# Patient Record
Sex: Female | Born: 1960 | Race: White | Hispanic: No | Marital: Married | Smoking: Former smoker
Health system: Southern US, Community
[De-identification: ages and names within clinical notes are randomized; demographics above are authoritative.]

## PROBLEM LIST (undated history)

## (undated) DIAGNOSIS — D689 Coagulation defect, unspecified: Secondary | ICD-10-CM

## (undated) DIAGNOSIS — E785 Hyperlipidemia, unspecified: Secondary | ICD-10-CM

## (undated) DIAGNOSIS — T7840XA Allergy, unspecified, initial encounter: Secondary | ICD-10-CM

## (undated) DIAGNOSIS — Z7901 Long term (current) use of anticoagulants: Secondary | ICD-10-CM

## (undated) DIAGNOSIS — K219 Gastro-esophageal reflux disease without esophagitis: Secondary | ICD-10-CM

## (undated) DIAGNOSIS — J45909 Unspecified asthma, uncomplicated: Secondary | ICD-10-CM

## (undated) DIAGNOSIS — M1990 Unspecified osteoarthritis, unspecified site: Secondary | ICD-10-CM

## (undated) DIAGNOSIS — R011 Cardiac murmur, unspecified: Secondary | ICD-10-CM

## (undated) DIAGNOSIS — Z972 Presence of dental prosthetic device (complete) (partial): Secondary | ICD-10-CM

## (undated) DIAGNOSIS — Z974 Presence of external hearing-aid: Secondary | ICD-10-CM

## (undated) DIAGNOSIS — I2699 Other pulmonary embolism without acute cor pulmonale: Secondary | ICD-10-CM

## (undated) DIAGNOSIS — H7293 Unspecified perforation of tympanic membrane, bilateral: Secondary | ICD-10-CM

## (undated) DIAGNOSIS — Z803 Family history of malignant neoplasm of breast: Secondary | ICD-10-CM

## (undated) DIAGNOSIS — I82409 Acute embolism and thrombosis of unspecified deep veins of unspecified lower extremity: Secondary | ICD-10-CM

## (undated) HISTORY — DX: Allergy, unspecified, initial encounter: T78.40XA

## (undated) HISTORY — DX: Unspecified perforation of tympanic membrane, bilateral: H72.93

## (undated) HISTORY — PX: FOOT SURGERY: SHX648

## (undated) HISTORY — DX: Hyperlipidemia, unspecified: E78.5

## (undated) HISTORY — DX: Long term (current) use of anticoagulants: Z79.01

## (undated) HISTORY — PX: COLONOSCOPY: SHX174

## (undated) HISTORY — PX: UPPER GASTROINTESTINAL ENDOSCOPY: SHX188

## (undated) HISTORY — PX: ESOPHAGOGASTRODUODENOSCOPY (EGD) WITH ESOPHAGEAL DILATION: SHX5812

## (undated) HISTORY — DX: Presence of dental prosthetic device (complete) (partial): Z97.2

## (undated) HISTORY — DX: Unspecified asthma, uncomplicated: J45.909

## (undated) HISTORY — DX: Unspecified osteoarthritis, unspecified site: M19.90

## (undated) HISTORY — PX: APPENDECTOMY: SHX54

## (undated) HISTORY — DX: Gastro-esophageal reflux disease without esophagitis: K21.9

## (undated) HISTORY — DX: Acute embolism and thrombosis of unspecified deep veins of unspecified lower extremity: I82.409

## (undated) HISTORY — PX: WRIST SURGERY: SHX841

## (undated) HISTORY — DX: Family history of malignant neoplasm of breast: Z80.3

## (undated) HISTORY — DX: Coagulation defect, unspecified: D68.9

## (undated) HISTORY — DX: Cardiac murmur, unspecified: R01.1

## (undated) HISTORY — PX: ABDOMINAL HYSTERECTOMY: SHX81

---

## 2009-11-06 HISTORY — PX: KNEE SURGERY: SHX244

## 2013-08-26 ENCOUNTER — Encounter: Payer: Self-pay | Admitting: General Practice

## 2013-08-26 ENCOUNTER — Ambulatory Visit (INDEPENDENT_AMBULATORY_CARE_PROVIDER_SITE_OTHER): Payer: Federal, State, Local not specified - PPO | Admitting: General Practice

## 2013-08-26 VITALS — BP 147/84 | HR 88 | Temp 97.3°F | Ht 69.5 in | Wt 176.0 lb

## 2013-08-26 DIAGNOSIS — Z7901 Long term (current) use of anticoagulants: Secondary | ICD-10-CM

## 2013-08-26 DIAGNOSIS — Z0000 Encounter for general adult medical examination without abnormal findings: Secondary | ICD-10-CM

## 2013-08-26 DIAGNOSIS — E785 Hyperlipidemia, unspecified: Secondary | ICD-10-CM

## 2013-08-26 NOTE — Progress Notes (Signed)
  Subjective:    Patient ID: Whitney Peters, female    DOB: [birth year], 52 y.o.   MRN: [id number]  HPI Patient presents today to establish care. She recently relocated from [place] to [place], [place] two weeks ago. She reports a history of asthma, hyperlipidemia, GERD, and pulmonary embolus. She reports currently taking warfarin 5mg  daily (goal 2-2.5), pitavastatin, zantac. Denies any abnormal bleeding.    Review of Systems  Constitutional: Negative for fever and chills.  Respiratory: Negative for cough, chest tightness, shortness of breath and wheezing.   Cardiovascular: Negative for chest pain and palpitations.  Gastrointestinal: Negative for abdominal pain, diarrhea, constipation and blood in stool.  Genitourinary: Negative for dysuria, hematuria and difficulty urinating.  Musculoskeletal: Negative for back pain, neck pain and neck stiffness.  Neurological: Negative for dizziness, weakness and headaches.       Objective:   Physical Exam  Constitutional: She is oriented to person, place, and time. She appears well-developed and well-nourished.  HENT:  Head: Normocephalic and atraumatic.  Right Ear: External ear normal.  Left Ear: External ear normal.  Nose: Nose normal.  Mouth/Throat: Oropharynx is clear and moist.  Eyes: EOM are normal. Pupils are equal, round, and reactive to light.  Neck: Normal range of motion. Neck supple. No thyromegaly present.  Cardiovascular: Normal rate, regular rhythm and normal heart sounds.   Pulmonary/Chest: Effort normal and breath sounds normal. No respiratory distress. She exhibits no tenderness.  Abdominal: Soft. Bowel sounds are normal. She exhibits no distension. There is no tenderness.  Musculoskeletal: She exhibits no edema and no tenderness.  Lymphadenopathy:    She has no cervical adenopathy.  Neurological: She is alert and oriented to person, place, and time.  Skin: Skin is warm and dry.  Psychiatric: She has a normal mood and  affect.          Assessment & Plan:  1. Anticoagulated on warfarin  - POCT INR - POCT INR; Future  2. Hyperlipidemia  - CMP14+EGFR; Future - NMR, lipoprofile; Future  3. Annual physical exam  - POCT CBC; Future -RTO in 1 week for labs -Patient verbalized understanding Coralie Keens, FNP-C

## 2013-09-02 ENCOUNTER — Ambulatory Visit (INDEPENDENT_AMBULATORY_CARE_PROVIDER_SITE_OTHER): Payer: Federal, State, Local not specified - PPO | Admitting: Pharmacist Clinician (PhC)/ Clinical Pharmacy Specialist

## 2013-09-02 DIAGNOSIS — Z7901 Long term (current) use of anticoagulants: Secondary | ICD-10-CM

## 2013-09-02 LAB — POCT INR: INR: 2.2

## 2013-09-15 ENCOUNTER — Telehealth: Payer: Self-pay | Admitting: Pharmacist

## 2013-09-15 ENCOUNTER — Other Ambulatory Visit: Payer: Federal, State, Local not specified - PPO

## 2013-09-15 ENCOUNTER — Ambulatory Visit (INDEPENDENT_AMBULATORY_CARE_PROVIDER_SITE_OTHER): Payer: Federal, State, Local not specified - PPO | Admitting: Pharmacist

## 2013-09-15 DIAGNOSIS — Z86711 Personal history of pulmonary embolism: Secondary | ICD-10-CM

## 2013-09-15 DIAGNOSIS — Z7901 Long term (current) use of anticoagulants: Secondary | ICD-10-CM

## 2013-09-15 DIAGNOSIS — I2699 Other pulmonary embolism without acute cor pulmonale: Secondary | ICD-10-CM | POA: Insufficient documentation

## 2013-09-15 MED ORDER — WARFARIN SODIUM 5 MG PO TABS: ORAL_TABLET | ORAL | Status: DC

## 2013-09-15 NOTE — Telephone Encounter (Signed)
Patient came in today for protime

## 2013-09-15 NOTE — Telephone Encounter (Signed)
Ok for patient to come in today or reschedule when she returns.

## 2013-09-15 NOTE — Progress Notes (Signed)
Patient came in for labs only.

## 2013-09-15 NOTE — Patient Instructions (Signed)
Anticoagulation Dose Instructions as of 09/15/2013     Glynis Smiles Tue Wed Thu Fri Sat   New Dose 5 mg 7.5 mg 5 mg 5 mg 5 mg 7.5 mg 5 mg    Description       Increase to 1 and 1/2 tablets on mondays and fridays and 1 tablet all other days.      INR was 1.6 today (too thick)

## 2013-10-09 ENCOUNTER — Ambulatory Visit (INDEPENDENT_AMBULATORY_CARE_PROVIDER_SITE_OTHER): Payer: Federal, State, Local not specified - PPO | Admitting: Pharmacist

## 2013-10-09 ENCOUNTER — Encounter (INDEPENDENT_AMBULATORY_CARE_PROVIDER_SITE_OTHER): Payer: Self-pay

## 2013-10-09 DIAGNOSIS — I2699 Other pulmonary embolism without acute cor pulmonale: Secondary | ICD-10-CM

## 2013-10-09 DIAGNOSIS — Z86711 Personal history of pulmonary embolism: Secondary | ICD-10-CM

## 2013-10-09 LAB — POCT INR: INR: 2.1

## 2013-10-09 NOTE — Patient Instructions (Signed)
Anticoagulation Dose Instructions as of 10/09/2013     Whitney Peters Tue Wed Thu Fri Sat   New Dose 5 mg 7.5 mg 5 mg 5 mg 5 mg 7.5 mg 5 mg    Description       Continue 1 and 1/2  On mondays and fridays and 1 tablet all other days.      INR was 2.1 today

## 2013-11-10 ENCOUNTER — Ambulatory Visit (INDEPENDENT_AMBULATORY_CARE_PROVIDER_SITE_OTHER): Payer: Federal, State, Local not specified - PPO | Admitting: Pharmacist

## 2013-11-10 DIAGNOSIS — I2699 Other pulmonary embolism without acute cor pulmonale: Secondary | ICD-10-CM

## 2013-11-10 DIAGNOSIS — Z86711 Personal history of pulmonary embolism: Secondary | ICD-10-CM

## 2013-11-10 LAB — POCT INR: INR: 1.9

## 2013-11-10 NOTE — Patient Instructions (Signed)
Anticoagulation Dose Instructions as of 11/10/2013     Glynis SmilesSun Mon Tue Wed Thu Fri Sat   New Dose 5 mg 7.5 mg 5 mg 5 mg 5 mg 7.5 mg 5 mg    Description       Take 2 tablet today then resume 1 and 1/2 tablets on mondays and fridays and 1 tablet all other days.      INR was 1.9 today

## 2013-11-20 ENCOUNTER — Ambulatory Visit (INDEPENDENT_AMBULATORY_CARE_PROVIDER_SITE_OTHER): Payer: Federal, State, Local not specified - PPO | Admitting: *Deleted

## 2013-11-20 DIAGNOSIS — Z23 Encounter for immunization: Secondary | ICD-10-CM

## 2013-12-16 ENCOUNTER — Ambulatory Visit (INDEPENDENT_AMBULATORY_CARE_PROVIDER_SITE_OTHER): Payer: Federal, State, Local not specified - PPO | Admitting: Pharmacist Clinician (PhC)/ Clinical Pharmacy Specialist

## 2013-12-16 DIAGNOSIS — I2699 Other pulmonary embolism without acute cor pulmonale: Secondary | ICD-10-CM

## 2013-12-16 DIAGNOSIS — Z86711 Personal history of pulmonary embolism: Secondary | ICD-10-CM

## 2013-12-16 LAB — POCT INR: INR: 1.3

## 2014-01-05 ENCOUNTER — Encounter: Payer: Self-pay | Admitting: Family Medicine

## 2014-01-06 ENCOUNTER — Ambulatory Visit (INDEPENDENT_AMBULATORY_CARE_PROVIDER_SITE_OTHER): Payer: Federal, State, Local not specified - PPO | Admitting: Pharmacist Clinician (PhC)/ Clinical Pharmacy Specialist

## 2014-01-06 DIAGNOSIS — Z86711 Personal history of pulmonary embolism: Secondary | ICD-10-CM

## 2014-01-06 DIAGNOSIS — I2699 Other pulmonary embolism without acute cor pulmonale: Secondary | ICD-10-CM

## 2014-01-06 NOTE — Progress Notes (Signed)
Patient stopped warfarin end of February due to significant hair loss and thinning.  Date of PE was September 16th and estimated stop date was 3/16 for warfarin.  Patient is off warfarin and not willing to re-start it for the remaining two weeks.

## 2014-01-06 NOTE — Addendum Note (Signed)
Addended by: Chari ManningBOZOVICH, Jenavive Lamboy on: 01/06/2014 12:41 PM   Modules accepted: Level of Service

## 2014-01-22 ENCOUNTER — Encounter: Payer: Self-pay | Admitting: Nurse Practitioner

## 2014-01-22 ENCOUNTER — Ambulatory Visit (INDEPENDENT_AMBULATORY_CARE_PROVIDER_SITE_OTHER): Payer: Federal, State, Local not specified - PPO | Admitting: Nurse Practitioner

## 2014-01-22 ENCOUNTER — Telehealth: Payer: Self-pay | Admitting: Nurse Practitioner

## 2014-01-22 VITALS — BP 125/84 | HR 99 | Temp 98.5°F | Wt 183.6 lb

## 2014-01-22 DIAGNOSIS — R102 Pelvic and perineal pain: Secondary | ICD-10-CM

## 2014-01-22 DIAGNOSIS — K219 Gastro-esophageal reflux disease without esophagitis: Secondary | ICD-10-CM | POA: Insufficient documentation

## 2014-01-22 DIAGNOSIS — Z86711 Personal history of pulmonary embolism: Secondary | ICD-10-CM | POA: Insufficient documentation

## 2014-01-22 DIAGNOSIS — N390 Urinary tract infection, site not specified: Secondary | ICD-10-CM

## 2014-01-22 DIAGNOSIS — E559 Vitamin D deficiency, unspecified: Secondary | ICD-10-CM | POA: Insufficient documentation

## 2014-01-22 DIAGNOSIS — N949 Unspecified condition associated with female genital organs and menstrual cycle: Secondary | ICD-10-CM

## 2014-01-22 DIAGNOSIS — E785 Hyperlipidemia, unspecified: Secondary | ICD-10-CM | POA: Insufficient documentation

## 2014-01-22 LAB — POCT UA - MICROSCOPIC ONLY
CASTS, UR, LPF, POC: NEGATIVE
CRYSTALS, UR, HPF, POC: NEGATIVE
Mucus, UA: NEGATIVE
YEAST UA: NEGATIVE

## 2014-01-22 LAB — POCT URINALYSIS DIPSTICK
Bilirubin, UA: NEGATIVE
Glucose, UA: NEGATIVE
Ketones, UA: NEGATIVE
NITRITE UA: NEGATIVE
PH UA: 6
Spec Grav, UA: 1.025
UROBILINOGEN UA: NEGATIVE

## 2014-01-22 MED ORDER — CIPROFLOXACIN HCL 500 MG PO TABS: 500.0000 mg | ORAL_TABLET | Freq: Two times a day (BID) | ORAL | Status: DC

## 2014-01-22 NOTE — Progress Notes (Signed)
Subjective:    Patient ID: Whitney Peters, female    DOB: 12-21-1960, 53 y.o.   MRN: [id number]  Pelvic Pain The patient's primary symptoms include a genital odor and pelvic pain. The patient's pertinent negatives include no vaginal discharge. This is a new problem. The current episode started yesterday. The problem occurs intermittently. The problem has been gradually worsening. The pain is severe. The problem affects both sides. She is not pregnant. Associated symptoms include discolored urine, dysuria, a fever, frequency and urgency. Pertinent negatives include no constipation, diarrhea, nausea or vomiting. Nothing aggravates the symptoms. She has tried nothing for the symptoms. Her sexual activity is non-contributory to the current illness. No, her partner does not have an STD. She is postmenopausal.      Review of Systems  Constitutional: Positive for fever.  Gastrointestinal: Positive for abdominal distention. Negative for nausea, vomiting, diarrhea and constipation.  Genitourinary: Positive for dysuria, urgency, frequency and pelvic pain. Negative for vaginal discharge.  All other systems reviewed and are negative.       Objective:   Physical Exam  Constitutional: She is oriented to person, place, and time. She appears well-developed and well-nourished.  Cardiovascular: Normal rate, regular rhythm and normal heart sounds.   Pulmonary/Chest: Effort normal and breath sounds normal.  Abdominal: Soft. She exhibits no distension and no mass. There is tenderness (diffuse suprpubic pain on palpation). There is no rebound and no guarding.  Neurological: She is alert and oriented to person, place, and time.  Skin: Skin is warm and dry.  Psychiatric: She has a normal mood and affect. Her behavior is normal. Judgment and thought content normal.    BP 125/84  Pulse 99  Temp(Src) 98.5 F (36.9 C) (Oral)  Wt 183 lb 9.6 oz (83.28 kg)    Results for orders placed in visit on 01/22/14   POCT URINALYSIS DIPSTICK      Result Value Ref Range   Color, UA gold     Clarity, UA clear     Glucose, UA negative     Bilirubin, UA negative     Ketones, UA negative     Spec Grav, UA 1.025     Blood, UA large     pH, UA 6.0     Protein, UA 4+     Urobilinogen, UA negative     Nitrite, UA negative     Leukocytes, UA large (3+)    POCT UA - MICROSCOPIC ONLY      Result Value Ref Range   WBC, Ur, HPF, POC 80-150     RBC, urine, microscopic 5-8     Bacteria, U Microscopic large     Mucus, UA negative     Epithelial cells, urine per micros moderate     Crystals, Ur, HPF, POC negative     Casts, Ur, LPF, POC negative     Yeast, UA negative          Assessment & Plan:   1. Pelvic pain   2. UTI (urinary tract infection)    Meds ordered this encounter  Medications  . ciprofloxacin (CIPRO) 500 MG tablet    Sig: Take 1 tablet (500 mg total) by mouth 2 (two) times daily.    Dispense:  14 tablet    Refill:  0    Order Specific Question:  Supervising Provider    Answer:  Deborra MedinaMOORE, DONALD W [[id number]]   Force fluids AZO over the counter X2 days RTO prn Culture pending Mary-Margaret Daphine DeutscherMartin, FNP

## 2014-01-22 NOTE — Telephone Encounter (Signed)
appt given for today 

## 2014-01-22 NOTE — Patient Instructions (Signed)
Urinary Tract Infection  Urinary tract infections (UTIs) can develop anywhere along your urinary tract. Your urinary tract is your body's drainage system for removing wastes and extra water. Your urinary tract includes two kidneys, two ureters, a bladder, and a urethra. Your kidneys are a pair of bean-shaped organs. Each kidney is about the size of your fist. They are located below your ribs, one on each side of your spine.  CAUSES  Infections are caused by microbes, which are microscopic organisms, including fungi, viruses, and bacteria. These organisms are so small that they can only be seen through a microscope. Bacteria are the microbes that most commonly cause UTIs.  SYMPTOMS   Symptoms of UTIs may vary by age and gender of the patient and by the location of the infection. Symptoms in young women typically include a frequent and intense urge to urinate and a painful, burning feeling in the bladder or urethra during urination. Older women and men are more likely to be tired, shaky, and weak and have muscle aches and abdominal pain. A fever may mean the infection is in your kidneys. Other symptoms of a kidney infection include pain in your back or sides below the ribs, nausea, and vomiting.  DIAGNOSIS  To diagnose a UTI, your caregiver will ask you about your symptoms. Your caregiver also will ask to provide a urine sample. The urine sample will be tested for bacteria and white blood cells. White blood cells are made by your body to help fight infection.  TREATMENT   Typically, UTIs can be treated with medication. Because most UTIs are caused by a bacterial infection, they usually can be treated with the use of antibiotics. The choice of antibiotic and length of treatment depend on your symptoms and the type of bacteria causing your infection.  HOME CARE INSTRUCTIONS   If you were prescribed antibiotics, take them exactly as your caregiver instructs you. Finish the medication even if you feel better after you  have only taken some of the medication.   Drink enough water and fluids to keep your urine clear or pale yellow.   Avoid caffeine, tea, and carbonated beverages. They tend to irritate your bladder.   Empty your bladder often. Avoid holding urine for long periods of time.   Empty your bladder before and after sexual intercourse.   After a bowel movement, women should cleanse from front to back. Use each tissue only once.  SEEK MEDICAL CARE IF:    You have back pain.   You develop a fever.   Your symptoms do not begin to resolve within 3 days.  SEEK IMMEDIATE MEDICAL CARE IF:    You have severe back pain or lower abdominal pain.   You develop chills.   You have nausea or vomiting.   You have continued burning or discomfort with urination.  MAKE SURE YOU:    Understand these instructions.   Will watch your condition.   Will get help right away if you are not doing well or get worse.  Document Released: 08/02/2005 Document Revised: 04/23/2012 Document Reviewed: 12/01/2011  ExitCare Patient Information 2014 [employer].

## 2014-01-26 ENCOUNTER — Telehealth: Payer: Self-pay | Admitting: Nurse Practitioner

## 2014-01-26 ENCOUNTER — Encounter: Payer: Self-pay | Admitting: Nurse Practitioner

## 2014-01-26 NOTE — Telephone Encounter (Signed)
Up front patient aware 

## 2014-01-26 NOTE — Telephone Encounter (Signed)
Fax letter to school

## 2014-02-02 ENCOUNTER — Ambulatory Visit (INDEPENDENT_AMBULATORY_CARE_PROVIDER_SITE_OTHER): Payer: Federal, State, Local not specified - PPO | Admitting: Family Medicine

## 2014-02-02 VITALS — BP 110/72 | HR 76 | Temp 97.5°F | Ht 69.5 in | Wt 184.0 lb

## 2014-02-02 DIAGNOSIS — J209 Acute bronchitis, unspecified: Secondary | ICD-10-CM

## 2014-02-02 MED ORDER — HYDROCODONE-HOMATROPINE 5-1.5 MG/5ML PO SYRP: 5.0000 mL | ORAL_SOLUTION | Freq: Three times a day (TID) | ORAL | Status: DC | PRN

## 2014-02-02 MED ORDER — METHYLPREDNISOLONE (PAK) 4 MG PO TABS: ORAL_TABLET | ORAL | Status: DC

## 2014-02-02 MED ORDER — AMOXICILLIN 875 MG PO TABS: 875.0000 mg | ORAL_TABLET | Freq: Two times a day (BID) | ORAL | Status: DC

## 2014-02-02 NOTE — Progress Notes (Signed)
   Subjective:    Patient ID: Garen LahLoretta Masri, female    DOB: 1961-09-20, 53 y.o.   MRN: [id number]  HPI  This 53 y.o. female presents for evaluation of persistent cough, right maxillary sinus pain, Mucopurulent productive cough and uri sx's.  Review of Systems C/o cough and uri sx's   No chest pain, SOB, HA, dizziness, vision change, N/V, diarrhea, constipation, dysuria, urinary urgency or frequency, myalgias, arthralgias or rash.  Objective:   Physical Exam  Vital signs noted  Well developed well nourished female.  HEENT - Head atraumatic Normocephalic                Eyes - PERRLA, Conjuctiva - clear Sclera- Clear EOMI                Ears - EAC's Wnl TM's Wnl Gross Hearing WNL                Throat - oropharanx wnl Respiratory - Lungs with expiratory wheezes Cardiac - RRR S1 and S2 without murmur GI - Abdomen soft Nontender and bowel sounds active x 4 Extremities - No edema. Neuro - Grossly intact.      Assessment & Plan:  Acute bronchitis - Plan: amoxicillin (AMOXIL) 875 MG tablet, methylPREDNIsolone (MEDROL DOSPACK) 4 MG tablet, HYDROcodone-homatropine (HYCODAN) 5-1.5 MG/5ML syrup  Push po fluids, rest, tylenol and motrin otc prn as directed for fever, arthralgias, and myalgias.  Follow up prn if sx's continue or persist.  Deatra CanterWilliam J Oxford FNP

## 2014-04-06 DIAGNOSIS — I2699 Other pulmonary embolism without acute cor pulmonale: Secondary | ICD-10-CM

## 2014-04-06 HISTORY — DX: Other pulmonary embolism without acute cor pulmonale: I26.99

## 2014-04-10 ENCOUNTER — Ambulatory Visit (INDEPENDENT_AMBULATORY_CARE_PROVIDER_SITE_OTHER): Payer: Federal, State, Local not specified - PPO | Admitting: Family Medicine

## 2014-04-10 ENCOUNTER — Ambulatory Visit (HOSPITAL_COMMUNITY)
Admission: RE | Admit: 2014-04-10 | Discharge: 2014-04-10 | Disposition: A | Discharge status: Home / Self Care | Payer: Federal, State, Local not specified - PPO | Source: Ambulatory Visit | Attending: Family Medicine | Admitting: Family Medicine

## 2014-04-10 ENCOUNTER — Other Ambulatory Visit: Payer: Self-pay | Admitting: Family Medicine

## 2014-04-10 ENCOUNTER — Encounter: Payer: Self-pay | Admitting: Family Medicine

## 2014-04-10 VITALS — BP 114/73 | HR 66 | Temp 97.8°F | Ht 69.5 in | Wt 183.8 lb

## 2014-04-10 DIAGNOSIS — I82819 Embolism and thrombosis of superficial veins of unspecified lower extremities: Secondary | ICD-10-CM | POA: Insufficient documentation

## 2014-04-10 DIAGNOSIS — M7989 Other specified soft tissue disorders: Secondary | ICD-10-CM

## 2014-04-10 DIAGNOSIS — I82409 Acute embolism and thrombosis of unspecified deep veins of unspecified lower extremity: Secondary | ICD-10-CM

## 2014-04-10 MED ORDER — ENOXAPARIN SODIUM 100 MG/ML ~~LOC~~ SOLN: 85.0000 mg | Freq: Two times a day (BID) | SUBCUTANEOUS | Status: DC

## 2014-04-10 MED ORDER — ENOXAPARIN SODIUM 150 MG/ML ~~LOC~~ SOLN: 1.0000 mg/kg | Freq: Two times a day (BID) | SUBCUTANEOUS | Status: DC

## 2014-04-10 MED ORDER — WARFARIN SODIUM 5 MG PO TABS: 5.0000 mg | ORAL_TABLET | Freq: Every day | ORAL | Status: DC

## 2014-04-10 NOTE — Progress Notes (Signed)
   Subjective:    Patient ID: Whitney Peters, female    DOB: 1961-02-01, 53 y.o.   MRN: [id number]  HPI    Review of Systems     Objective:   Physical Exam        Assessment & Plan:  Left leg swelling - Plan: Lower Extremity Venous Duplex Left, enoxaparin (LOVENOX) 150 MG/ML injection, warfarin (COUMADIN) 5 MG tablet  DVT (deep venous thrombosis) - Plan: enoxaparin (LOVENOX) 150 MG/ML injection, warfarin (COUMADIN) 5 MG tablet  Follow up in 2 days and check PTINR.  Discussed with patient she needs to not do any prolonged walking or standing.  Deatra Canter FNP

## 2014-04-10 NOTE — Addendum Note (Signed)
Addended by: Deatra Canter on: 04/10/2014 03:09 PM   Modules accepted: Orders

## 2014-04-10 NOTE — Progress Notes (Signed)
   Subjective:    Patient ID: Whitney Peters, female    DOB: [birth year], 53 y.o.   MRN: [id number]  HPI This 53 y.o. female presents for evaluation of left lower extremity discomfort and swelling.  She has Noticed for the last few days she is hurting in the back of her left thigh and knee to her left ankle. She has hx of PE.   Review of Systems C/o left lower extremity discomfort and swelling   No chest pain, SOB, HA, dizziness, vision change, N/V, diarrhea, constipation, dysuria, urinary urgency or frequency, myalgias, arthralgias or rash.  Objective:   Physical Exam Vital signs noted  Well developed well nourished female.  HEENT - Head atraumatic Normocephalic                Eyes - PERRLA, Conjuctiva - clear Sclera- Clear EOMI                Ears - EAC's Wnl TM's Wnl Gross Hearing WNL                Throat - oropharanx wnl Respiratory - Lungs CTA bilateral Cardiac - RRR S1 and S2 without murmur Extremities - Pain to palp left lower extremity       Assessment & Plan:  Left leg swelling - Plan: Lower Extremity Venous Duplex Left STAT venous doppler US  Deatra Canter FNP

## 2014-04-10 NOTE — Addendum Note (Signed)
Addended by: Gwenith Daily on: 04/10/2014 04:05 PM   Modules accepted: Orders

## 2014-04-13 ENCOUNTER — Ambulatory Visit (INDEPENDENT_AMBULATORY_CARE_PROVIDER_SITE_OTHER): Payer: Federal, State, Local not specified - PPO | Admitting: Family Medicine

## 2014-04-13 ENCOUNTER — Encounter: Payer: Self-pay | Admitting: Family Medicine

## 2014-04-13 VITALS — BP 132/68 | HR 69 | Temp 98.0°F | Ht 69.5 in | Wt 184.0 lb

## 2014-04-13 DIAGNOSIS — I82409 Acute embolism and thrombosis of unspecified deep veins of unspecified lower extremity: Secondary | ICD-10-CM

## 2014-04-13 LAB — POCT INR: INR: 1.6

## 2014-04-13 MED ORDER — HYDROCODONE-ACETAMINOPHEN 5-325 MG PO TABS: 1.0000 | ORAL_TABLET | Freq: Four times a day (QID) | ORAL | Status: DC | PRN

## 2014-04-13 NOTE — Progress Notes (Signed)
   Subjective:    Patient ID: Whitney Peters, female    DOB: October 27, 1961, 53 y.o.   MRN: [id number]  HPI  This 53 y.o. female presents for evaluation of Left femoral distal vein thrombus.  She has been having some moderate pain in her left leg and knee area.  She has been on coumadin for 2 days and has been getting lovenox shots by her husband twice a day.  She has hx of PE.  She has not had any Anticoagulation studies and was placed on anticoags before this was done.  She is taking coumadin 5mg  po qd. She is here for ptinr and visit.  Review of Systems C/o left leg pain   No chest pain, SOB, HA, dizziness, vision change, N/V, diarrhea, constipation, dysuria, urinary urgency or frequency, myalgias, arthralgias or rash.  Objective:   Physical Exam Vital signs noted  Well developed well nourished female.  HEENT - Head atraumatic Normocephalic                Eyes - PERRLA, Conjuctiva - clear Sclera- Clear EOMI                Ears - EAC's Wnl TM's Wnl Gross Hearing WNL                Nose - Nares patent                 Throat - oropharanx wnl Respiratory - Lungs CTA bilateral Cardiac - RRR S1 and S2 without murmur GI - Abdomen soft Nontender and bowel sounds active x 4 Extremities - No edema. Neuro - Grossly intact.       Assessment & Plan:  DVT (deep venous thrombosis) - Plan: POCT INR, HYDROcodone-acetaminophen (NORCO) 5-325 MG per tablet, Ambulatory referral to Hematology Increase coumadin to 2 po today and tomorrow and follow up Wednesday for ptinr.  Deatra Canter FNP

## 2014-04-13 NOTE — Patient Instructions (Signed)
Anticoagulation Dose Instructions as of 04/13/2014     Whitney Peters Tue Wed Thu Fri Sat   New Dose 5 mg 5 mg 5 mg 5 mg 5 mg 5 mg 5 mg    Description       Increase dose of coumadin to 10mg  or 2 po qd and follow up  In 2 days for ptinr

## 2014-04-15 ENCOUNTER — Ambulatory Visit (INDEPENDENT_AMBULATORY_CARE_PROVIDER_SITE_OTHER): Payer: Federal, State, Local not specified - PPO | Admitting: Family Medicine

## 2014-04-15 VITALS — BP 104/70 | HR 70 | Temp 97.1°F | Wt 183.4 lb

## 2014-04-15 DIAGNOSIS — I82409 Acute embolism and thrombosis of unspecified deep veins of unspecified lower extremity: Secondary | ICD-10-CM

## 2014-04-15 DIAGNOSIS — Z86711 Personal history of pulmonary embolism: Secondary | ICD-10-CM

## 2014-04-15 LAB — POCT INR: INR: 4.9

## 2014-04-15 NOTE — Progress Notes (Signed)
   Subjective:    Patient ID: Whitney Peters, female    DOB: 05-23-1961, 53 y.o.   MRN: [id number]  HPI  This 53 y.o. female presents for evaluation of folow up.  She has right DVT and is taking coumadin. Her coumadin was raised from 5mg  to 10mg  po qd for the last 2 days.  Her INR went from 1.6 to 4.9.  Review of Systems    No chest pain, SOB, HA, dizziness, vision change, N/V, diarrhea, constipation, dysuria, urinary urgency or frequency, myalgias, arthralgias or rash.  Objective:   Physical Exam  Vital signs noted  Well developed well nourished female.  HEENT - Head atraumatic Normocephalic                Eyes - PERRLA, Conjuctiva - clear Sclera- Clear EOMI                Ears - EAC's Wnl TM's Wnl Gross Hearing WNL                Nose - Nares patent                 Throat - oropharanx wnl Respiratory - Lungs CTA bilateral Cardiac - RRR S1 and S2 without murmur GI - Abdomen soft Nontender and bowel sounds active x 4.      Assessment & Plan:  DVT (deep venous thrombosis)  History of pulmonary embolism - Plan: POCT INR  Hold coumadin for a day and then resume at 5mg  po qd and follow up in one week for INR with Tammy Eckerd.  Deatra Canter FNP

## 2014-04-15 NOTE — Patient Instructions (Signed)
Anticoagulation Dose Instructions as of 04/15/2014     Whitney Peters Tue Wed Thu Fri Sat   New Dose 5 mg 5 mg 5 mg Hold 5 mg 5 mg 5 mg    Description       Hold coumadin today and then decrease coumadin from 10mg  po qd to 5mg  po qd and stop lovenox and get PTINR in one week

## 2014-04-21 ENCOUNTER — Ambulatory Visit (INDEPENDENT_AMBULATORY_CARE_PROVIDER_SITE_OTHER): Payer: Federal, State, Local not specified - PPO | Admitting: Pharmacist Clinician (PhC)/ Clinical Pharmacy Specialist

## 2014-04-21 DIAGNOSIS — Z86711 Personal history of pulmonary embolism: Secondary | ICD-10-CM

## 2014-04-21 DIAGNOSIS — I82409 Acute embolism and thrombosis of unspecified deep veins of unspecified lower extremity: Secondary | ICD-10-CM

## 2014-04-21 LAB — POCT INR: INR: 3

## 2014-04-27 ENCOUNTER — Ambulatory Visit: Payer: Federal, State, Local not specified - PPO | Admitting: Family

## 2014-04-28 ENCOUNTER — Ambulatory Visit (INDEPENDENT_AMBULATORY_CARE_PROVIDER_SITE_OTHER): Payer: Federal, State, Local not specified - PPO | Admitting: Pharmacist Clinician (PhC)/ Clinical Pharmacy Specialist

## 2014-04-28 DIAGNOSIS — Z86711 Personal history of pulmonary embolism: Secondary | ICD-10-CM

## 2014-04-28 LAB — POCT INR: INR: 3.5

## 2014-04-29 ENCOUNTER — Encounter (HOSPITAL_COMMUNITY): Payer: Federal, State, Local not specified - PPO | Attending: Hematology and Oncology

## 2014-04-29 ENCOUNTER — Encounter (HOSPITAL_COMMUNITY): Payer: Self-pay

## 2014-04-29 VITALS — BP 127/87 | HR 87 | Temp 97.6°F | Resp 20 | Wt 183.9 lb

## 2014-04-29 DIAGNOSIS — I82402 Acute embolism and thrombosis of unspecified deep veins of left lower extremity: Secondary | ICD-10-CM | POA: Insufficient documentation

## 2014-04-29 DIAGNOSIS — Z86711 Personal history of pulmonary embolism: Secondary | ICD-10-CM | POA: Insufficient documentation

## 2014-04-29 DIAGNOSIS — K219 Gastro-esophageal reflux disease without esophagitis: Secondary | ICD-10-CM | POA: Insufficient documentation

## 2014-04-29 DIAGNOSIS — Z7901 Long term (current) use of anticoagulants: Secondary | ICD-10-CM | POA: Diagnosis not present

## 2014-04-29 DIAGNOSIS — I82409 Acute embolism and thrombosis of unspecified deep veins of unspecified lower extremity: Secondary | ICD-10-CM | POA: Diagnosis present

## 2014-04-29 DIAGNOSIS — E559 Vitamin D deficiency, unspecified: Secondary | ICD-10-CM

## 2014-04-29 LAB — COMPREHENSIVE METABOLIC PANEL
ALBUMIN: 3.6 g/dL (ref 3.5–5.2)
ALK PHOS: 110 U/L (ref 39–117)
ALT: 25 U/L (ref 0–35)
AST: 21 U/L (ref 0–37)
BILIRUBIN TOTAL: 0.2 mg/dL — AB (ref 0.3–1.2)
BUN: 7 mg/dL (ref 6–23)
CHLORIDE: 103 meq/L (ref 96–112)
CO2: 23 mEq/L (ref 19–32)
Calcium: 9.4 mg/dL (ref 8.4–10.5)
Creatinine, Ser: 0.78 mg/dL (ref 0.50–1.10)
GFR calc Af Amer: >90 mL/min (ref >90)
GFR calc non Af Amer: >90 mL/min (ref >90)
Glucose, Bld: 139 mg/dL — ABNORMAL HIGH (ref 70–99)
POTASSIUM: 3.9 meq/L (ref 3.7–5.3)
SODIUM: 139 meq/L (ref 137–147)
Total Protein: 7.4 g/dL (ref 6.0–8.3)

## 2014-04-29 LAB — CBC WITH DIFFERENTIAL/PLATELET
BASOS ABS: 0.1 10*3/uL (ref 0.0–0.1)
BASOS PCT: 1 % (ref 0–1)
Eosinophils Absolute: 0.3 10*3/uL (ref 0.0–0.7)
Eosinophils Relative: 4 % (ref 0–5)
HCT: 43.3 % (ref 36.0–46.0)
Hemoglobin: 14.9 g/dL (ref 12.0–15.0)
Lymphocytes Relative: 34 % (ref 12–46)
Lymphs Abs: 2.8 10*3/uL (ref 0.7–4.0)
MCH: 31.2 pg (ref 26.0–34.0)
MCHC: 34.4 g/dL (ref 30.0–36.0)
MCV: 90.6 fL (ref 78.0–100.0)
MONOS PCT: 6 % (ref 3–12)
Monocytes Absolute: 0.5 10*3/uL (ref 0.1–1.0)
NEUTROS ABS: 4.7 10*3/uL (ref 1.7–7.7)
NEUTROS PCT: 55 % (ref 43–77)
PLATELETS: 344 10*3/uL (ref 150–400)
RBC: 4.78 MIL/uL (ref 3.87–5.11)
RDW: 13.1 % (ref 11.5–15.5)
WBC: 8.3 10*3/uL (ref 4.0–10.5)

## 2014-04-29 LAB — LACTATE DEHYDROGENASE: LDH: 226 U/L (ref 94–250)

## 2014-04-29 LAB — D-DIMER, QUANTITATIVE: D-Dimer, Quant: <0.27 ug/mL-FEU (ref 0.00–0.48)

## 2014-04-29 MED ORDER — APIXABAN 5 MG PO TABS: 5.0000 mg | ORAL_TABLET | Freq: Two times a day (BID) | ORAL | Status: DC

## 2014-04-29 NOTE — Progress Notes (Signed)
[facility] Earl LitesGregory A. Zigmund DanielFormanek, M.D.  NEW PATIENT EVALUATION   Name: Whitney Peters Date: 04/29/2014 MRN: [id number] DOB: [birth year]  PCP: Rudi HeapMOORE, DONALD, MD   REFERRING PHYSICIAN: Ernestina PennaMoore, Donald W, MD  REASON FOR REFERRAL: Deep venous thrombosis left lower extremity diagnosed 3 weeks ago with pulmonary embolism diagnosed in September of 2014.     HISTORY OF PRESENT ILLNESS:Whitney Peters is a [age] y.o. female who is referred by her family physician after recent diagnosis of left lower extremity deep venous thrombosis diagnosed on 04/10/2014 and currently taking warfarin 5 mg every day except Wednesday. She is attending school in the evenings and on the way home from school develop pain and swelling in the lower portion of the left thigh with throbbing. She denies any chest pain, PND, orthopnea, or palpitations. She has experienced a pulmonary embolism in the past in September of 2014 and was treated with Xarelto but did not tolerate the drug well with nausea, dizziness, and lightheadedness. She was switched to warfarin. She remained on treatment for 6 months but developed recurrent left lower extremity deep venous thrombosis about 3 weeks ago and for that reason has been referred here for evaluation of possible inherited or acquired thrombophilia syndrome. She has been pregnant 3 times with no miscarriages. No history of clotting disorders in the family.   PAST MEDICAL HISTORY:  has a past medical history of Anticoagulated on warfarin; GERD (gastroesophageal reflux disease); Asthma; and DVT (deep venous thrombosis).     PAST SURGICAL HISTORY: Past Surgical History  Procedure Laterality Date  . Foot surgery Bilateral     BONE SPURS  . Knee surgery Right   . Appendectomy    . Abdominal hysterectomy       CURRENT MEDICATIONS: has a current medication list which includes the following prescription(s): albuterol, hydrocodone-acetaminophen,  ranitidine, warfarin, enoxaparin, and pitavastatin calcium.   ALLERGIES: Pradaxa and Xarelto   SOCIAL HISTORY:  reports that she quit smoking about 22 years ago. She does not have any smokeless tobacco history on file. She reports that she does not drink alcohol or use illicit drugs.   FAMILY HISTORY: family history includes Cancer in her sister; Heart disease in her father and mother.    REVIEW OF SYSTEMS:  Other than that discussed above is noncontributory.    PHYSICAL EXAM:  weight is 183 lb 14.4 oz (83.416 kg). Her oral temperature is 97.6 F (36.4 C). Her blood pressure is 127/87 and her pulse is 87. Her respiration is 20 and oxygen saturation is 97%.    GENERAL:alert, no distress and comfortable SKIN: skin color, texture, turgor are normal, no rashes or significant lesions EYES: normal, Conjunctiva are pink and non-injected, sclera clear OROPHARYNX:no exudate, no erythema and lips, buccal mucosa, and tongue normal  NECK: supple, thyroid normal size, non-tender, without nodularity CHEST: Normal AP diameter with no breast masses. LYMPH:  no palpable lymphadenopathy in the cervical, axillary or inguinal LUNGS: clear to auscultation and percussion with normal breathing effort HEART: regular rate & rhythm and no murmurs. P2 is tolerable at the apex. ABDOMEN:abdomen soft, non-tender and normal bowel sounds MUSCULOSKELETALl:no cyanosis of digits, no clubbing or edema. Left lower 70 with slight tenderness over the left And lower left thigh with negative Homans sign.  NEURO: alert & oriented x 3 with fluent speech, no focal motor/sensory deficits    LABORATORY DATA:  Anti-coag visit on 04/28/2014  Component Date Value Ref Range Status  .  INR 04/28/2014 3.5   Final   QC WNL  Clinical Support on 04/21/2014  Component Date Value Ref Range Status  . INR 04/21/2014 3.0   Final   QC WNL  Office Visit on 04/15/2014  Component Date Value Ref Range Status  . INR 04/15/2014 4.9    Final   qc ok  Clinical Support on 04/13/2014  Component Date Value Ref Range Status  . INR 04/13/2014 1.6   Final   qc ok    Urinalysis    Component Value Date/Time   BILIRUBINUR negative 01/22/2014 0931   PROTEINUR 4+ 01/22/2014 0931   UROBILINOGEN negative 01/22/2014 0931   NITRITE negative 01/22/2014 0931   LEUKOCYTESUR large (3+) 01/22/2014 0931      @RADIOGRAPHY : [place] Venous Img Lower Unilateral Left  04/10/2014   CLINICAL DATA:  Left leg swelling.  EXAM: LEFT LOWER EXTREMITY VENOUS DOPPLER ULTRASOUND  TECHNIQUE: Gray-scale sonography with graded compression, as well as color Doppler and duplex ultrasound were performed to evaluate the lower extremity deep venous systems from the level of the common femoral vein and including the common femoral, femoral, profunda femoral, popliteal and calf veins including the posterior tibial, peroneal and gastrocnemius veins when visible. The superficial great saphenous vein was also interrogated. Spectral Doppler was utilized to evaluate flow at rest and with distal augmentation maneuvers in the common femoral, femoral and popliteal veins.  COMPARISON:  None.  FINDINGS: Common Femoral Vein: No evidence of thrombus. Normal compressibility and flow on color Doppler imaging.  Saphenofemoral Junction: No evidence of thrombus. Normal compressibility and flow on color Doppler imaging.  Profunda Femoral Vein: No evidence of thrombus. Normal compressibility and flow on color Doppler imaging.  Femoral Vein: Focal occlusive thrombus within the distal femoral vein at the adductor canal.  Popliteal Vein: No evidence of thrombus. Normal compressibility, respiratory phasicity and response to augmentation.  Calf Veins: No evidence of thrombus. Normal compressibility and flow on color Doppler imaging.  Superficial Great Saphenous Vein: No evidence of thrombus. Normal compressibility and flow on color Doppler imaging.  Venous Reflux:  None.  Other Findings:  None.  IMPRESSION:  Short segment focal occlusive clot within the distal femoral vein at the adductor canal.  These results will be called to the ordering clinician or representative by the Radiology Department at the imaging location.   Electronically Signed   By: Charlett NoseKevin  Dover M.D.   On: 04/10/2014 14:48    PATHOLOGY: No pathology.   IMPRESSION:  #1. Deep venous thrombosis left lower extremity, improved on anticoagulation. #2. History of pulmonary embolism in September of 2014. #3. Gastroesophageal reflux disease, controlled.   PLAN:  #1. Discontinue warfarin and try Eliquis 5 mg twice a day. #2. Thrombophilia workup. #3. TED stockings bilaterally above the knees. #4. Followup in 2 weeks for reevaluation, CBC, and d-dimer.  I appreciate the opportunity sharing in her care.   Maurilio LovelyFormanek, Gregory A, MD 04/29/2014 3:07 PM   DISCLAIMER:  This note was dictated with voice recognition softwre.  Similar sounding words can inadvertently be transcribed inaccurately and may not be corrected upon review.

## 2014-04-29 NOTE — Patient Instructions (Signed)
[facility] Discharge Instructions  RECOMMENDATIONS MADE BY THE CONSULTANT AND ANY TEST RESULTS WILL BE SENT TO YOUR REFERRING PHYSICIAN.  We will see you in 2 weeks for follow up appointment and repeat lab work. Prescription for TED hose given. Prescription for Eliquis, please take it daily.  Thank you for choosing [facility] to provide your oncology and hematology care.  To afford each patient quality time with our providers, please arrive at least 15 minutes before your scheduled appointment time.  With your help, our goal is to use those 15 minutes to complete the necessary work-up to ensure our physicians have the information they need to help with your evaluation and healthcare recommendations.    Effective January 1st, 2014, we ask that you re-schedule your appointment with our physicians should you arrive 10 or more minutes late for your appointment.  We strive to give you quality time with our providers, and arriving late affects you and other patients whose appointments are after yours.    Again, thank you for choosing [facility].  Our hope is that these requests will decrease the amount of time that you wait before being seen by our physicians.       _____________________________________________________________  Should you have questions after your visit to [facility], please contact our office at [phone] between the hours of 8:30 a.m. and 4:30 p.m.  Voicemails left after 4:30 p.m. will not be returned until the following business day.  For prescription refill requests, have your pharmacy contact our office with your prescription refill request.    _______________________________________________________________  We hope that we have given you very good care.  You may receive a patient satisfaction survey in the mail, please complete it and return it as soon as possible.  We value your feedback!

## 2014-04-30 LAB — CARDIOLIPIN ANTIBODIES, IGG, IGM, IGA
Anticardiolipin IgA: 5 APL U/mL — ABNORMAL LOW (ref <22)
Anticardiolipin IgG: 10 GPL U/mL — ABNORMAL LOW (ref <23)
Anticardiolipin IgM: 3 MPL U/mL — ABNORMAL LOW (ref <11)

## 2014-04-30 LAB — BETA-2-GLYCOPROTEIN I ABS, IGG/M/A
BETA-2-GLYCOPROTEIN I IGA: 7 A Units (ref <20)
Beta-2 Glyco I IgG: 3 G Units (ref <20)
Beta-2-Glycoprotein I IgM: 6 M Units (ref <20)

## 2014-04-30 LAB — HOMOCYSTEINE: Homocysteine: 11.6 umol/L (ref 4.0–15.4)

## 2014-04-30 LAB — ANTITHROMBIN III: AntiThromb III Func: 106 % (ref 75–120)

## 2014-05-01 LAB — LUPUS ANTICOAGULANT PANEL
DRVVT: 67.8 secs — ABNORMAL HIGH (ref <42.9)
Lupus Anticoagulant: NOT DETECTED
PTT LA: 48 s — AB (ref 28.0–43.0)
PTTLA 4:1 Mix: 37.4 secs (ref 28.0–43.0)
dRVVT Incubated 1:1 Mix: 38.9 secs (ref <42.9)

## 2014-05-01 LAB — PROTEIN S, TOTAL: Protein S Ag, Total: 40 % — ABNORMAL LOW (ref 60–150)

## 2014-05-01 LAB — PROTEIN S ACTIVITY

## 2014-05-01 LAB — PROTEIN C ACTIVITY: Protein C Activity: <15 % — ABNORMAL LOW (ref 75–133)

## 2014-05-01 LAB — PROTEIN C, TOTAL: PROTEIN C, TOTAL: 63 % — AB (ref 72–160)

## 2014-05-04 LAB — PROTHROMBIN GENE MUTATION

## 2014-05-04 LAB — FACTOR 5 LEIDEN

## 2014-05-07 ENCOUNTER — Ambulatory Visit (INDEPENDENT_AMBULATORY_CARE_PROVIDER_SITE_OTHER): Payer: Federal, State, Local not specified - PPO | Admitting: Pharmacist

## 2014-05-07 DIAGNOSIS — I82409 Acute embolism and thrombosis of unspecified deep veins of unspecified lower extremity: Secondary | ICD-10-CM

## 2014-05-07 DIAGNOSIS — Z86711 Personal history of pulmonary embolism: Secondary | ICD-10-CM

## 2014-05-07 DIAGNOSIS — I82402 Acute embolism and thrombosis of unspecified deep veins of left lower extremity: Secondary | ICD-10-CM

## 2014-05-07 NOTE — Progress Notes (Signed)
HPI: Patient with history of bilateral PE about 1 year ago. She was taking warfarin for 1 year and completed therapy about 6 months ago.   In June she developed a DVT in her left leg and was restarted on warfarin.  However when she say hematologist last week she was changed from warfarin to Eliquis 5mg  bid.  She is tolerating Eliquis well but was unaware that she no longer needed INR checks.   Assessment: DVT / anticoagulation therapy  Plan: Reviewed indications, side effects of Eliquis.  Explained why Eliquis does not require monitoring Reviewed s/s of bleeding to monitor.   Henrene Pastorammy Reinhard Schack, PharmD, CPP

## 2014-05-13 ENCOUNTER — Encounter (HOSPITAL_BASED_OUTPATIENT_CLINIC_OR_DEPARTMENT_OTHER): Payer: Federal, State, Local not specified - PPO

## 2014-05-13 ENCOUNTER — Encounter (HOSPITAL_COMMUNITY): Payer: Federal, State, Local not specified - PPO | Attending: Hematology and Oncology

## 2014-05-13 ENCOUNTER — Other Ambulatory Visit (HOSPITAL_COMMUNITY)

## 2014-05-13 ENCOUNTER — Encounter (HOSPITAL_COMMUNITY): Payer: Self-pay

## 2014-05-13 VITALS — BP 129/85 | HR 67 | Temp 97.8°F | Resp 16 | Ht 69.0 in | Wt 184.6 lb

## 2014-05-13 DIAGNOSIS — I82409 Acute embolism and thrombosis of unspecified deep veins of unspecified lower extremity: Secondary | ICD-10-CM | POA: Diagnosis present

## 2014-05-13 DIAGNOSIS — Z86711 Personal history of pulmonary embolism: Secondary | ICD-10-CM | POA: Insufficient documentation

## 2014-05-13 DIAGNOSIS — I82402 Acute embolism and thrombosis of unspecified deep veins of left lower extremity: Secondary | ICD-10-CM

## 2014-05-13 DIAGNOSIS — K219 Gastro-esophageal reflux disease without esophagitis: Secondary | ICD-10-CM

## 2014-05-13 LAB — CBC WITH DIFFERENTIAL/PLATELET
Basophils Absolute: 0 10*3/uL (ref 0.0–0.1)
Basophils Relative: 0 % (ref 0–1)
EOS PCT: 4 % (ref 0–5)
Eosinophils Absolute: 0.3 10*3/uL (ref 0.0–0.7)
HCT: 41.4 % (ref 36.0–46.0)
Hemoglobin: 14 g/dL (ref 12.0–15.0)
LYMPHS ABS: 2.8 10*3/uL (ref 0.7–4.0)
LYMPHS PCT: 37 % (ref 12–46)
MCH: 30.8 pg (ref 26.0–34.0)
MCHC: 33.8 g/dL (ref 30.0–36.0)
MCV: 91 fL (ref 78.0–100.0)
Monocytes Absolute: 0.5 10*3/uL (ref 0.1–1.0)
Monocytes Relative: 7 % (ref 3–12)
NEUTROS ABS: 3.9 10*3/uL (ref 1.7–7.7)
Neutrophils Relative %: 52 % (ref 43–77)
PLATELETS: 291 10*3/uL (ref 150–400)
RBC: 4.55 MIL/uL (ref 3.87–5.11)
RDW: 13.2 % (ref 11.5–15.5)
WBC: 7.5 10*3/uL (ref 4.0–10.5)

## 2014-05-13 LAB — D-DIMER, QUANTITATIVE (NOT AT ARMC): D DIMER QUANT: 0.39 ug{FEU}/mL (ref 0.00–0.48)

## 2014-05-13 NOTE — Patient Instructions (Addendum)
[facility] Discharge Instructions  RECOMMENDATIONS MADE BY THE CONSULTANT AND ANY TEST RESULTS WILL BE SENT TO YOUR REFERRING PHYSICIAN.  EXAM FINDINGS BY THE PHYSICIAN TODAY AND SIGNS OR SYMPTOMS TO REPORT TO CLINIC OR PRIMARY PHYSICIAN: Exam and findings as discussed by Dr. Zigmund Daniel.  No changes in therapy.  Plans are to see you in 4 months with labs and office visit.  Plan do scan in December and if clot is gone we will stop the Eliquis. Report any unusual bruising or bleeding, blood in your stool or in your urine, or any other concerns.  MEDICATIONS PRESCRIBED:  Continue Eliquis  INSTRUCTIONS/FOLLOW-UP: Follow-up in 4 months.  Thank you for choosing [facility] to provide your oncology and hematology care.  To afford each patient quality time with our providers, please arrive at least 15 minutes before your scheduled appointment time.  With your help, our goal is to use those 15 minutes to complete the necessary work-up to ensure our physicians have the information they need to help with your evaluation and healthcare recommendations.    Effective January 1st, 2014, we ask that you re-schedule your appointment with our physicians should you arrive 10 or more minutes late for your appointment.  We strive to give you quality time with our providers, and arriving late affects you and other patients whose appointments are after yours.    Again, thank you for choosing [facility].  Our hope is that these requests will decrease the amount of time that you wait before being seen by our physicians.       _____________________________________________________________  Should you have questions after your visit to [facility], please contact our office at [phone] between the hours of 8:30 a.m. and 4:30 p.m.  Voicemails left after 4:30 p.m. will not be returned until the following business day.  For prescription refill requests, have  your pharmacy contact our office with your prescription refill request.    _______________________________________________________________  We hope that we have given you very good care.  You may receive a patient satisfaction survey in the mail, please complete it and return it as soon as possible.  We value your feedback!  _______________________________________________________________  Have you asked about our STAR program?  STAR stands for Survivorship Training and Rehabilitation, and this is a nationally recognized cancer care program that focuses on survivorship and rehabilitation.  Cancer and cancer treatments may cause problems, such as, pain, making you feel tired and keeping you from doing the things that you need or want to do. Cancer rehabilitation can help. Our goal is to reduce these troubling effects and help you have the best quality of life possible.  You may receive a survey from a nurse that asks questions about your current state of health.  Based on the survey results, all eligible patients will be referred to the [facility] program for an evaluation so we can better serve you!  A frequently asked questions sheet is available upon request. Deep Vein Thrombosis A deep vein thrombosis (DVT) is a blood clot that develops in the deep, larger veins of the leg, arm, or pelvis. These are more dangerous than clots that might form in veins near the surface of the body. A DVT can lead to complications if the clot breaks off and travels in the bloodstream to the lungs.  A DVT can damage the valves in your leg veins, so that instead of flowing upward, the blood pools in  the lower leg. This is called post-thrombotic syndrome, and it can result in pain, swelling, discoloration, and sores on the leg. CAUSES Usually, several things contribute to blood clots forming. Contributing factors include:  The flow of blood slows down.  The inside of the vein is damaged in some way.  You have a condition  that makes blood clot more easily. RISK FACTORS Some people are more likely than others to develop blood clots. Risk factors include:   Older age, especially over 54 years of age.  Having a family history of blood clots or if you have already had a blot clot.  Having major or lengthy surgery. This is especially true for surgery on the hip, knee, or belly (abdomen). Hip surgery is particularly high risk.  Breaking a hip or leg.  Sitting or lying still for a long time. This includes long-distance travel, paralysis, or recovery from an illness or surgery.  Having cancer or cancer treatment.  Having a long, thin tube (catheter) placed inside a vein during a medical procedure.  Being overweight (obese).  Pregnancy and childbirth.  Hormone changes make the blood clot more easily during pregnancy.  The fetus puts pressure on the veins of the pelvis.  There is a risk of injury to veins during delivery or a caesarean. The risk is highest just after childbirth.  Medicines with the female hormone estrogen. This includes birth control pills and hormone replacement therapy.  Smoking.  Other circulation or heart problems.  SIGNS AND SYMPTOMS When a clot forms, it can either partially or totally block the blood flow in that vein. Symptoms of a DVT can include:  Swelling of the leg or arm, especially if one side is much worse.  Warmth and redness of the leg or arm, especially if one side is much worse.  Pain in an arm or leg. If the clot is in the leg, symptoms may be more noticeable or worse when standing or walking. The symptoms of a DVT that has traveled to the lungs (pulmonary embolism, PE) usually start suddenly and include:  Shortness of breath.  Coughing.  Coughing up blood or blood-tinged phlegm.  Chest pain. The chest pain is often worse with deep breaths.  Rapid heartbeat. Anyone with these symptoms should get emergency medical treatment right away. Call your local  emergency services (911 in the [place] if you have these symptoms. DIAGNOSIS If a DVT is suspected, your health care provider will take a full medical history and perform a physical exam. Tests that also may be required include:  Blood tests, including studies of the clotting properties of the blood.  Ultrasonography to see if you have clots in your legs or lungs.  X-rays to show the flow of blood when dye is injected into the veins (venography).  Studies of your lungs if you have any chest symptoms. PREVENTION  Exercise the legs regularly. Take a brisk 30-minute walk every day.  Maintain a weight that is appropriate for your height.  Avoid sitting or lying in bed for long periods of time without moving your legs.  Women, particularly those over the age of 35 years, should consider the risks and benefits of taking estrogen medicines, including birth control pills.  Do not smoke, especially if you take estrogen medicines.  Long-distance travel can increase your risk of DVT. You should exercise your legs by walking or pumping the muscles every hour.  In-hospital prevention:  Many of the risk factors above relate to situations that exist with  hospitalization, either for illness, injury, or elective surgery.  Your health care provider will assess you for the need for venous thromboembolism prophylaxis when you are admitted to the hospital. If you are having surgery, your surgeon will assess you the day of or day after surgery.  Prevention may include medical and nonmedical measures. TREATMENT Once identified, a DVT can be treated. It can also be prevented in some circumstances. Once you have had a DVT, you may be at increased risk for a DVT in the future. The most common treatment for DVT is blood thinning (anticoagulant) medicine, which reduces the blood's tendency to clot. Anticoagulants can stop new blood clots from forming and stop old ones from growing. They cannot dissolve existing  clots. Your body does this by itself over time. Anticoagulants can be given by mouth, by IV access, or by injection. Your health care provider will determine the best program for you. Other medicines or treatments that may be used are:  Heparin or related medicines (low molecular weight heparin) are usually the first treatment for a blood clot. They act quickly. However, they cannot be taken orally.  Heparin can cause a fall in a component of blood that stops bleeding and forms blood clots (platelets). You will be monitored with blood tests to be sure this does not occur.  Warfarin is an anticoagulant that can be swallowed. It takes a few days to start working, so usually heparin or related medicines are used in combination. Once warfarin is working, heparin is usually stopped.  Less commonly, clot dissolving drugs (thrombolytics) are used to dissolve a DVT. They carry a high risk of bleeding, so they are used mainly in severe cases, where your life or a limb is threatened.  Very rarely, a blood clot in the leg needs to be removed surgically.  If you are unable to take anticoagulants, your health care provider may arrange for you to have a filter placed in a main vein in your abdomen. This filter prevents clots from traveling to your lungs. HOME CARE INSTRUCTIONS  Take all medicines prescribed by your health care provider. Only take over-the-counter or prescription medicines for pain, fever, or discomfort as directed by your health care provider.  Warfarin. Most people will continue taking warfarin after hospital discharge. Your health care provider will advise you on the length of treatment (usually 3-6 months, sometimes lifelong).  Too much and too little warfarin are both dangerous. Too much warfarin increases the risk of bleeding. Too little warfarin continues to allow the risk for blood clots. While taking warfarin, you will need to have regular blood tests to measure your blood clotting  time. These blood tests usually include both the prothrombin time (PT) and international normalized ratio (INR) tests. The PT and INR results allow your health care provider to adjust your dose of warfarin. The dose can change for many reasons. It is critically important that you take warfarin exactly as prescribed, and that you have your PT and INR levels drawn exactly as directed.  Many foods, especially foods high in vitamin K, can interfere with warfarin and affect the PT and INR results. Foods high in vitamin K include spinach, kale, broccoli, cabbage, collard and turnip greens, brussel sprouts, peas, cauliflower, seaweed, and parsley as well as beef and pork liver, green tea, and soybean oil. You should eat a consistent amount of foods high in vitamin K. Avoid major changes in your diet, or notify your health care provider before changing your diet. Arrange  a visit with a dietitian to answer your questions.  Many medicines can interfere with warfarin and affect the PT and INR results. You must tell your health care provider about any and all medicines you take. This includes all vitamins and supplements. Be especially cautious with aspirin and anti-inflammatory medicines. Ask your health care provider before taking these. Do not take or discontinue any prescribed or over-the-counter medicine except on the advice of your health care provider or pharmacist.  Warfarin can have side effects, primarily excessive bruising or bleeding. You will need to hold pressure over cuts for longer than usual. Your health care provider or pharmacist will discuss other potential side effects.  Alcohol can change the body's ability to handle warfarin. It is best to avoid alcoholic drinks or consume only very small amounts while taking warfarin. Notify your health care provider if you change your alcohol intake.  Notify your dentist or other health care providers before procedures.  Activity. Ask your health care  provider how soon you can go back to normal activities. It is important to stay active to prevent blood clots. If you are on anticoagulant medicine, avoid contact sports.  Exercise. It is very important to exercise. This is especially important while traveling, sitting, or standing for long periods of time. Exercise your legs by walking or by pumping the muscles frequently. Take frequent walks.  Compression stockings. These are tight elastic stockings that apply pressure to the lower legs. This pressure can help keep the blood in the legs from clotting. You may need to wear compression stockings at home to help prevent a DVT.  Do not smoke. If you smoke, quit. Ask your health care provider for help with quitting smoking.  Learn as much as you can about DVT. Knowing more about the condition should help you keep it from coming back.  Wear a medical alert bracelet or carry a medical alert card. SEEK MEDICAL CARE IF:  You notice a rapid heartbeat.  You feel weaker or more tired than usual.  You feel faint.  You notice increased bruising.  You feel your symptoms are not getting better in the time expected.  You believe you are having side effects of medicine. SEEK IMMEDIATE MEDICAL CARE IF:  You have chest pain.  You have trouble breathing.  You have new or increased swelling or pain in one leg.  You cough up blood.  You notice blood in vomit, in a bowel movement, or in urine. MAKE SURE YOU:  Understand these instructions.  Will watch your condition.  Will get help right away if you are not doing well or get worse. Document Released: 10/23/2005 Document Revised: 08/13/2013 Document Reviewed: 06/30/2013 [facility] Patient Information 2015 [place], [place]. This information is not intended to replace advice given to you by your health care provider. Make sure you discuss any questions you have with your health care provider. Venous Thromboembolism, Prevention A venous  thromboembolism is a blood clot that forms in a vein. A blood clot in a deep vein is called a deep venous thrombosis (DVT). A blood clot in the lungs is called a pulmonary embolism (PE). Blood clots are dangerous and can cause death. Blood clots can form in the:  Lungs.  Legs.  Arms. CAUSES  A blood clot can form in a vein from different conditions. A blood clot can develop due to:  Blood flow within a vein that is sluggish or very slow.  Medical conditions that make the blood clot easily.  Vein  damage. RISK FACTORS Risk factors can increase your risk of developing a blood clot. Risk factors can include:  Smoking.  Obesity.  Age.  Immobility or sedentary lifestyle.  Sitting or standing for long periods of time.  Chronic or long-term bedrest.  Medical or past history of blood clots.  Family history of blood clots.  Hip, leg, or pelvis injury or trauma.  Major surgery, especially surgery on the hip, knee, or abdomen.  Pregnancy and childbirth.  Birth control pills and hormone replacement therapy.  Medical conditions such as  Peripheral vascular disease (PVD).  Diabetes.  Cancer. SYMPTOMS  Symptoms of VTE can depend on where the clot is located and if the clot breaks off and travels to another organ. Sometimes, there may be no symptoms.   DVT symptoms can include:  Swelling of the leg or arm, especially on one side.  Warmth and redness of the leg or arm, especially on one side.  Pain in an arm or leg. Leg pain may be more noticeable or worse when standing or walking.  PE symptoms can include:  Shortness of breath.  Coughing.  Coughing up blood or blood-tinged mucus (hemoptysis).  Chest pain or chest pain with deep breaths (pleuritic chest pain).  Apprehension, anxiety, or a feeling of impending doom.  Rapid heartbeat. PREVENTION  Exercise regularly. Take a brisk 30 minute walk every day. Staying active and moving around can help prevent blood  clots.  Avoid sitting or lying in bed for long periods of time. Change your position often, especially during a long trip.  Women, especially those over the age of 3, should consider the risks and benefits of taking estrogen medicines. This includes birth control pills and hormone replacement therapy.  Do not smoke, especially if you take estrogen medicines. If you smoke, talk to your caregiver on how to quit.  Eat plenty of fruits and vegetables. Ask your caregiver or dietitian if there are foods you should avoid.  Maintain a weight as suggested by your caregiver.  Wear loose-fitting clothing. Avoid constrictive or tight clothing around your legs or waist.  Try not to bump or injure your legs. Avoid crossing your legs when you are sitting.  Do not use pillows under your knees unless told by your caregiver.  Take all medicines that your caregiver prescribes you.  Wear special stockings (compression stockings or TED hose) if your caregiver prescribes them.  Wearing compression stockings (support hose) can make the leg veins more narrow. This increases blood flow in the legs and can help prevent blood clots.  It is important to wear compression stockings correctly. Do not let them bunch up when you are wearing them. TRAVEL Long distance travel can increase the risk of a blood clot. To prevent a blood clot when traveling:  You should exercise your legs by walking or by pumping your muscles every hour. To help prevent poor circulation on long trips, stand, stretch, and walk up and down the aisle of your airplane, train, or bus as often as possible to get the blood moving.  Do squats if you are able. If you are unable to do squats, raise your foot on the balls of your feet and tighten your lower leg muscles (particularly the calve muscles) while seated. Pointing (flexing and extending) your toes while tightening your calves while seated are also good exercises to do every hour during long  trips. They help increase blood flow and reduce risk of DVT.  Stay well hydrated. Drink water regularly  when traveling, especially when you are sitting or immobile for long periods of time.  Use of drugs to prevent DVT during routine travel is not generally recommended. Before taking any drugs to reduce risk of DVT, consult your caregiver. SURGERY AND HOSPITALIZATION  People who are at high risk for a blood clot may be given a blood thinning medicine (anticoagulant) when they are hospitalized even if they are not going to have surgery.  A long trip prior to surgery can increase the risk of a clot for patients undergoing hip and knee replacements. Talk to your caregiver about travel plans before your surgery.  After hip or knee surgery, your caregiver may give you anticoagulants to help prevent blood clots.  Anticoagulants may be given to people at high risk of developing thromboembolism, before, during, or sometimes after surgery, including people with clotting disorders or with a history of past thromboembolism. TRAVEL AFTER SURGERY  In orthopedic surgery, the cutting of bones prompts the body to increase clotting factors in the blood. Due to the size of the bones involved in hip and knee replacements, there is a higher risk of blood clotting than other orthopedic surgeries.  There is a risk of clotting for up to 4-6 weeks after surgery. Flying or traveling long distances can increase your risk of a clot. As a result, those who travel long distances may need additional preventive measures after their procedure.  Drink only non-alcoholic beverages during your flight, train, or car travel. Alcohol can dehydrate you and increase your risk of getting blood clots. SEEK IMMEDIATE MEDICAL CARE IF:   You develop chest pain.  You develop severe shortness of breath.  You have breathing problems after traveling.  You develop swelling or pain in the leg.  You begin to cough up bloody mucus or  phlegm (sputum).  You feel dizzy or faint. Document Released: 10/11/2009 Document Revised: 07/17/2012 Document Reviewed: 10/11/2009 [facility] Patient Information 2015 [place], [place]. This information is not intended to replace advice given to you by your health care provider. Make sure you discuss any questions you have with your health care provider.

## 2014-05-13 NOTE — Progress Notes (Signed)
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 Deep venous thrombosis    CURRENT THERAPY: Eliquis 5 mg twice a day.  INTERVAL HISTORY: Whitney Peters 53 y.o. female returns for followup of left lower shin the deep venous thrombosis associated with pulmonary embolism with the former diagnosed in early June 2015 and the latter diagnosed in September of 2014. Her therapy was changed from warfarin to eliquis on 04/29/2014 and she is here today to discuss tolerability and efficacy after undergoing thrombophilic workup.  She denies any left lower 70 swelling or pain. Appetite is good with no epistaxis, melena, hematochezia, hematuria, vaginal bleeding, or hemoptysis. She denies easy bruising. Appetite is good but she has not been sleeping well because of anxiety. She denies a diarrhea, constipation, nausea, vomiting, or headache.  MEDICAL HISTORY: Past Medical History  Diagnosis Date  . Anticoagulated on warfarin   . GERD (gastroesophageal reflux disease)   . Asthma   . 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    ALLERGIES:  is allergic to pradaxa and xarelto.  MEDICATIONS: has a current medication list which includes the following prescription(s): albuterol, apixaban, hydrocodone-acetaminophen, ranitidine, and pitavastatin calcium.  SURGICAL HISTORY:  Past Surgical History  Procedure Laterality Date  . Foot surgery Bilateral     BONE SPURS  . Knee surgery Right   .  Appendectomy    . Abdominal hysterectomy      FAMILY HISTORY: family history includes Cancer in her sister; Heart disease in her father and mother.  SOCIAL HISTORY:  reports that she quit smoking about 22 years ago. She has never used smokeless tobacco. She reports that she does not drink alcohol or use illicit drugs.  REVIEW OF SYSTEMS:  Other than that discussed above is noncontributory.  PHYSICAL EXAMINATION: ECOG PERFORMANCE STATUS: 1 - Symptomatic but completely ambulatory  Blood pressure 129/85, pulse 67, temperature 97.8 F (36.6 C), temperature source Oral, resp. rate 16, height '5\' 9"'  (1.753 m), weight 184 lb 9.6 oz (83.734 kg).  GENERAL:alert, no distress and comfortable SKIN: skin color, texture, turgor are normal, no rashes or significant lesions EYES: PERLA; Conjunctiva are pink and non-injected, sclera clear SINUSES: No redness or tenderness over maxillary or ethmoid sinuses OROPHARYNX:no exudate, no erythema on lips, buccal mucosa, or tongue. NECK: supple, thyroid normal size, non-tender, without nodularity. No masses CHEST: Normal AP diameter with no breast masses. LYMPH:  no palpable lymphadenopathy in the cervical, axillary or inguinal LUNGS: clear to auscultation and percussion with normal breathing effort HEART: regular rate & rhythm and no murmurs. ABDOMEN:abdomen soft, non-tender and normal bowel sounds MUSCULOSKELETAL:no cyanosis of digits and no clubbing. Range of motion normal. No evidence of left lower 70 swelling or redness. Homans sign negative. NEURO: alert & oriented x 3 with fluent speech, no focal motor/sensory deficits   LABORATORY DATA: Lab on 05/13/2014  Component Date Value Ref Range Status  . WBC 05/13/2014 7.5  4.0 - 10.5 K/uL Final  . RBC 05/13/2014 4.55  3.87 - 5.11 MIL/uL Final  . Hemoglobin 05/13/2014 14.0  12.0 - 15.0 g/dL Final  . HCT 05/13/2014 41.4  36.0 - 46.0 % Final  . MCV 05/13/2014 91.0  78.0 - 100.0 fL Final  . MCH 05/13/2014  30.8  26.0 - 34.0 pg Final  . MCHC 05/13/2014 33.8  30.0 - 36.0 g/dL Final  . RDW 05/13/2014 13.2  11.5 - 15.5 % Final  . Platelets 05/13/2014 291  150 - 400 K/uL Final  . Neutrophils Relative % 05/13/2014 52  43 - 77 % Final  . Neutro Abs 05/13/2014 3.9  1.7 - 7.7 K/uL Final  . Lymphocytes Relative 05/13/2014 37  12 - 46 % Final  . Lymphs Abs 05/13/2014 2.8  0.7 - 4.0 K/uL Final  . Monocytes Relative 05/13/2014 7  3 - 12 % Final  . Monocytes Absolute 05/13/2014 0.5  0.1 - 1.0 K/uL Final  . Eosinophils Relative 05/13/2014 4  0 - 5 % Final  . Eosinophils Absolute 05/13/2014 0.3  0.0 - 0.7 K/uL Final  . Basophils Relative 05/13/2014 0  0 - 1 % Final  . Basophils Absolute 05/13/2014 0.0  0.0 - 0.1 K/uL Final  Office Visit on 04/29/2014  Component Date Value Ref Range Status  . WBC 04/29/2014 8.3  4.0 - 10.5 K/uL Final  . RBC 04/29/2014 4.78  3.87 - 5.11 MIL/uL Final  . Hemoglobin 04/29/2014 14.9  12.0 - 15.0 g/dL Final  . HCT 04/29/2014 43.3  36.0 - 46.0 % Final  . MCV 04/29/2014 90.6  78.0 - 100.0 fL Final  . MCH 04/29/2014 31.2  26.0 - 34.0 pg Final  . MCHC 04/29/2014 34.4  30.0 - 36.0 g/dL Final  . RDW 04/29/2014 13.1  11.5 - 15.5 % Final  . Platelets 04/29/2014 344  150 - 400 K/uL Final  . Neutrophils Relative % 04/29/2014 55  43 - 77 % Final  . Neutro Abs 04/29/2014 4.7  1.7 - 7.7 K/uL Final  . Lymphocytes Relative 04/29/2014 34  12 - 46 % Final  . Lymphs Abs 04/29/2014 2.8  0.7 - 4.0 K/uL Final  . Monocytes Relative 04/29/2014 6  3 - 12 % Final  . Monocytes Absolute 04/29/2014 0.5  0.1 - 1.0 K/uL Final  . Eosinophils Relative 04/29/2014 4  0 - 5 % Final  . Eosinophils Absolute 04/29/2014 0.3  0.0 - 0.7 K/uL Final  . Basophils Relative 04/29/2014 1  0 - 1 % Final  . Basophils Absolute 04/29/2014 0.1  0.0 - 0.1 K/uL Final  . Sodium 04/29/2014 139  137 - 147 mEq/L Final  . Potassium 04/29/2014 3.9  3.7 - 5.3 mEq/L Final  . Chloride 04/29/2014 103  96 - 112 mEq/L Final  . CO2  04/29/2014 23  19 - 32 mEq/L Final  . Glucose, Bld 04/29/2014 139* 70 - 99 mg/dL Final  . BUN 04/29/2014 7  6 - 23 mg/dL Final  . Creatinine, Ser 04/29/2014 0.78  0.50 - 1.10 mg/dL Final  . Calcium 04/29/2014 9.4  8.4 - 10.5 mg/dL Final  . Total Protein 04/29/2014 7.4  6.0 - 8.3 g/dL Final  . Albumin 04/29/2014 3.6  3.5 - 5.2 g/dL Final  . AST 04/29/2014 21  0 - 37 U/L Final  . ALT 04/29/2014 25  0 - 35 U/L Final  . Alkaline Phosphatase 04/29/2014 110  39 - 117 U/L Final  . Total Bilirubin 04/29/2014 0.2* 0.3 - 1.2 mg/dL Final  . GFR calc non Af  Amer 04/29/2014 >90  >90 mL/min Final  . GFR calc Af Amer 04/29/2014 >90  >90 mL/min Final   Comment: (NOTE)                          The eGFR has been calculated using the CKD EPI equation.                          This calculation has not been validated in all clinical situations.                          eGFR's persistently <90 mL/min signify possible Chronic Kidney                          Disease.  Marland Kitchen LDH 04/29/2014 226  94 - 250 U/L Final  . D-Dimer, Quant 04/29/2014 <0.27  0.00 - 0.48 ug/mL-FEU Final   Comment:                                 AT THE INHOUSE ESTABLISHED CUTOFF                          VALUE OF 0.48 ug/mL FEU,                          THIS ASSAY HAS BEEN DOCUMENTED                          IN THE LITERATURE TO HAVE                          A SENSITIVITY AND NEGATIVE                          PREDICTIVE VALUE OF AT LEAST                          98 TO 99%.  THE TEST RESULT                          SHOULD BE CORRELATED WITH                          AN ASSESSMENT OF THE CLINICAL                          PROBABILITY OF DVT / VTE.  Marland Kitchen AntiThromb III Func 04/29/2014 106  75 - 120 % Final   Performed at [facility]  . Protein C Activity 04/29/2014 <15* 75 - 133 % Final   Performed at [facility]  . Protein C, Total 04/29/2014 63* 72 - 160 % Final   Comment: ** Please note change in reference range(s). **                           Performed at [facility]  . Protein S Activity 04/29/2014 <10* 69 - 129 % Final   Performed at [facility]  . Protein  S Ag, Total 04/29/2014 40* 60 - 150 % Final   Performed at [facility]  . PTT Lupus Anticoagulant 04/29/2014 48.0* 28.0 - 43.0 secs Final  . PTTLA Confirmation 04/29/2014 NOT APPL  <8.0 secs Final  . PTTLA 4:1 Mix 04/29/2014 37.4  28.0 - 43.0 secs Final  . Drvvt 04/29/2014 67.8* <42.9 secs Final  . Drvvt confirmation 04/29/2014 NOT APPL  <1.15 Ratio Final  . dRVVT Incubated 1:1 Mix 04/29/2014 38.9  <42.9 secs Final  . Lupus Anticoagulant 04/29/2014 NOT DETECTED  NOT DETECTED Final   Performed at [facility]  . Beta-2 Glyco I IgG 04/29/2014 3  <20 G Units Final  . Beta-2-Glycoprotein I IgM 04/29/2014 6  <20 M Units Final  . Beta-2-Glycoprotein I IgA 04/29/2014 7  <20 A Units Final   Performed at [facility]  . Homocysteine 04/29/2014 11.6  4.0 - 15.4 umol/L Final   Performed at [facility]  . Interpretation-F5LEID: 04/29/2014 REPORT   Final   Comment: (NOTE)                          This individual is negative (normal) for the Factor V                          Leiden (R506Q) mutation in the Factor V gene.                          Increased risk of thrombophilia can be caused by a                          variety of genetic and non-genetic factors not                          screened for by this assay.  . Recommendations-F5LEID: 04/29/2014 REPORT   Final   FACTOR V LEIDEN (R506Q) MUTATION NOT DETECTED  . Reviewer 04/29/2014 REPORT   Final   Comment: (NOTE)                          Forestine Na, Ph.D.,FAC[facility],                          Director, Molecular Genetics                          SUPPLEMENTAL INFORMATION                          The Factor V Leiden (Z610R) mutation [[id number]:c.                          1601G>A (p.R534Q)] in the Factor V gene is one of the                           most common causes of inherited thrombophilia.  This                          mutation causes resistance to degradation of activated  Factor V protein by activated Protein C (APC).                          The Factor V Leiden (R506Q) mutation is detected by                          amplification of the selected region of Factor V gene                          by polymerase chain reaction (PCR) and fluorescent                          probe hybridization to the targeted region, followed by                          melting curve analysis with a real time PCR system.                          Although rare, false positive or false negative results                          may occur.  All results should be interpreted in                          context of clinical findings, relevant history, and                          other laboratory data.                          Health care providers, please contact your local                          [place] genetic counselor or call                          [phone] [phone]) for assistance with                          interpretation of these results.                          This test was developed and its performance                          characteristics have been determined by [facility], , [place].                          It has not been cleared or approved by the U.S.                          Food and Drug Administration. The FDA has determined  that such clearance or approval is not necessary.                          Performance characteristics refer to the analytical                          performance of the test.                          Performed at [facility]  . Interpretation-PTGENE: 04/29/2014 REPORT   Final   Comment: (NOTE)                          This individual is negative (normal) for the G20210A                           mutation in the Prothrombin/Factor II gene.  Increased                          risk of thrombophilia can be caused by a variety of                          genetic and non-genetic factors not screened for by                          this assay.  . Recommendations-PTGENE: 04/29/2014 REPORT   Final   THE G20210A MUTATION NOT DETECTED  . Reviewer 04/29/2014 REPORT   Final   Comment: (NOTE)                          Forestine Na, Ph.D.,F[facility],                          Director, Molecular Genetics                          SUPPLEMENTAL INFORMATION                          The G20210A mutation [[id number]:g.21538G>A (c.*97G>A)]                          in the Prothrombin/Factor II gene is the second most                          common inherited risk factor for thrombosis occurring                          in approximately 2% of Caucasians.  Presence of the                          mutation is associated with an elevation of prothrombin                          levels to about 30% above normal in heterozygotes and  to 70% above normal in homozygotes.                          The G20210A mutation is detected by amplification of                          the selected region of Factor II gene by polymerase                          chain reaction (PCR) and fluorescent probe                          hybridization to the targeted region, followed by                          melting curve analysis with a real time PCR system.                          Although rare, false positive or false negative results                          may occur.  All results should be interpreted in                          context of clinical findings, relevant history, and                          other laboratory data.                          Health care providers, please contact your local                          [place] genetic counselor or call                           [phone]) for assistance with                          interpretation of these results.                          This test was developed and its performance                          characteristics have been determined by [facility], [place], [place].                          It has not been cleared or approved by the U.S.                          Food and Drug Administration. The FDA has determined  that such clearance or approval is not necessary.                          Performance characteristics refer to the analytical                          performance of the test.                          Performed at [facility]  . Anticardiolipin IgG 04/29/2014 10* <23 GPL U/mL Final  . Anticardiolipin IgM 04/29/2014 3* <11 MPL U/mL Final  . Anticardiolipin IgA 04/29/2014 5* <22 APL U/mL Final   Comment: (NOTE)                          Reference Range:  Cardiolipin IgG                            Normal                  <23                            Low Positive (+)        23-35                            Moderate Positive (+)   36-50                            High Positive (+)       >50                          Reference Range:  Cardiolipin IgM                            Normal                  <11                            Low Positive (+)        11-20                            Moderate Positive (+)   21-30                            High Positive (+)       >30                          Reference Range:  Cardiolipin IgA                            Normal                  <22  Low Positive (+)        22-35                            Moderate Positive (+)   36-45                            High Positive (+)       >45                          Performed at [facility]  Anti-coag visit on 04/28/2014  Component Date Value Ref Range Status    . INR 04/28/2014 3.5   Final   QC WNL  Clinical Support on 04/21/2014  Component Date Value Ref Range Status  . INR 04/21/2014 3.0   Final   QC WNL  Office Visit on 04/15/2014  Component Date Value Ref Range Status  . INR 04/15/2014 4.9   Final   qc ok    PATHOLOGY: None.  Urinalysis    Component Value Date/Time   BILIRUBINUR negative 01/22/2014 0931   PROTEINUR 4+ 01/22/2014 0931   UROBILINOGEN negative 01/22/2014 0931   NITRITE negative 01/22/2014 0931   LEUKOCYTESUR large (3+) 01/22/2014 0931    RADIOGRAPHIC STUDIES: No results found.  ASSESSMENT:  #1. Deep venous thrombosis left lower extremity diagnosed by ultrasound on 04/10/2014, improved on anticoagulation, decreased protein S and protein C due to previous warfarin therapy which was stopped on 04/29/2014 when the lab tests were done. No other lab tests to suggest congenital or acquired thrombophilia. #2. History of pulmonary embolism in September of 2014.  #3. Gastroesophageal reflux disease, controlled    PLAN:  #1. Continue Eliquis 5 mg twice a day. #2. Followup in 4 months with CBC and d-dimer. At that time arrangements will be made for repeat venous ultrasound of the left lower extremity and if negative, Eliquis will be discontinued and repeat protein C and protein S. determinations will be done about 3 weeks later to demonstrate normalization of protein S and protein C values while off all anticoagulants. #3. The patient is in agreement with this strategy. She was told to call should she develop any bleeding episodes or severe bruising.   All questions were answered. The patient knows to call the clinic with any problems, questions or concerns. We can certainly see the patient much sooner if necessary.   I spent 25 minutes counseling the patient face to face. The total time spent in the appointment was 30 minutes.    Doroteo Bradford, MD 05/13/2014 10:20 AM  DISCLAIMER:  This note was dictated with voice  recognition software.  Similar sounding words can inadvertently be transcribed inaccurately and may not be corrected upon review.

## 2014-05-13 NOTE — Addendum Note (Signed)
Addended by: Evelena LeydenBURGESS, Aveena Bari S on: 05/13/2014 10:32 AM   Modules accepted: Orders

## 2014-05-13 NOTE — Progress Notes (Signed)
LABS DRAWN FOR CBCD, DDIM 

## 2014-05-18 ENCOUNTER — Ambulatory Visit (HOSPITAL_COMMUNITY)

## 2014-07-20 ENCOUNTER — Ambulatory Visit (INDEPENDENT_AMBULATORY_CARE_PROVIDER_SITE_OTHER): Payer: Federal, State, Local not specified - PPO | Admitting: Family Medicine

## 2014-07-20 ENCOUNTER — Encounter: Payer: Self-pay | Admitting: Family Medicine

## 2014-07-20 VITALS — BP 141/81 | HR 88 | Temp 97.0°F | Ht 69.0 in | Wt 186.8 lb

## 2014-07-20 DIAGNOSIS — B351 Tinea unguium: Secondary | ICD-10-CM

## 2014-07-20 MED ORDER — CICLOPIROX 8 % EX SOLN: Freq: Every day | CUTANEOUS | Status: DC

## 2014-07-20 NOTE — Progress Notes (Signed)
   Subjective:    Patient ID: Whitney Peters, female    DOB: Apr 11, 1961, 53 y.o.   MRN: [id number]  HPI This 53 y.o. female presents for evaluation of bilateral toenail fungus.   Review of Systems    No chest pain, SOB, HA, dizziness, vision change, N/V, diarrhea, constipation, dysuria, urinary urgency or frequency, myalgias, arthralgias or rash.  Objective:   Physical Exam  Vital signs noted  Well developed well nourished female.  HEENT - Head atraumatic Normocephalic Respiratory - Lungs CTA bilateral Cardiac - RRR S1 and S2 without murmur Skin - onychomycosis bilateral toes     Assessment & Plan:  Onychomycosis Pen Lac solution and apply qd and remove weekly with nail remover #1 w/1 refill. Follow up prn  Deatra Canter FNP

## 2014-08-02 ENCOUNTER — Other Ambulatory Visit (HOSPITAL_COMMUNITY): Payer: Self-pay | Admitting: Hematology and Oncology

## 2014-08-02 DIAGNOSIS — I824Y2 Acute embolism and thrombosis of unspecified deep veins of left proximal lower extremity: Secondary | ICD-10-CM

## 2014-08-02 DIAGNOSIS — E860 Dehydration: Secondary | ICD-10-CM

## 2014-08-20 ENCOUNTER — Ambulatory Visit (INDEPENDENT_AMBULATORY_CARE_PROVIDER_SITE_OTHER): Payer: Federal, State, Local not specified - PPO | Admitting: Family

## 2014-08-20 ENCOUNTER — Encounter: Payer: Self-pay | Admitting: Family

## 2014-08-20 VITALS — BP 115/74 | HR 66 | Temp 97.8°F | Ht 69.0 in | Wt 183.6 lb

## 2014-08-20 DIAGNOSIS — J069 Acute upper respiratory infection, unspecified: Secondary | ICD-10-CM

## 2014-08-20 MED ORDER — BENZONATATE 200 MG PO CAPS: 200.0000 mg | ORAL_CAPSULE | Freq: Three times a day (TID) | ORAL | Status: DC | PRN

## 2014-08-20 MED ORDER — AMOXICILLIN 500 MG PO CAPS: 500.0000 mg | ORAL_CAPSULE | Freq: Two times a day (BID) | ORAL | Status: DC

## 2014-08-20 NOTE — Patient Instructions (Addendum)
Upper Respiratory Infection, Adult An upper respiratory infection (URI) is also known as the common cold. It is often caused by a type of germ (virus). Colds are easily spread (contagious). You can pass it to others by kissing, coughing, sneezing, or drinking out of the same glass. Usually, you get better in 1 or 2 weeks.  HOME CARE   Only take medicine as told by your doctor.  Use a warm mist humidifier or breathe in steam from a hot shower.  Drink enough water and fluids to keep your pee (urine) clear or pale yellow.  Get plenty of rest.  Return to work when your temperature is back to normal or as told by your doctor. You may use a face mask and wash your hands to stop your cold from spreading. GET HELP RIGHT AWAY IF:   After the first few days, you feel you are getting worse.  You have questions about your medicine.  You have chills, shortness of breath, or brown or red spit (mucus).  You have yellow or brown snot (nasal discharge) or pain in the face, especially when you bend forward.  You have a fever, puffy (swollen) neck, pain when you swallow, or white spots in the back of your throat.  You have a bad headache, ear pain, sinus pain, or chest pain.  You have a high-pitched whistling sound when you breathe in and out (wheezing).  You have a lasting cough or cough up blood.  You have sore muscles or a stiff neck. MAKE SURE YOU:   Understand these instructions.  Will watch your condition.  Will get help right away if you are not doing well or get worse. Document Released: 04/10/2008 Document Revised: 01/15/2012 Document Reviewed: 01/28/2014 ExitCare Patient Information 2015 [employer]. This information is not intended to replace advice given to you by your health care provider. Make sure you discuss any questions you have with your health care provider.  \ - Take meds as prescribed - Use a cool mist humidifier  -Use saline nose sprays frequently -Saline  irrigations of the nose can be very helpful if done frequently.  * 4X daily for 1 week*  * Use of a nettie pot can be helpful with this. Follow directions with this* -Force fluids -For any cough or congestion  Use plain Mucinex- regular strength or max strength is fine   * Children- consult with Pharmacist for dosing -For fever or aces or pains- take tylenol or ibuprofen appropriate for age and weight.  * for fevers greater than 101 orally you may alternate ibuprofen and tylenol every  3 hours. -Throat lozenges if help   Fahmida Jurich, FNP   

## 2014-08-20 NOTE — Progress Notes (Signed)
Subjective:    Patient ID: Whitney Peters, female    DOB: 21-Aug-1961, 53 y.o.   MRN: [id number]  Cough This is a new problem. The current episode started in the past 7 days ("Four to five days  ago"). The problem has been gradually worsening. The cough is non-productive. Associated symptoms include ear pain, a fever, nasal congestion and postnasal drip. Pertinent negatives include no chills, ear congestion, headaches, myalgias, rhinorrhea, shortness of breath or wheezing. The symptoms are aggravated by lying down. She has tried prescription cough suppressant for the symptoms. The treatment provided mild relief. Her past medical history is significant for asthma. There is no history of COPD.      Review of Systems  Constitutional: Positive for fever. Negative for chills.  HENT: Positive for ear pain and postnasal drip. Negative for rhinorrhea.   Eyes: Negative.   Respiratory: Positive for cough. Negative for shortness of breath and wheezing.   Cardiovascular: Negative.  Negative for palpitations.  Gastrointestinal: Negative.   Endocrine: Negative.   Genitourinary: Negative.   Musculoskeletal: Negative.  Negative for myalgias.  Neurological: Negative.  Negative for headaches.  Hematological: Negative.   Psychiatric/Behavioral: Negative.   All other systems reviewed and are negative.      Objective:   Physical Exam  Vitals reviewed. Constitutional: She is oriented to person, place, and time. She appears well-developed and well-nourished. No distress.  HENT:  Head: Normocephalic and atraumatic.  Right Ear: External ear normal.  Left Ear: External ear normal.  Nasal passage erythemas Oropharynx erythemas  Eyes: Pupils are equal, round, and reactive to light.  Neck: Normal range of motion. Neck supple. No thyromegaly present.  Cardiovascular: Normal rate, regular rhythm, normal heart sounds and intact distal pulses.   No murmur heard. Pulmonary/Chest: Effort normal and breath  sounds normal. No respiratory distress. She has no wheezes.  Abdominal: Soft. Bowel sounds are normal. She exhibits no distension. There is no tenderness.  Musculoskeletal: Normal range of motion. She exhibits no edema and no tenderness.  Neurological: She is alert and oriented to person, place, and time. She has normal reflexes. No cranial nerve deficit.  Skin: Skin is warm and dry.  Psychiatric: She has a normal mood and affect. Her behavior is normal. Judgment and thought content normal.    BP 115/74  Pulse 66  Temp(Src) 97.8 F (36.6 C)  Ht 5\' 9"  (1.753 m)  Wt 183 lb 9.6 oz (83.28 kg)  BMI 27.10 kg/m2       Assessment & Plan:  1. URI (upper respiratory infection) -- Take meds as prescribed - Use a cool mist humidifier  -Use saline nose sprays frequently -Saline irrigations of the nose can be very helpful if done frequently.  * 4X daily for 1 week*  * Use of a nettie pot can be helpful with this. Follow directions with this* -Force fluids -For any cough or congestion  Use plain Mucinex- regular strength or max strength is fine   * Children- consult with Pharmacist for dosing -For fever or aces or pains- take tylenol or ibuprofen appropriate for age and weight.  * for fevers greater than 101 orally you may alternate ibuprofen and tylenol every  3 hours. -Throat lozenges if help - amoxicillin (AMOXIL) 500 MG capsule; Take 1 capsule (500 mg total) by mouth 2 (two) times daily.  Dispense: 14 capsule; Refill: 0 - benzonatate (TESSALON) 200 MG capsule; Take 1 capsule (200 mg total) by mouth 3 (three) times daily as needed.  Dispense:  30 capsule; Refill: 1  Jannifer Rodneyhristy Sha Amer, FNP

## 2014-09-08 ENCOUNTER — Telehealth (HOSPITAL_COMMUNITY): Payer: Self-pay

## 2014-09-08 ENCOUNTER — Ambulatory Visit (HOSPITAL_COMMUNITY)
Admission: RE | Admit: 2014-09-08 | Discharge: 2014-09-08 | Disposition: A | Discharge status: Home / Self Care | Payer: Federal, State, Local not specified - PPO | Source: Ambulatory Visit | Attending: Hematology and Oncology | Admitting: Hematology and Oncology

## 2014-09-08 ENCOUNTER — Other Ambulatory Visit (HOSPITAL_COMMUNITY): Payer: Self-pay | Admitting: Hematology and Oncology

## 2014-09-08 DIAGNOSIS — I82409 Acute embolism and thrombosis of unspecified deep veins of unspecified lower extremity: Secondary | ICD-10-CM

## 2014-09-08 DIAGNOSIS — I824Y2 Acute embolism and thrombosis of unspecified deep veins of left proximal lower extremity: Secondary | ICD-10-CM | POA: Insufficient documentation

## 2014-09-08 NOTE — Telephone Encounter (Signed)
Patient notified and verbalized understanding of instructions. 

## 2014-09-08 NOTE — Telephone Encounter (Signed)
-----   Message from Alla GermanGregory Formanek, MD sent at 09/08/2014 12:22 PM EST ----- Please call the retina to inform her that her ultrasound study was negative for blood clot. She may discontinue eliquis and has an appointment for 09/15/2014. At that time we will do hypercoagulable laboratory workup. Thank you

## 2014-09-15 ENCOUNTER — Encounter (HOSPITAL_BASED_OUTPATIENT_CLINIC_OR_DEPARTMENT_OTHER): Payer: Federal, State, Local not specified - PPO

## 2014-09-15 ENCOUNTER — Encounter (HOSPITAL_COMMUNITY): Payer: Federal, State, Local not specified - PPO | Attending: Hematology and Oncology

## 2014-09-15 ENCOUNTER — Encounter (HOSPITAL_COMMUNITY): Payer: Self-pay

## 2014-09-15 VITALS — BP 140/85 | HR 64 | Temp 97.4°F | Resp 20 | Wt 181.6 lb

## 2014-09-15 DIAGNOSIS — I87002 Postthrombotic syndrome without complications of left lower extremity: Secondary | ICD-10-CM | POA: Insufficient documentation

## 2014-09-15 DIAGNOSIS — K219 Gastro-esophageal reflux disease without esophagitis: Secondary | ICD-10-CM | POA: Diagnosis not present

## 2014-09-15 DIAGNOSIS — Z86711 Personal history of pulmonary embolism: Secondary | ICD-10-CM

## 2014-09-15 DIAGNOSIS — I82402 Acute embolism and thrombosis of unspecified deep veins of left lower extremity: Secondary | ICD-10-CM

## 2014-09-15 DIAGNOSIS — Z23 Encounter for immunization: Secondary | ICD-10-CM

## 2014-09-15 DIAGNOSIS — I82409 Acute embolism and thrombosis of unspecified deep veins of unspecified lower extremity: Secondary | ICD-10-CM

## 2014-09-15 LAB — CBC WITH DIFFERENTIAL/PLATELET
Basophils Absolute: 0 10*3/uL (ref 0.0–0.1)
Basophils Relative: 1 % (ref 0–1)
EOS ABS: 0.3 10*3/uL (ref 0.0–0.7)
EOS PCT: 4 % (ref 0–5)
HCT: 43.3 % (ref 36.0–46.0)
Hemoglobin: 14.9 g/dL (ref 12.0–15.0)
LYMPHS ABS: 2.5 10*3/uL (ref 0.7–4.0)
Lymphocytes Relative: 32 % (ref 12–46)
MCH: 31.5 pg (ref 26.0–34.0)
MCHC: 34.4 g/dL (ref 30.0–36.0)
MCV: 91.5 fL (ref 78.0–100.0)
Monocytes Absolute: 0.5 10*3/uL (ref 0.1–1.0)
Monocytes Relative: 6 % (ref 3–12)
Neutro Abs: 4.4 10*3/uL (ref 1.7–7.7)
Neutrophils Relative %: 57 % (ref 43–77)
PLATELETS: 328 10*3/uL (ref 150–400)
RBC: 4.73 MIL/uL (ref 3.87–5.11)
RDW: 13.2 % (ref 11.5–15.5)
WBC: 7.8 10*3/uL (ref 4.0–10.5)

## 2014-09-15 LAB — D-DIMER, QUANTITATIVE (NOT AT ARMC): D DIMER QUANT: 0.36 ug{FEU}/mL (ref 0.00–0.48)

## 2014-09-15 LAB — ANTITHROMBIN III: AntiThromb III Func: 98 % (ref 75–120)

## 2014-09-15 MED ORDER — INFLUENZA VAC SPLIT QUAD 0.5 ML IM SUSY
0.5000 mL | PREFILLED_SYRINGE | Freq: Once | INTRAMUSCULAR | Status: AC
  Administered 2014-09-15: 0.5 mL via INTRAMUSCULAR
  Filled 2014-09-15: qty 0.5

## 2014-09-15 NOTE — Progress Notes (Signed)
[facility]  OFFICE PROGRESS NOTE  Rudi HeapMOORE, DONALD, MD [address] [place] [id number]  DIAGNOSIS: Deep venous embolism and thrombosis of left lower extremity - Plan: Antithrombin III, Protein C activity, Protein C, total, Protein S activity, Protein S, total, Lupus anticoagulant panel, Beta-2-glycoprotein i abs, IgG/M/A, Homocysteine, serum, Factor 5 leiden, Prothrombin gene mutation, Cardiolipin antibodies, IgG, IgM, IgA, Influenza vac split quadrivalent PF (FLUARIX) injection 0.5 mL  Hx pulmonary embolism - Plan: Antithrombin III, Protein C activity, Protein C, total, Protein S activity, Protein S, total, Lupus anticoagulant panel, Beta-2-glycoprotein i abs, IgG/M/A, Homocysteine, serum, Factor 5 leiden, Prothrombin gene mutation, Cardiolipin antibodies, IgG, IgM, IgA, Influenza vac split quadrivalent PF (FLUARIX) injection 0.5 mL  DVT (deep venous thrombosis), unspecified laterality - Plan: Antithrombin III, Protein C activity, Protein C, total, Protein S activity, Protein S, total, Lupus anticoagulant panel, Beta-2-glycoprotein i abs, IgG/M/A, Homocysteine, serum, Factor 5 leiden, Prothrombin gene mutation, Cardiolipin antibodies, IgG, IgM, IgA, Influenza vac split quadrivalent PF (FLUARIX) injection 0.5 mL  Chief Complaint  Patient presents with  . left lower extremity DVT    CURRENT THERAPY: Eliquis 5 mg twice a day  INTERVAL HISTORY: Whitney Peters 53 y.o. female returns for follow-up of left lower extremity deep venous thrombosis associated with pulmonary embolism with the former diagnosed in early June 2015 and the latter diagnosed in September of 2014. Her therapy was changed from warfarin to Eliquis on 04/29/2014.  She stop eliquis 2 weeks ago and is here today to be evaluated and to have hypercoagulable workup performed. She does have occasional achiness involving the left thigh but no chest pain, PND, orthopnea, palpitations, lower  extremity swelling or redness, skin rash, joint pain, headache, or seizures.  MEDICAL HISTORY: Past Medical History  Diagnosis Date  . Anticoagulated on warfarin   . GERD (gastroesophageal reflux disease)   . Asthma   . 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    ALLERGIES:  is allergic to pradaxa and xarelto.  MEDICATIONS: has a current medication list which includes the following prescription(s): albuterol, apixaban, and benzonatate.  SURGICAL HISTORY:  Past Surgical History  Procedure Laterality Date  . Foot surgery Bilateral     BONE SPURS  . Knee surgery Right   . Appendectomy    . Abdominal hysterectomy      FAMILY HISTORY: family history includes Cancer in her sister; Heart disease in her father and mother.  SOCIAL HISTORY:  reports that she quit smoking about 22 years ago. She has never used smokeless tobacco. She reports that she does not drink alcohol or use illicit drugs.  REVIEW OF SYSTEMS:  Other than that discussed above is noncontributory.  PHYSICAL EXAMINATION: ECOG PERFORMANCE STATUS: 1 - Symptomatic but completely ambulatory  Blood pressure 140/85, pulse 64, temperature 97.4 F (36.3 C), resp. rate 20, weight 181 lb 9.6 oz (82.373 kg).  GENERAL:alert, no distress and comfortable SKIN: skin color, texture, turgor are normal, no rashes or significant lesions EYES: PERLA; Conjunctiva are pink and non-injected, sclera clear SINUSES: No redness or tenderness over maxillary or ethmoid sinuses OROPHARYNX:no exudate, no erythema on lips, buccal mucosa, or tongue. NECK: supple, thyroid normal size, non-tender, without nodularity. No masses CHEST: normal AP diameter  with no breast masses. LYMPH:  no palpable lymphadenopathy in the cervical,  axillary or inguinal LUNGS: clear to auscultation and percussion with normal breathing effort HEART: regular rate & rhythm and no murmurs. ABDOMEN:abdomen soft, non-tender and normal bowel sounds.  MUSCULOSKELETAL:no cyanosis of digits and no clubbing. Range of motion normal. Minimal tenderness over the left thigh. Homans sign negative. NEURO: alert & oriented x 3 with fluent speech, no focal motor/sensory deficits   LABORATORY DATA: Appointment on 09/15/2014  Component Date Value Ref Range Status  . WBC 09/15/2014 7.8  4.0 - 10.5 K/uL Final  . RBC 09/15/2014 4.73  3.87 - 5.11 MIL/uL Final  . Hemoglobin 09/15/2014 14.9  12.0 - 15.0 g/dL Final  . HCT 40/98/119111/08/2014 43.3  36.0 - 46.0 % Final  . MCV 09/15/2014 91.5  78.0 - 100.0 fL Final  . MCH 09/15/2014 31.5  26.0 - 34.0 pg Final  . MCHC 09/15/2014 34.4  30.0 - 36.0 g/dL Final  . RDW 47/82/956211/08/2014 13.2  11.5 - 15.5 % Final  . Platelets 09/15/2014 328  150 - 400 K/uL Final  . Neutrophils Relative % 09/15/2014 57  43 - 77 % Final  . Neutro Abs 09/15/2014 4.4  1.7 - 7.7 K/uL Final  . Lymphocytes Relative 09/15/2014 32  12 - 46 % Final  . Lymphs Abs 09/15/2014 2.5  0.7 - 4.0 K/uL Final  . Monocytes Relative 09/15/2014 6  3 - 12 % Final  . Monocytes Absolute 09/15/2014 0.5  0.1 - 1.0 K/uL Final  . Eosinophils Relative 09/15/2014 4  0 - 5 % Final  . Eosinophils Absolute 09/15/2014 0.3  0.0 - 0.7 K/uL Final  . Basophils Relative 09/15/2014 1  0 - 1 % Final  . Basophils Absolute 09/15/2014 0.0  0.0 - 0.1 K/uL Final    PATHOLOGY:no new pathology.  Urinalysis    Component Value Date/Time   BILIRUBINUR negative 01/22/2014 0931   PROTEINUR 4+ 01/22/2014 0931   UROBILINOGEN negative 01/22/2014 0931   NITRITE negative 01/22/2014 0931   LEUKOCYTESUR large (3+) 01/22/2014 0931    RADIOGRAPHIC STUDIES: [place] Venous Img Lower Unilateral Left  09/08/2014   CLINICAL DATA:  History of distal left femoral DVT. Evaluate for acute or chronic DVT.  Subsequent encounter.  EXAM: LEFT LOWER EXTREMITY VENOUS DOPPLER ULTRASOUND  TECHNIQUE: Gray-scale sonography with graded compression, as well as color Doppler and duplex ultrasound were performed to evaluate the lower extremity deep venous systems from the level of the common femoral vein and including the common femoral, femoral, profunda femoral, popliteal and calf veins including the posterior tibial, peroneal and gastrocnemius veins when visible. The superficial great saphenous vein was also interrogated. Spectral Doppler was utilized to evaluate flow at rest and with distal augmentation maneuvers in the common femoral, femoral and popliteal veins.  COMPARISON:  Left lower extremity venous Doppler ultrasound - 04/10/2014  FINDINGS: Contralateral Common Femoral Vein: Respiratory phasicity is normal and symmetric with the symptomatic side. No evidence of thrombus. Normal compressibility.  Common Femoral Vein: No evidence of thrombus. Normal compressibility, respiratory phasicity and response to augmentation.  Saphenofemoral Junction: No evidence of thrombus. Normal compressibility and flow on color Doppler imaging.  Profunda Femoral Vein: No evidence of thrombus. Normal compressibility and flow on color Doppler imaging.  Femoral Vein: No evidence of acute or chronic thrombus within the left femoral vein with special attention to the distal aspect of the femoral vein. Normal compressibility, respiratory phasicity and response to augmentation.  Popliteal Vein: No evidence of  thrombus. Normal compressibility, respiratory phasicity and response to augmentation.  Calf Veins: No evidence of thrombus. Normal compressibility and flow on color Doppler imaging.  Superficial Great Saphenous Vein: No evidence of thrombus. Normal compressibility and flow on color Doppler imaging.  Venous Reflux:  None.  Other Findings:  None.  IMPRESSION: No evidence of acute or chronic DVT within the left lower extremity.   Electronically  Signed   By: Simonne Come M.D.   On: 09/08/2014 11:59    ASSESSMENT:  #1. Deep venous thrombosis left lower 70, resolved. #2. History of pulmonary embolism in September 2014. #3Laurette Peters soft reflux disease, controlled. #4. Postphlebitic syndrome left lower extremity.   PLAN:  #1. Hypercoagulable panel was drawn today and if abnormal, eliquis will be restarted. If not, no further appointments will be made in this office.   All questions were answered. The patient knows to call the clinic with any problems, questions or concerns. We can certainly see the patient much sooner if necessary.   I spent 25 minutes counseling the patient face to face. The total time spent in the appointment was 30 minutes.    Maurilio Lovely, MD 09/15/2014 11:20 AM  DISCLAIMER:  This note was dictated with voice recognition software.  Similar sounding words can inadvertently be transcribed inaccurately and may not be corrected upon review.

## 2014-09-15 NOTE — Progress Notes (Signed)
LABS FOR DDIM,CBCD,ACARD,AT3,B2GI,F5LEID,HMCY,LUPANC,PROCT,PROST,PRTCF,PRSF,PTGENE

## 2014-09-15 NOTE — Patient Instructions (Signed)
[facility] Discharge Instructions  RECOMMENDATIONS MADE BY THE CONSULTANT AND ANY TEST RESULTS WILL BE SENT TO YOUR REFERRING PHYSICIAN.  EXAM FINDINGS BY THE PHYSICIAN TODAY AND SIGNS OR SYMPTOMS TO REPORT TO CLINIC OR PRIMARY PHYSICIAN:   IF labs are normal, you will not have to come back to this office---this will serve as your discharge papers from our office.   IF labs are abnormal, we will have you come back in for an appointment.   Flu shot given today.     Thank you for choosing [facility] to provide your oncology and hematology care.  To afford each patient quality time with our providers, please arrive at least 15 minutes before your scheduled appointment time.  With your help, our goal is to use those 15 minutes to complete the necessary work-up to ensure our physicians have the information they need to help with your evaluation and healthcare recommendations.    Effective January 1st, 2014, we ask that you re-schedule your appointment with our physicians should you arrive 10 or more minutes late for your appointment.  We strive to give you quality time with our providers, and arriving late affects you and other patients whose appointments are after yours.    Again, thank you for choosing [facility].  Our hope is that these requests will decrease the amount of time that you wait before being seen by our physicians.       _____________________________________________________________  Should you have questions after your visit to [facility], please contact our office at [phone] between the hours of 8:30 a.m. and 4:30 p.m.  Voicemails left after 4:30 p.m. will not be returned until the following business day.  For prescription refill requests, have your pharmacy contact our office with your prescription refill request.    _______________________________________________________________  We hope that we have given  you very good care.  You may receive a patient satisfaction survey in the mail, please complete it and return it as soon as possible.  We value your feedback!  _______________________________________________________________  Have you asked about our STAR program?  STAR stands for Survivorship Training and Rehabilitation, and this is a nationally recognized cancer care program that focuses on survivorship and rehabilitation.  Cancer and cancer treatments may cause problems, such as, pain, making you feel tired and keeping you from doing the things that you need or want to do. Cancer rehabilitation can help. Our goal is to reduce these troubling effects and help you have the best quality of life possible.  You may receive a survey from a nurse that asks questions about your current state of health.  Based on the survey results, all eligible patients will be referred to the [facility] for an evaluation so we can better serve you!  A frequently asked questions sheet is available upon request.

## 2014-09-16 LAB — HOMOCYSTEINE: HOMOCYSTEINE-NORM: 12.2 umol/L (ref 4.0–15.4)

## 2014-09-17 ENCOUNTER — Telehealth (HOSPITAL_COMMUNITY): Payer: Self-pay

## 2014-09-17 LAB — CARDIOLIPIN ANTIBODIES, IGG, IGM, IGA
ANTICARDIOLIPIN IGA: 8 U/mL — AB (ref <22)
ANTICARDIOLIPIN IGM: 0 [MPL'U]/mL — AB (ref <11)
Anticardiolipin IgG: 18 GPL U/mL (ref <23)

## 2014-09-17 LAB — PROTEIN S, TOTAL: Protein S Ag, Total: 89 % (ref 60–150)

## 2014-09-17 LAB — LUPUS ANTICOAGULANT PANEL
DRVVT: 36.6 secs (ref <42.9)
Lupus Anticoagulant: NOT DETECTED
PTT LA: 40.1 s (ref 28.0–43.0)

## 2014-09-17 LAB — BETA-2-GLYCOPROTEIN I ABS, IGG/M/A
BETA-2-GLYCOPROTEIN I IGA: 8 A Units (ref <20)
Beta-2 Glyco I IgG: 12 G Units (ref <20)
Beta-2-Glycoprotein I IgM: 6 M Units (ref <20)

## 2014-09-17 LAB — PROTEIN C ACTIVITY: Protein C Activity: 194 % — ABNORMAL HIGH (ref 75–133)

## 2014-09-17 LAB — PROTEIN S ACTIVITY: PROTEIN S ACTIVITY: 101 % (ref 69–129)

## 2014-09-17 LAB — PROTEIN C, TOTAL: Protein C, Total: 94 % (ref 72–160)

## 2014-09-17 NOTE — Telephone Encounter (Signed)
Patient notified and verbalized understanding of instructions. 

## 2014-09-17 NOTE — Telephone Encounter (Signed)
-----   Message from Alla GermanGregory Formanek, MD sent at 09/17/2014 12:06 PM EST ----- Please call the patient and notify her that the hypercoagulation workup was negative and that she need not take anticoagulants long-term. Thank you

## 2014-09-18 LAB — PROTHROMBIN GENE MUTATION

## 2014-09-18 LAB — FACTOR 5 LEIDEN

## 2014-12-01 ENCOUNTER — Encounter: Payer: Self-pay | Admitting: Orthopaedic Surgery

## 2014-12-01 ENCOUNTER — Encounter (HOSPITAL_COMMUNITY): Payer: Self-pay

## 2014-12-01 ENCOUNTER — Emergency Department (HOSPITAL_COMMUNITY)
Admission: EM | Admit: 2014-12-01 | Discharge: 2014-12-01 | Disposition: A | Discharge status: Home / Self Care | Payer: Federal, State, Local not specified - PPO | Attending: Emergency Medicine | Admitting: Emergency Medicine

## 2014-12-01 ENCOUNTER — Other Ambulatory Visit (HOSPITAL_COMMUNITY): Payer: Self-pay | Admitting: Orthopaedic Surgery

## 2014-12-01 ENCOUNTER — Encounter (HOSPITAL_COMMUNITY): Payer: Self-pay | Admitting: *Deleted

## 2014-12-01 ENCOUNTER — Emergency Department (HOSPITAL_COMMUNITY): Payer: Federal, State, Local not specified - PPO

## 2014-12-01 DIAGNOSIS — Z79899 Other long term (current) drug therapy: Secondary | ICD-10-CM | POA: Diagnosis not present

## 2014-12-01 DIAGNOSIS — W000XXA Fall on same level due to ice and snow, initial encounter: Secondary | ICD-10-CM | POA: Insufficient documentation

## 2014-12-01 DIAGNOSIS — Y9301 Activity, walking, marching and hiking: Secondary | ICD-10-CM | POA: Diagnosis not present

## 2014-12-01 DIAGNOSIS — S6991XA Unspecified injury of right wrist, hand and finger(s), initial encounter: Secondary | ICD-10-CM | POA: Diagnosis present

## 2014-12-01 DIAGNOSIS — Y998 Other external cause status: Secondary | ICD-10-CM | POA: Insufficient documentation

## 2014-12-01 DIAGNOSIS — Z7901 Long term (current) use of anticoagulants: Secondary | ICD-10-CM | POA: Diagnosis not present

## 2014-12-01 DIAGNOSIS — Z86718 Personal history of other venous thrombosis and embolism: Secondary | ICD-10-CM | POA: Diagnosis not present

## 2014-12-01 DIAGNOSIS — K219 Gastro-esophageal reflux disease without esophagitis: Secondary | ICD-10-CM | POA: Insufficient documentation

## 2014-12-01 DIAGNOSIS — Z87891 Personal history of nicotine dependence: Secondary | ICD-10-CM | POA: Diagnosis not present

## 2014-12-01 DIAGNOSIS — Z7902 Long term (current) use of antithrombotics/antiplatelets: Secondary | ICD-10-CM | POA: Diagnosis not present

## 2014-12-01 DIAGNOSIS — J45909 Unspecified asthma, uncomplicated: Secondary | ICD-10-CM | POA: Diagnosis not present

## 2014-12-01 DIAGNOSIS — Y92481 Parking lot as the place of occurrence of the external cause: Secondary | ICD-10-CM | POA: Diagnosis not present

## 2014-12-01 DIAGNOSIS — S52501A Unspecified fracture of the lower end of right radius, initial encounter for closed fracture: Secondary | ICD-10-CM | POA: Insufficient documentation

## 2014-12-01 MED ORDER — MORPHINE SULFATE 4 MG/ML IJ SOLN
4.0000 mg | Freq: Once | INTRAMUSCULAR | Status: AC
  Administered 2014-12-01: 4 mg via INTRAVENOUS
  Filled 2014-12-01: qty 1

## 2014-12-01 MED ORDER — HYDROMORPHONE HCL 1 MG/ML IJ SOLN
1.0000 mg | Freq: Once | INTRAMUSCULAR | Status: AC
Start: 2014-12-01 — End: 2014-12-01
  Administered 2014-12-01: 1 mg via INTRAVENOUS
  Filled 2014-12-01: qty 1

## 2014-12-01 MED ORDER — PERCOCET 5-325 MG PO TABS: 1.0000 | ORAL_TABLET | Freq: Four times a day (QID) | ORAL | Status: DC | PRN

## 2014-12-01 NOTE — Discharge Instructions (Signed)
Call Dr. Marlene BastYates's office this and is usually here for an appointment for this afternoon.  Return here as needed.  Keep the area elevated

## 2014-12-01 NOTE — ED Notes (Addendum)
Per EMS- Patient was walking across a parking lot and slipped, catching her fall with her right hand. Patient states she heard a pop and then c/o right wrist pain. Patient did not hit her head or have LOC.

## 2014-12-01 NOTE — H&P (Signed)
  [facility]   A Division [facility]   [address], [place], [place] [address] Telephone: [phone]  Fax: [phone]     PATIENT: Whitney Peters, Whitney   MR#: [id number]  DOB: July 06, 1961   Visit Date: 12/01/2014     HISTORY OF PRESENT ILLNESS:  A 54 year old white female comes in to the office for evaluation of a right distal radius fracture.  Patient is a Consulting civil engineerstudent at [employer] and states that this morning she was walking when she slipped, falling backwards, trying to catch herself on an outstretched right arm.  Since then she has had severe pain in her right wrist.  She is right-hand dominant.  [facility] and x-rays were done and this showed a severely comminuted, displaced distal radius fracture.  She was put in a volar wrist splint that was applied by the fire department and this was not changed at the ER.  Dr. Ophelia CharterYates had previously given instructions for the ER to place a sugar tong splint on her.  No other injuries.   CURRENT MEDICATIONS:  None.   ALLERGIES:  NKDA.   PAST MEDICAL/SURGICAL HISTORY:  Appendectomy 1989, left wrist surgery 1993, left knee surgery 1996, lower left foot surgery 1990, history of DVT and pulmonary embolism a couple years ago that required 6 months of oral anticoagulation.   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  She quit smoking, but did smoke for 17 years.  Denies alcohol consumption.   PHYSICAL EXAMINATION:  Height 5 feet 9 inches, weight 124 pounds.  Very pleasant, white female, alert and oriented x3 and in no acute distress.  Husband is present during exam.  Gait is normal.  Head is normocephalic and atraumatic.  Good cervical spine range of motion.  Right wrist:  She has a volar splint on.  She does have some wrist swelling and obviously the wrist is tender to palpation.  Moves fingers well.  Neurovascularly intact.  Skin:   Warm and dry.  No increase in respiratory effort.   RADIOGRAPHS/TEST:  X-rays reviewed from the [facility] system which shows a severely comminuted and displaced right distal radius fracture.   ASSESSMENT/DIAGNOSIS:  Right distal radius fracture.   PLAN:  Patient and her husband who was present advised that the best treatment option at this point would be open reduction and internal fixation procedure.  Surgical procedure along with the potential recovery time discussed.  All questions answered.  We will see about getting this put on the schedule for tomorrow.  Patient was put in a sugar tong splint.  She was given a script for Percocet at the ER.  All questions answered.   For additional information please see handwritten notes, reports, orders and prescriptions in this chart.   Electronically Approved by:    Zonia KiefJames Gwendalyn Mcgonagle, PA Veverly FellsMark C. Ophelia CharterYates, M.D.    Auto-Authenticated by Zonia KiefJames Yannely Kintzel, PA  JO/kw DD: 12/01/2014  DT: 12/01/2014

## 2014-12-01 NOTE — ED Provider Notes (Signed)
CSN: [id number]     Arrival date & time 12/01/14  4098 History   First MD Initiated Contact with Patient 12/01/14 [phone]     Chief Complaint  Patient presents with  . Fall  . Wrist Injury     (Consider location/radiation/quality/duration/timing/severity/associated sxs/prior Treatment) HPI Patient presents to the emergency department following a fall that occurs prior to arrival.  Patient states she was walking across a parking lot when she slipped on some ice.  He should states she landed on an outstretched hand and has complaints of right wrist pain and she felt a pop at the time of the injury.  Patient states that movement and palpation make the pain worse.  Patient states that she did not hit her head or have any other areas of injury.  Patient states that she did not take any medications prior to arrival for her pain.  EMS was called and they splinted her wrist.  Patient denies numbness, weakness, dizziness, headache, blurred vision, back pain, neck pain or loss of consciousness Past Medical History  Diagnosis Date  . Anticoagulated on warfarin   . GERD (gastroesophageal reflux disease)   . Asthma   . DVT (deep venous thrombosis)     left leg   Past Surgical History  Procedure Laterality Date  . Foot surgery Bilateral     BONE SPURS  . Knee surgery Right   . Appendectomy    . Abdominal hysterectomy     Family History  Problem Relation Age of Onset  . Heart disease Mother   . Heart disease Father     PACEMAKER  . Cancer Sister     BREAST   History  Substance Use Topics  . Smoking status: Former Smoker    Quit date: 11/07/1991  . Smokeless tobacco: Never Used  . Alcohol Use: No   OB History    No data available     Review of Systems  All other systems negative except as documented in the HPI. All pertinent positives and negatives as reviewed in the HPI.  Allergies  Xarelto and Pradaxa  Home Medications   Prior to Admission medications   Medication Sig Start  Date End Date Taking? Authorizing Provider  ranitidine (ZANTAC) 75 MG tablet Take 75 mg by mouth 2 (two) times daily.   Yes Historical Provider, MD  apixaban (ELIQUIS) 5 MG TABS tablet Take 1 tablet (5 mg total) by mouth 2 (two) times daily. Patient not taking: Reported on 12/01/2014 04/29/14   Alla German, MD  benzonatate (TESSALON) 200 MG capsule Take 1 capsule (200 mg total) by mouth 3 (three) times daily as needed. Patient not taking: Reported on 12/01/2014 08/20/14   Junie Spencer, FNP   BP 126/66 mmHg  Pulse 64  Temp(Src) 98 F (36.7 C) (Oral)  Resp 18  Ht  (1.753 m)  Wt 184 lb (83.462 kg)  BMI 27.16 kg/m2  SpO2 98% Physical Exam  Constitutional: She is oriented to person, place, and time. She appears well-developed and well-nourished. No distress.  HENT:  Head: Normocephalic and atraumatic.  Eyes: Pupils are equal, round, and reactive to light.  Cardiovascular: Normal rate, regular rhythm and normal heart sounds.  Exam reveals no gallop and no friction rub.   No murmur heard. Pulmonary/Chest: Effort normal and breath sounds normal. No respiratory distress.  Musculoskeletal:       Right wrist: She exhibits decreased range of motion, tenderness, bony tenderness, swelling and deformity. She exhibits no laceration.  Arms: Neurological: She is alert and oriented to person, place, and time. She exhibits normal muscle tone. Coordination normal.  Skin: Skin is warm and dry.    ED Course  Procedures (including critical care time) Labs Review Labs Reviewed - No data to display  Imaging Review Dg Wrist Complete Right  12/01/2014   CLINICAL DATA:  Acute right wrist pain after slipping on ice in parking lot today. Initial encounter.  EXAM: RIGHT WRIST - COMPLETE 3+ VIEW  COMPARISON:  None.  FINDINGS: Severely comminuted and displaced fracture is seen involving the distal right radius, with posterior displacement of the distal fracture fragments. This appears to be closed  and posttraumatic.  IMPRESSION: Severely comminuted and posteriorly displaced distal right radial fracture.   Electronically Signed   By: Roque LiasJames  Green M.D.   On: 12/01/2014 09:27      Patient is placed in a sugar tong splint and I spoke with Dr. Ophelia CharterYates from orthopedics, who will see the patient in follow-up today or tomorrow.  I advised the patient that she is call the office for an appointment later today or tomorrow.  Patient agrees the plan and all questions were answered  SPLINT APPLICATION Date/Time: 12:07 PM Authorized by: Carlyle DollyLAWYER,Breea Loncar W Consent: Verbal consent obtained. Risks and benefits: risks, benefits and alternatives were discussed Consent given by: patient Splint applied by: orthopedic technician Location details: R wrist Splint type: Sugartong Supplies used: Fiberglass Webb rolling and Ace wrap  Post-procedure: The splinted body part was neurovascularly unchanged following the procedure. Patient tolerance: Patient tolerated the procedure well with no immediate complications.       Carlyle DollyChristopher W Torrin Crihfield, PA-C 12/01/14 [id number]  Suzi RootsKevin E Steinl, MD 12/01/14 [phone]

## 2014-12-01 NOTE — ED Notes (Signed)
Bed: WA03 Expected date:  Expected time:  Means of arrival:  Comments: EMS-fall-wrist injury

## 2014-12-02 ENCOUNTER — Ambulatory Visit (HOSPITAL_COMMUNITY): Payer: Federal, State, Local not specified - PPO | Admitting: Anesthesiology

## 2014-12-02 ENCOUNTER — Ambulatory Visit (HOSPITAL_COMMUNITY): Payer: Federal, State, Local not specified - PPO

## 2014-12-02 ENCOUNTER — Encounter (HOSPITAL_COMMUNITY): Payer: Self-pay | Admitting: *Deleted

## 2014-12-02 ENCOUNTER — Observation Stay (HOSPITAL_COMMUNITY)
Admission: RE | Admit: 2014-12-02 | Discharge: 2014-12-03 | Disposition: A | Discharge status: Home / Self Care | Payer: Federal, State, Local not specified - PPO | Source: Ambulatory Visit | Attending: Orthopaedic Surgery | Admitting: Orthopaedic Surgery

## 2014-12-02 ENCOUNTER — Encounter (HOSPITAL_COMMUNITY)
Admission: RE | Disposition: A | Discharge status: Home / Self Care | Payer: Self-pay | Source: Ambulatory Visit | Attending: Orthopaedic Surgery

## 2014-12-02 DIAGNOSIS — Z87891 Personal history of nicotine dependence: Secondary | ICD-10-CM | POA: Insufficient documentation

## 2014-12-02 DIAGNOSIS — I739 Peripheral vascular disease, unspecified: Secondary | ICD-10-CM | POA: Diagnosis not present

## 2014-12-02 DIAGNOSIS — Z86711 Personal history of pulmonary embolism: Secondary | ICD-10-CM | POA: Insufficient documentation

## 2014-12-02 DIAGNOSIS — Y929 Unspecified place or not applicable: Secondary | ICD-10-CM | POA: Insufficient documentation

## 2014-12-02 DIAGNOSIS — Z01818 Encounter for other preprocedural examination: Secondary | ICD-10-CM

## 2014-12-02 DIAGNOSIS — Z86718 Personal history of other venous thrombosis and embolism: Secondary | ICD-10-CM | POA: Insufficient documentation

## 2014-12-02 DIAGNOSIS — S62109A Fracture of unspecified carpal bone, unspecified wrist, initial encounter for closed fracture: Secondary | ICD-10-CM | POA: Diagnosis present

## 2014-12-02 DIAGNOSIS — W1830XA Fall on same level, unspecified, initial encounter: Secondary | ICD-10-CM | POA: Insufficient documentation

## 2014-12-02 DIAGNOSIS — S52501A Unspecified fracture of the lower end of right radius, initial encounter for closed fracture: Secondary | ICD-10-CM | POA: Diagnosis present

## 2014-12-02 HISTORY — PX: OPEN REDUCTION INTERNAL FIXATION (ORIF) DISTAL RADIAL FRACTURE: SHX5989

## 2014-12-02 HISTORY — DX: Other pulmonary embolism without acute cor pulmonale: I26.99

## 2014-12-02 HISTORY — DX: Presence of external hearing-aid: Z97.4

## 2014-12-02 LAB — COMPREHENSIVE METABOLIC PANEL
ALT: 19 U/L (ref 0–35)
ANION GAP: 6 (ref 5–15)
AST: 21 U/L (ref 0–37)
Albumin: 3.8 g/dL (ref 3.5–5.2)
Alkaline Phosphatase: 91 U/L (ref 39–117)
BUN: 8 mg/dL (ref 6–23)
CHLORIDE: 106 mmol/L (ref 96–112)
CO2: 26 mmol/L (ref 19–32)
CREATININE: 0.89 mg/dL (ref 0.50–1.10)
Calcium: 8.9 mg/dL (ref 8.4–10.5)
GFR calc Af Amer: 84 mL/min — ABNORMAL LOW (ref >90)
GFR calc non Af Amer: 72 mL/min — ABNORMAL LOW (ref >90)
GLUCOSE: 89 mg/dL (ref 70–99)
Potassium: 3.8 mmol/L (ref 3.5–5.1)
Sodium: 138 mmol/L (ref 135–145)
Total Bilirubin: 0.5 mg/dL (ref 0.3–1.2)
Total Protein: 7.1 g/dL (ref 6.0–8.3)

## 2014-12-02 LAB — CBC
HCT: 40.4 % (ref 36.0–46.0)
Hemoglobin: 13.4 g/dL (ref 12.0–15.0)
MCH: 30.4 pg (ref 26.0–34.0)
MCHC: 33.2 g/dL (ref 30.0–36.0)
MCV: 91.6 fL (ref 78.0–100.0)
PLATELETS: 281 10*3/uL (ref 150–400)
RBC: 4.41 MIL/uL (ref 3.87–5.11)
RDW: 13.3 % (ref 11.5–15.5)
WBC: 9 10*3/uL (ref 4.0–10.5)

## 2014-12-02 LAB — PROTIME-INR
INR: 1 (ref 0.00–1.49)
Prothrombin Time: 13.3 seconds (ref 11.6–15.2)

## 2014-12-02 SURGERY — OPEN REDUCTION INTERNAL FIXATION (ORIF) DISTAL RADIUS FRACTURE
Anesthesia: Regional | Laterality: Right

## 2014-12-02 MED ORDER — LIDOCAINE HCL (CARDIAC) 20 MG/ML IV SOLN
INTRAVENOUS | Status: DC | PRN
  Administered 2014-12-02: 60 mg via INTRAVENOUS

## 2014-12-02 MED ORDER — OXYCODONE-ACETAMINOPHEN 10-325 MG PO TABS: 1.0000 | ORAL_TABLET | Freq: Four times a day (QID) | ORAL | Status: DC | PRN

## 2014-12-02 MED ORDER — MIDAZOLAM HCL 2 MG/2ML IJ SOLN
1.0000 mg | INTRAMUSCULAR | Status: DC | PRN
  Administered 2014-12-02: 1 mg via INTRAVENOUS

## 2014-12-02 MED ORDER — BUPIVACAINE HCL (PF) 0.25 % IJ SOLN
INTRAMUSCULAR | Status: DC | PRN
  Administered 2014-12-02: 20 mL

## 2014-12-02 MED ORDER — MIDAZOLAM HCL 2 MG/2ML IJ SOLN
INTRAMUSCULAR | Status: AC
  Filled 2014-12-02: qty 2

## 2014-12-02 MED ORDER — PROMETHAZINE HCL 25 MG/ML IJ SOLN: 6.2500 mg | INTRAMUSCULAR | Status: DC | PRN

## 2014-12-02 MED ORDER — OXYCODONE-ACETAMINOPHEN 5-325 MG PO TABS: 1.0000 | ORAL_TABLET | ORAL | Status: DC | PRN

## 2014-12-02 MED ORDER — FENTANYL CITRATE 0.05 MG/ML IJ SOLN
INTRAMUSCULAR | Status: AC
  Filled 2014-12-02: qty 5

## 2014-12-02 MED ORDER — METOCLOPRAMIDE HCL 5 MG PO TABS: 5.0000 mg | ORAL_TABLET | Freq: Three times a day (TID) | ORAL | Status: DC | PRN

## 2014-12-02 MED ORDER — TEMAZEPAM 15 MG PO CAPS
15.0000 mg | ORAL_CAPSULE | Freq: Every evening | ORAL | Status: DC | PRN
Start: 2014-12-02 — End: 2014-12-03

## 2014-12-02 MED ORDER — ONDANSETRON HCL 4 MG PO TABS: 4.0000 mg | ORAL_TABLET | Freq: Four times a day (QID) | ORAL | Status: DC | PRN

## 2014-12-02 MED ORDER — BUPIVACAINE-EPINEPHRINE (PF) 0.5% -1:200000 IJ SOLN
INTRAMUSCULAR | Status: DC | PRN
  Administered 2014-12-02: 30 mL via PERINEURAL

## 2014-12-02 MED ORDER — FENTANYL CITRATE 0.05 MG/ML IJ SOLN
50.0000 ug | INTRAMUSCULAR | Status: DC | PRN
  Administered 2014-12-02: 50 ug via INTRAVENOUS

## 2014-12-02 MED ORDER — CEFAZOLIN SODIUM-DEXTROSE 2-3 GM-% IV SOLR
2.0000 g | INTRAVENOUS | Status: AC
  Administered 2014-12-02: 2 g via INTRAVENOUS
  Filled 2014-12-02: qty 50

## 2014-12-02 MED ORDER — PROPOFOL 10 MG/ML IV BOLUS
INTRAVENOUS | Status: DC | PRN
  Administered 2014-12-02: 50 mg via INTRAVENOUS
  Administered 2014-12-02: 150 mg via INTRAVENOUS

## 2014-12-02 MED ORDER — LACTATED RINGERS IV SOLN
INTRAVENOUS | Status: DC | PRN
  Administered 2014-12-02: 18:00:00 via INTRAVENOUS

## 2014-12-02 MED ORDER — POTASSIUM CHLORIDE IN NACL 20-0.45 MEQ/L-% IV SOLN
INTRAVENOUS | Status: DC
  Administered 2014-12-02: 21:00:00 via INTRAVENOUS
  Filled 2014-12-02 (×2): qty 1000

## 2014-12-02 MED ORDER — DEXAMETHASONE SODIUM PHOSPHATE 4 MG/ML IJ SOLN
INTRAMUSCULAR | Status: DC | PRN
  Administered 2014-12-02: 4 mg via INTRAVENOUS

## 2014-12-02 MED ORDER — METOCLOPRAMIDE HCL 5 MG/ML IJ SOLN: 5.0000 mg | Freq: Three times a day (TID) | INTRAMUSCULAR | Status: DC | PRN

## 2014-12-02 MED ORDER — MIDAZOLAM HCL 5 MG/5ML IJ SOLN
INTRAMUSCULAR | Status: DC | PRN
  Administered 2014-12-02: 2 mg via INTRAVENOUS

## 2014-12-02 MED ORDER — METHOCARBAMOL 500 MG PO TABS: 500.0000 mg | ORAL_TABLET | Freq: Four times a day (QID) | ORAL | Status: DC | PRN

## 2014-12-02 MED ORDER — MIDAZOLAM HCL 2 MG/2ML IJ SOLN
INTRAMUSCULAR | Status: AC
Start: 2014-12-02 — End: 2014-12-02
  Administered 2014-12-02: 1 mg via INTRAVENOUS
  Filled 2014-12-02: qty 2

## 2014-12-02 MED ORDER — LACTATED RINGERS IV SOLN
INTRAVENOUS | Status: DC
  Administered 2014-12-02: 15:00:00 via INTRAVENOUS

## 2014-12-02 MED ORDER — FENTANYL CITRATE 0.05 MG/ML IJ SOLN
INTRAMUSCULAR | Status: DC | PRN
  Administered 2014-12-02 (×5): 50 ug via INTRAVENOUS

## 2014-12-02 MED ORDER — HYDROMORPHONE HCL 1 MG/ML IJ SOLN: 0.2500 mg | INTRAMUSCULAR | Status: DC | PRN

## 2014-12-02 MED ORDER — ONDANSETRON HCL 4 MG/2ML IJ SOLN
INTRAMUSCULAR | Status: DC | PRN
  Administered 2014-12-02: 4 mg via INTRAVENOUS

## 2014-12-02 MED ORDER — BUPIVACAINE HCL (PF) 0.25 % IJ SOLN
INTRAMUSCULAR | Status: AC
  Filled 2014-12-02: qty 30

## 2014-12-02 MED ORDER — ONDANSETRON 4 MG PO TBDP: 4.0000 mg | ORAL_TABLET | Freq: Three times a day (TID) | ORAL | Status: DC | PRN

## 2014-12-02 MED ORDER — ONDANSETRON HCL 4 MG/2ML IJ SOLN: 4.0000 mg | Freq: Four times a day (QID) | INTRAMUSCULAR | Status: DC | PRN

## 2014-12-02 MED ORDER — OXYCODONE HCL 5 MG/5ML PO SOLN: 5.0000 mg | Freq: Once | ORAL | Status: DC | PRN

## 2014-12-02 MED ORDER — METHOCARBAMOL 1000 MG/10ML IJ SOLN
500.0000 mg | Freq: Four times a day (QID) | INTRAVENOUS | Status: DC | PRN
  Filled 2014-12-02: qty 5

## 2014-12-02 MED ORDER — HYDROMORPHONE HCL 1 MG/ML IJ SOLN: 1.0000 mg | INTRAMUSCULAR | Status: DC | PRN

## 2014-12-02 MED ORDER — PHENYLEPHRINE HCL 10 MG/ML IJ SOLN
INTRAMUSCULAR | Status: DC | PRN
  Administered 2014-12-02 (×2): 40 ug via INTRAVENOUS

## 2014-12-02 MED ORDER — FENTANYL CITRATE 0.05 MG/ML IJ SOLN
INTRAMUSCULAR | Status: AC
  Administered 2014-12-02: 50 ug via INTRAVENOUS
  Filled 2014-12-02: qty 2

## 2014-12-02 MED ORDER — OXYCODONE HCL 5 MG PO TABS: 5.0000 mg | ORAL_TABLET | Freq: Once | ORAL | Status: DC | PRN

## 2014-12-02 SURGICAL SUPPLY — 66 items
BANDAGE ELASTIC 4 VELCRO ST LF (GAUZE/BANDAGES/DRESSINGS) ×4 IMPLANT
BIT DRILL 2 FAST STEP (BIT) ×2 IMPLANT
BIT DRILL 2.5X4 QC (BIT) ×2 IMPLANT
BNDG ESMARK 4X9 LF (GAUZE/BANDAGES/DRESSINGS) IMPLANT
CORDS BIPOLAR (ELECTRODE) ×2 IMPLANT
COVER SURGICAL LIGHT HANDLE (MISCELLANEOUS) ×2 IMPLANT
DRAPE OEC MINIVIEW 54X84 (DRAPES) IMPLANT
DRSG PAD ABDOMINAL 8X10 ST (GAUZE/BANDAGES/DRESSINGS) ×4 IMPLANT
DURAPREP 26ML APPLICATOR (WOUND CARE) ×2 IMPLANT
ELECT REM PT RETURN 9FT ADLT (ELECTROSURGICAL) ×2
ELECTRODE REM PT RTRN 9FT ADLT (ELECTROSURGICAL) ×1 IMPLANT
GAUZE SPONGE 4X4 12PLY STRL (GAUZE/BANDAGES/DRESSINGS) IMPLANT
GAUZE XEROFORM 1X8 LF (GAUZE/BANDAGES/DRESSINGS) IMPLANT
GLOVE BIOGEL PI IND STRL 7.5 (GLOVE) ×1 IMPLANT
GLOVE BIOGEL PI IND STRL 8 (GLOVE) ×1 IMPLANT
GLOVE BIOGEL PI INDICATOR 7.5 (GLOVE) ×1
GLOVE BIOGEL PI INDICATOR 8 (GLOVE) ×1
GLOVE ECLIPSE 7.0 STRL STRAW (GLOVE) ×2 IMPLANT
GLOVE ORTHO TXT STRL SZ7.5 (GLOVE) ×2 IMPLANT
GOWN STRL REUS W/ TWL LRG LVL3 (GOWN DISPOSABLE) ×1 IMPLANT
GOWN STRL REUS W/ TWL XL LVL3 (GOWN DISPOSABLE) ×1 IMPLANT
GOWN STRL REUS W/TWL LRG LVL3 (GOWN DISPOSABLE) ×1
GOWN STRL REUS W/TWL XL LVL3 (GOWN DISPOSABLE) ×1
K-WIRE 1.4X100 (WIRE)
K-WIRE 1.6 (WIRE) ×1
K-WIRE FX5X1.6XNS BN SS (WIRE) ×1
KIT BASIN OR (CUSTOM PROCEDURE TRAY) ×2 IMPLANT
KIT ROOM TURNOVER OR (KITS) ×2 IMPLANT
KWIRE 1.4X100 (WIRE) IMPLANT
KWIRE FX5X1.6XNS BN SS (WIRE) ×1 IMPLANT
MANIFOLD NEPTUNE II (INSTRUMENTS) ×2 IMPLANT
NEEDLE 22X1 1/2 (OR ONLY) (NEEDLE) IMPLANT
NS IRRIG 1000ML POUR BTL (IV SOLUTION) ×2 IMPLANT
PACK ORTHO EXTREMITY (CUSTOM PROCEDURE TRAY) ×2 IMPLANT
PAD ARMBOARD 7.5X6 YLW CONV (MISCELLANEOUS) ×4 IMPLANT
PAD CAST 4YDX4 CTTN HI CHSV (CAST SUPPLIES) IMPLANT
PADDING CAST ABS 4INX4YD NS (CAST SUPPLIES) ×2
PADDING CAST ABS COTTON 4X4 ST (CAST SUPPLIES) ×2 IMPLANT
PADDING CAST COTTON 4X4 STRL (CAST SUPPLIES)
PEG SUBCHONDRAL SMOOTH 2.0X18 (Peg) ×2 IMPLANT
PEG SUBCHONDRAL SMOOTH 2.0X20 (Peg) ×2 IMPLANT
PEG SUBCHONDRAL SMOOTH 2.0X22 (Peg) ×8 IMPLANT
PLATE STAN 24.4X59.5 RT (Plate) ×2 IMPLANT
SCREW BN 12X3.5XNS CORT TI (Screw) ×1 IMPLANT
SCREW CORT 3.5X10 LNG (Screw) ×2 IMPLANT
SCREW CORT 3.5X12 (Screw) ×1 IMPLANT
SCREW MULTI DIRECT 18MM (Screw) ×2 IMPLANT
SCREW MULTI DIRECT 20MM (Screw) ×2 IMPLANT
SPLINT FIBERGLASS 4X30 (CAST SUPPLIES) ×2 IMPLANT
SPONGE GAUZE 4X4 12PLY STER LF (GAUZE/BANDAGES/DRESSINGS) ×2 IMPLANT
SPONGE LAP 4X18 X RAY DECT (DISPOSABLE) ×4 IMPLANT
STAPLER VISISTAT 35W (STAPLE) ×2 IMPLANT
STRIP CLOSURE SKIN 1/2X4 (GAUZE/BANDAGES/DRESSINGS) ×2 IMPLANT
SUCTION FRAZIER TIP 10 FR DISP (SUCTIONS) ×2 IMPLANT
SUT PROLENE 3 0 PS 1 (SUTURE) ×2 IMPLANT
SUT VIC AB 2-0 CT1 27 (SUTURE)
SUT VIC AB 2-0 CT1 TAPERPNT 27 (SUTURE) IMPLANT
SUT VIC AB 3-0 X1 27 (SUTURE) ×2 IMPLANT
SUT VICRYL 4-0 PS2 18IN ABS (SUTURE) IMPLANT
SYR CONTROL 10ML LL (SYRINGE) IMPLANT
TOWEL OR 17X24 6PK STRL BLUE (TOWEL DISPOSABLE) ×2 IMPLANT
TOWEL OR 17X26 10 PK STRL BLUE (TOWEL DISPOSABLE) ×2 IMPLANT
TUBE CONNECTING 12X1/4 (SUCTIONS) ×2 IMPLANT
UNDERPAD 30X30 INCONTINENT (UNDERPADS AND DIAPERS) ×2 IMPLANT
WATER STERILE IRR 1000ML POUR (IV SOLUTION) ×2 IMPLANT
YANKAUER SUCT BULB TIP NO VENT (SUCTIONS) ×2 IMPLANT

## 2014-12-02 NOTE — Transfer of Care (Signed)
Immediate Anesthesia Transfer of Care Note  Patient: Whitney Peters  Procedure(s) Performed: Procedure(s): OPEN REDUCTION INTERNAL FIXATION (ORIF) DISTAL RADIAL FRACTURE (Right)  Patient Location: PACU  Anesthesia Type:GA combined with regional for post-op pain  Level of Consciousness: awake, oriented, sedated, patient cooperative and responds to stimulation  Airway & Oxygen Therapy: Patient Spontanous Breathing and Patient connected to nasal cannula oxygen  Post-op Assessment: Report given to PACU RN, Post -op Vital signs reviewed and stable, Patient moving all extremities and Patient moving all extremities X 4  Post vital signs: Reviewed and stable  Last Vitals:  Filed Vitals:   12/02/14 1620  BP: 144/61  Pulse: 80  Temp:   Resp: 21    Complications: No apparent anesthesia complications

## 2014-12-02 NOTE — Anesthesia Postprocedure Evaluation (Signed)
Anesthesia Post Note  Patient: [name], [name]  Procedure(s) Performed: Procedure(s) (LRB): OPEN REDUCTION INTERNAL FIXATION (ORIF) DISTAL RADIAL FRACTURE (Right)  Anesthesia type: general  Patient location: PACU  Post pain: Pain level controlled  Post assessment: Patient's Cardiovascular Status Stable  Last Vitals:  Filed Vitals:   12/02/14 2110  BP: 107/81  Pulse: 56  Temp: 36.6 C  Resp: 16    Post vital signs: Reviewed and stable  Level of consciousness: sedated  Complications: No apparent anesthesia complications

## 2014-12-02 NOTE — Interval H&P Note (Signed)
History and Physical Interval Note:  12/02/2014 5:37 PM  Whitney Peters  has presented today for surgery, with the diagnosis of Right Distal Radius Fracture  The various methods of treatment have been discussed with the patient and family. After consideration of risks, benefits and other options for treatment, the patient has consented to  Procedure(s): OPEN REDUCTION INTERNAL FIXATION (ORIF) DISTAL RADIAL FRACTURE (Right) as a surgical intervention .  The patient's history has been reviewed, patient examined, no change in status, stable for surgery.  I have reviewed the patient's chart and labs.  Questions were answered to the patient's satisfaction.     [name] C

## 2014-12-02 NOTE — Anesthesia Preprocedure Evaluation (Addendum)
Anesthesia Evaluation  Patient identified by MRN, date of birth, ID band Patient awake    Reviewed: Allergy & Precautions, NPO status , Patient's Chart, lab work & pertinent test results  Airway Mallampati: I  TM Distance: >3 FB Neck ROM: Full    Dental  (+) Edentulous Upper, Missing   Pulmonary asthma , former smoker,          Cardiovascular + Peripheral Vascular Disease  Hx of PE/ DVT- Off anticoagulation x 36mo.   Neuro/Psych negative neurological ROS     GI/Hepatic Neg liver ROS, GERD-  ,  Endo/Other  negative endocrine ROS  Renal/GU negative Renal ROS     Musculoskeletal   Abdominal   Peds  Hematology negative hematology ROS (+)   Anesthesia Other Findings   Reproductive/Obstetrics                            Anesthesia Physical Anesthesia Plan  ASA: II  Anesthesia Plan: General and Regional   Post-op Pain Management:    Induction: Intravenous  Airway Management Planned: LMA  Additional Equipment:   Intra-op Plan:   Post-operative Plan: Extubation in OR  Informed Consent: I have reviewed the patients History and Physical, chart, labs and discussed the procedure including the risks, benefits and alternatives for the proposed anesthesia with the patient or authorized representative who has indicated his/her understanding and acceptance.     Plan Discussed with: CRNA  Anesthesia Plan Comments:        Anesthesia Quick Evaluation

## 2014-12-02 NOTE — Progress Notes (Signed)
Orthopedic Tech Progress Note Patient Details:  Garen LahLoretta Ogarro 21-Apr-1961 [id number]  Ortho Devices Type of Ortho Device: Arm sling Ortho Device/Splint Location: RUE Ortho Device/Splint Interventions: Ordered, Application   Jennye MoccasinHughes, Kagen Kunath Craig 12/02/2014, 8:15 PM

## 2014-12-02 NOTE — Anesthesia Procedure Notes (Addendum)
Anesthesia Regional Block:  Supraclavicular block  Pre-Anesthetic Checklist: ,, timeout performed, Correct Patient, Correct Site, Correct Laterality, Correct Procedure, Correct Position, site marked, Risks and benefits discussed,  Surgical consent,  Pre-op evaluation,  At surgeon's request and post-op pain management  Laterality: Right  Prep: chloraprep       Needles:  Injection technique: Single-shot  Needle Type: Stimiplex     Needle Length: 9cm 9 cm Needle Gauge: 21 and 21 G    Additional Needles:  Procedures: ultrasound guided (picture in chart) and nerve stimulator Supraclavicular block  Nerve Stimulator or Paresthesia:  Response: biceps, 0.5 mA,  Response: triceps, 0.5 mA,   Additional Responses:   Narrative:  Start time: 12/02/2014 4:00 PM End time: 12/02/2014 4:10 PM Injection made incrementally with aspirations every 5 mL.  Events: injection painful  Performed by: Personally  Anesthesiologist: FITZGERALD, ROBERT E  Additional Notes: Risks, benefits and alternative to block explained extensively.  Patient tolerated procedure well, without complications.   Procedure Name: LMA Insertion Date/Time: 12/02/2014 6:08 PM Performed by: Adonis HousekeeperNGELL, JANNA M Pre-anesthesia Checklist: Patient identified, Emergency Drugs available, Suction available, Patient being monitored and Timeout performed Patient Re-evaluated:Patient Re-evaluated prior to inductionOxygen Delivery Method: Circle system utilized Preoxygenation: Pre-oxygenation with 100% oxygen Intubation Type: IV induction Ventilation: Mask ventilation without difficulty LMA: LMA inserted LMA Size: 4.0 Tube type: Oral Number of attempts: 1 Placement Confirmation: breath sounds checked- equal and bilateral and positive ETCO2 Tube secured with: Tape Dental Injury: Teeth and Oropharynx as per pre-operative assessment

## 2014-12-03 ENCOUNTER — Encounter (HOSPITAL_COMMUNITY): Payer: Self-pay | Admitting: Orthopaedic Surgery

## 2014-12-03 DIAGNOSIS — S52501A Unspecified fracture of the lower end of right radius, initial encounter for closed fracture: Secondary | ICD-10-CM | POA: Diagnosis not present

## 2014-12-03 NOTE — Progress Notes (Signed)
Discharge home. Home discharge instruction given, no questions verbalized. 

## 2014-12-03 NOTE — Progress Notes (Signed)
Subjective: 1 Day Post-Op Procedure(s) (LRB): OPEN REDUCTION INTERNAL FIXATION (ORIF) DISTAL RADIAL FRACTURE (Right) Patient reports pain as 0 on 0-10 scale.    Objective: Vital signs in last 24 hours: Temp:  [97.9 F (36.6 C)-98.4 F (36.9 C)] 98 F (36.7 C) (01/28 0610) Pulse Rate:  [54-83] 66 (01/28 0610) Resp:  [8-25] 18 (01/28 0610) BP: (107-156)/(59-84) 108/59 mmHg (01/28 0610) SpO2:  [94 %-100 %] 94 % (01/28 0610) Weight:  [83.462 kg (184 lb)] 83.462 kg (184 lb) (01/27 1411)  Intake/Output from previous day: 01/27 0701 - 01/28 0700 In: 2040 [P.O.:480; I.V.:1560] Out: 1675 [Urine:1650; Blood:25] Intake/Output this shift:     Recent Labs  12/02/14 1408  HGB 13.4    Recent Labs  12/02/14 1408  WBC 9.0  RBC 4.41  HCT 40.4  PLT 281    Recent Labs  12/02/14 1408  NA 138  K 3.8  CL 106  CO2 26  BUN 8  CREATININE 0.89  GLUCOSE 89  CALCIUM 8.9    Recent Labs  12/02/14 1408  INR 1.00    block working still no motion no sensation to hand. no pain  Assessment/Plan: 1 Day Post-Op Procedure(s) (LRB): OPEN REDUCTION INTERNAL FIXATION (ORIF) DISTAL RADIAL FRACTURE (Right) Plan  Discharge home.   Office one week  Stepahnie Campo C 12/03/2014, 7:45 AM

## 2014-12-04 NOTE — Op Note (Signed)
NAMGaren Lah:  Peters, Whitney Peters           ACCOUNT NO.:  [id number]  MEDICAL RECORD NO.:  [id number]  LOCATION:  [id number]                        FACILITY:  [facility]  PHYSICIAN:  Veleka Djordjevic C. Ophelia CharterYates, M.D.    DATE OF BIRTH:  [birth year]  DATE OF PROCEDURE:  12/02/2014 DATE OF DISCHARGE:  12/03/2014                              OPERATIVE REPORT   PREOPERATIVE DIAGNOSIS:  Angulated displaced comminuted right distal radius fracture, comminuted with 2 separate intra-articular fragments.  POSTOPERATIVE DIAGNOSIS:  Angulated displaced comminuted right distal radius fracture, comminuted with 2 separate intra-articular fragments.  PROCEDURE:  Open reduction and internal fixation, right distal radius.  SURGEON:  Annell GreeningMark Noelene Gang, MD.  ASSISTANT:  Zonia KiefJames Owens, PA-C; medically necessary and present for the entire procedure.  TOURNIQUET TIME:  Less than 40 minutes.  DRAINS:  None.  COMPLICATIONS:  None.  INDICATION FOR PROCEDURE:  A 54 year old female fell with displaced angulated distal radius fracture with greater than 60 degrees angulation.  She had moderate swelling, intact median nerve sensation, no history of carpal tunnel syndrome.  PROCEDURE IN DETAIL:  After induction of general anesthesia and informed consent, proximal arm tourniquet, DuraPrep, Ancef prophylaxis, extremity sheets and drapes, sterile skin marker was used.  Time-out procedure completed.  Arm was wrapped in Esmarch, tourniquet inflated.  Incision was made over the FCR tendon.  Posterior sheath was divided pulling the FCR toward the radial side protecting the radial artery.  Pronator was peeled off the radial aspect of the radius.  Fracture site was noted. Hematoma was evacuated.  Distraction, reduction, selection of standard right volar DVR plate.  It was initially pinned in good position, screw hole was inserted.  There was slight rotation of the plate with the proximal aspect being along the edge of the cortex.  It was adjusted and if  the plate was angled to correct the rotation, the plate hung over toward the ulna with the patient's small size.  The distal pin was placed which showed it was in good position, captured the distal fragment.  Combination of 18-22 length pegs were placed.  The multi- variable angulation screw was used for the styloid and this came right to the edge of the cortex and so the distal row styloid screw was left out.  There was excellent position of the distal radius, it was out to length.  Distal RU joint was reduced and the 2 distal fragments and the intra-articular piece did not show any displacement and were anatomic. Total of 2 proximal screws were placed, one in the slotted hole, the other in the proximal-most hole.  Position looked good, irrigation.  The proximal edge of the plate just hung over the edge of the cortex but the reduction was anatomic, good position.  After irrigation, tourniquet was deflated; 2-0 on the subcutaneous tissue, skin staple closure.  Due to the tightness of the skin, subcuticular closure could not be used.  Postop dressing, hand dressing was applied, volar splint; and transferred to the recovery room.  The patient did have a preoperative block and had good anesthesia postoperatively.     Newman Waren C. Ophelia CharterYates, M.D.     MCY/MEDQ  D:  12/03/2014  T:  12/04/2014  Job:  [id number]

## 2014-12-16 ENCOUNTER — Ambulatory Visit (INDEPENDENT_AMBULATORY_CARE_PROVIDER_SITE_OTHER): Payer: Federal, State, Local not specified - PPO | Admitting: Family Medicine

## 2014-12-16 VITALS — BP 131/80 | Temp 96.1°F | Resp 100 | Ht 69.0 in | Wt 180.0 lb

## 2014-12-16 DIAGNOSIS — N390 Urinary tract infection, site not specified: Secondary | ICD-10-CM

## 2014-12-16 LAB — POCT UA - MICROSCOPIC ONLY
Casts, Ur, LPF, POC: NEGATIVE
Crystals, Ur, HPF, POC: NEGATIVE
Mucus, UA: NEGATIVE
Yeast, UA: NEGATIVE

## 2014-12-16 LAB — POCT URINALYSIS DIPSTICK
Bilirubin, UA: NEGATIVE
Glucose, UA: NEGATIVE
Ketones, UA: NEGATIVE
Leukocytes, UA: NEGATIVE
Nitrite, UA: NEGATIVE
Protein, UA: NEGATIVE
Spec Grav, UA: >=1.03
Urobilinogen, UA: NEGATIVE
pH, UA: 6

## 2014-12-16 MED ORDER — CIPROFLOXACIN HCL 500 MG PO TABS: 500.0000 mg | ORAL_TABLET | Freq: Two times a day (BID) | ORAL | Status: DC

## 2014-12-16 NOTE — Progress Notes (Signed)
   Subjective:    Patient ID: Whitney Peters, female    DOB: January 20, 1961, 54 y.o.   MRN: [id number]  HPI Patient is here for c/o dysuria.  Review of Systems  Constitutional: Negative for fever.  HENT: Negative for ear pain.   Eyes: Negative for discharge.  Respiratory: Negative for cough.   Cardiovascular: Negative for chest pain.  Gastrointestinal: Negative for abdominal distention.  Endocrine: Negative for polyuria.  Genitourinary: Negative for difficulty urinating.  Musculoskeletal: Negative for gait problem and neck pain.  Skin: Negative for color change and rash.  Neurological: Negative for speech difficulty and headaches.  Psychiatric/Behavioral: Negative for agitation.       Objective:    BP 131/80 mmHg  Temp(Src) 96.1 F (35.6 C) (Oral)  Resp 100  Ht 5\' 9"  (1.753 m)  Wt 180 lb (81.647 kg)  BMI 26.57 kg/m2 Physical Exam  Constitutional: She is oriented to person, place, and time. She appears well-developed and well-nourished.  HENT:  Head: Normocephalic and atraumatic.  Mouth/Throat: Oropharynx is clear and moist.  Eyes: Pupils are equal, round, and reactive to light.  Neck: Normal range of motion. Neck supple.  Cardiovascular: Normal rate and regular rhythm.   No murmur heard. Pulmonary/Chest: Effort normal and breath sounds normal.  Abdominal: Soft. Bowel sounds are normal. There is no tenderness.  Neurological: She is alert and oriented to person, place, and time.  Skin: Skin is warm and dry.  Psychiatric: She has a normal mood and affect.    Results for orders placed or performed in visit on 12/16/14  POCT urinalysis dipstick  Result Value Ref Range   Color, UA gold    Clarity, UA clear    Glucose, UA negative    Bilirubin, UA negative    Ketones, UA negative    Spec Grav, UA >=1.030    Blood, UA small    pH, UA 6.0    Protein, UA negative    Urobilinogen, UA negative    Nitrite, UA negative    Leukocytes, UA Negative   POCT UA - Microscopic  Only  Result Value Ref Range   WBC, Ur, HPF, POC 1-5    RBC, urine, microscopic occ    Bacteria, U Microscopic many    Mucus, UA neg    Epithelial cells, urine per micros occ    Crystals, Ur, HPF, POC neg    Casts, Ur, LPF, POC neg    Yeast, UA neg         Assessment & Plan:     ICD-9-CM ICD-10-CM   1. Urinary tract infection without hematuria, site unspecified 599.0 N39.0 POCT urinalysis dipstick     POCT UA - Microscopic Only     Urine culture     ciprofloxacin (CIPRO) 500 MG tablet     No Follow-up on file.  Deatra CanterWilliam J Oxford FNP

## 2014-12-18 LAB — URINE CULTURE: Organism ID, Bacteria: NO GROWTH

## 2014-12-24 ENCOUNTER — Other Ambulatory Visit: Payer: Federal, State, Local not specified - PPO

## 2014-12-29 NOTE — Discharge Summary (Signed)
Patient ID: Whitney LahLoretta Thetford MRN: [id number] DOB/AGE: 1961/08/07 54 y.o.  Admit date: 12/02/2014 Discharge date: 12/29/2014  Admission Diagnoses:  Active Problems:   Wrist fracture   Discharge Diagnoses:  Active Problems:   Wrist fracture  status post Procedure(s): OPEN REDUCTION INTERNAL FIXATION (ORIF) DISTAL RADIAL FRACTURE  Past Medical History  Diagnosis Date  . Anticoagulated on warfarin   . GERD (gastroesophageal reflux disease)   . DVT (deep venous thrombosis)     left leg  . PE (pulmonary embolism) 04/2014  . Asthma     no problems in years  . Uses hearing aid     Surgeries: Procedure(s): OPEN REDUCTION INTERNAL FIXATION (ORIF) DISTAL RADIAL FRACTURE on 12/02/2014   Consultants:    Discharged Condition: Improved  Hospital Course: Whitney Peters is an 54 y.o. female who was admitted 12/02/2014 for operative treatment of distal radius fracture.  Patient failed conservative treatments (please see the history and physical for the specifics) and had severe unremitting pain that affects sleep, daily activities and work/hobbies. After pre-op clearance, the patient was taken to the operating room on 12/02/2014 and underwent  Procedure(s): OPEN REDUCTION INTERNAL FIXATION (ORIF) DISTAL RADIAL FRACTURE.    Patient was given perioperative antibiotics:  Anti-infectives    Start     Dose/Rate Route Frequency Ordered Stop   12/02/14 1415  ceFAZolin (ANCEF) IVPB 2 g/50 mL premix     2 g 100 mL/hr over 30 Minutes Intravenous On call to O.R. 12/02/14 1358 12/02/14 1830       Patient was given sequential compression devices and early ambulation to prevent DVT.   Patient benefited maximally from hospital stay and there were no complications. At the time of discharge, the patient was urinating/moving their bowels without difficulty, tolerating a regular diet, pain is controlled with oral pain medications and they have been cleared by PT/OT.   Recent vital signs: No data  found.    Recent laboratory studies: No results for input(s): WBC, HGB, HCT, PLT, NA, K, CL, CO2, BUN, CREATININE, GLUCOSE, INR, CALCIUM in the last 72 hours.  Invalid input(s): PT, 2   Discharge Medications:     Medication List    STOP taking these medications        acetaminophen 500 MG tablet  Commonly known as:  TYLENOL     apixaban 5 MG Tabs tablet  Commonly known as:  ELIQUIS     benzonatate 200 MG capsule  Commonly known as:  TESSALON     Naproxen Sodium 220 MG Caps     PERCOCET 5-325 MG per tablet  Generic drug:  oxyCODONE-acetaminophen  Replaced by:  oxyCODONE-acetaminophen 10-325 MG per tablet      TAKE these medications        albuterol 108 (90 BASE) MCG/ACT inhaler  Commonly known as:  PROVENTIL HFA;VENTOLIN HFA  Inhale 2 puffs into the lungs every 6 (six) hours as needed for wheezing or shortness of breath.     ondansetron 4 MG disintegrating tablet  Commonly known as:  ZOFRAN ODT  Take 1 tablet (4 mg total) by mouth every 8 (eight) hours as needed.     oxyCODONE-acetaminophen 10-325 MG per tablet  Commonly known as:  PERCOCET  Take 1 tablet by mouth every 6 (six) hours as needed.     ranitidine 150 MG tablet  Commonly known as:  ZANTAC  Take 150 mg by mouth 2 (two) times daily.        Diagnostic Studies: Dg Chest 2 View  12/02/2014   CLINICAL DATA:  Preop ORIF right wrist  EXAM: CHEST  2 VIEW  COMPARISON:  None.  FINDINGS: There is mild bilateral interstitial thickening. There is no focal parenchymal opacity, pleural effusion, or pneumothorax. The heart and mediastinal contours are unremarkable.  The osseous structures are unremarkable.  IMPRESSION: No active cardiopulmonary disease.   Electronically Signed   By: Elige Ko   On: 12/02/2014 15:05   Dg Wrist Complete Right  12/01/2014   CLINICAL DATA:  Acute right wrist pain after slipping on ice in parking lot today. Initial encounter.  EXAM: RIGHT WRIST - COMPLETE 3+ VIEW  COMPARISON:  None.   FINDINGS: Severely comminuted and displaced fracture is seen involving the distal right radius, with posterior displacement of the distal fracture fragments. This appears to be closed and posttraumatic.  IMPRESSION: Severely comminuted and posteriorly displaced distal right radial fracture.   Electronically Signed   By: Roque Lias M.D.   On: 12/01/2014 09:27        Discharge Instructions    Call MD / Call 911    Complete by:  As directed   If you experience chest pain or shortness of breath, CALL 911 and be transported to the hospital emergency room.  If you develope a fever above 101 F, pus (white drainage) or increased drainage or redness at the wound, or calf pain, call your surgeon's office.     Constipation Prevention    Complete by:  As directed   Drink plenty of fluids.  Prune juice may be helpful.  You may use a stool softener, such as Colace (over the counter) 100 mg twice a day.  Use MiraLax (over the counter) for constipation as needed.     Diet - low sodium heart healthy    Complete by:  As directed      Discharge instructions    Complete by:  As directed   Do not remove splint/dressing or get wet!  Wear sling.  Elevate right hand above heart level as much as possible.  No using right hand.     Driving restrictions    Complete by:  As directed   No driving until further notice.     Increase activity slowly as tolerated    Complete by:  As directed      Lifting restrictions    Complete by:  As directed   No lifting until further notice.           Follow-up Information    Follow up with Eldred Manges, MD.   Specialty:  Orthopedic Surgery   Why:  need return office visit 1 week.    Contact information:   [address] [place] [address] [phone]       Discharge Plan:  discharge to   Disposition:     Signed: Naida Sleight  12/29/2014, 2:59 PM

## 2015-06-01 ENCOUNTER — Telehealth: Payer: Self-pay | Admitting: Nurse Practitioner

## 2015-06-01 NOTE — Telephone Encounter (Signed)
Whitney Peters is printing for her and will call.

## 2015-06-15 ENCOUNTER — Encounter: Payer: Self-pay | Admitting: Gastroenterology

## 2015-08-10 ENCOUNTER — Encounter: Payer: Self-pay | Admitting: Gastroenterology

## 2015-08-10 ENCOUNTER — Ambulatory Visit (INDEPENDENT_AMBULATORY_CARE_PROVIDER_SITE_OTHER): Payer: Federal, State, Local not specified - PPO | Admitting: Gastroenterology

## 2015-08-10 VITALS — BP 124/80 | HR 76 | Ht 69.0 in | Wt 180.4 lb

## 2015-08-10 DIAGNOSIS — R1310 Dysphagia, unspecified: Secondary | ICD-10-CM | POA: Diagnosis not present

## 2015-08-10 DIAGNOSIS — K219 Gastro-esophageal reflux disease without esophagitis: Secondary | ICD-10-CM

## 2015-08-10 MED ORDER — PANTOPRAZOLE SODIUM 40 MG PO TBEC: 40.0000 mg | DELAYED_RELEASE_TABLET | Freq: Every day | ORAL | Status: DC

## 2015-08-10 NOTE — Progress Notes (Signed)
HPI :  54 y/o female seen in consultation for dysphagia evaluation from Nils Pyle FNP. She reports this has been ongoing since this past June. She reports with each meal she has dysphagia to solids, drinks fluids to help push food down. She reports with most meals she has to regurgitate some of her food or vomit it up as it has a hard time passing. She has to chew carefully to minimize symptoms. She thinks she has lost a few lbs due to symptoms. She denies odynophagia. She has heartburn which bothers her. She takes zantac BID, and her symptoms persist despite this. She reports she previously was on omeprazole which caused her stomach to "bubble" and cause distension but did not experience pain with this. No liquid dysphagia.  She reports having an EGD in 2014 and again in 2015 for dysphagia. 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 She reports this was done in [place]. She reports she has otherwise had history of blood clots (DVT/PE), occurred in 2014 and again in 2015. She is not sure why. She reports she is not on a blood thinner at this time and does not need lifelong following an evaluation by hematology.   She reports she has had a colonoscopy in 2013 or so, she thinks it was normal. No records of this available. No FH of CRC or esophageal cancer.    Past Medical History  Diagnosis Date  . Anticoagulated on warfarin   . GERD (gastroesophageal reflux disease)   . DVT (deep venous thrombosis) (HCC)     left leg  . PE (pulmonary embolism) 04/2014  . Asthma     no problems in years  . Uses hearing aid      Past Surgical History  Procedure Laterality Date  . Foot surgery Bilateral     BONE SPURS  . Knee surgery Right   . Appendectomy    . Abdominal hysterectomy    . Open reduction internal fixation (orif) distal radial fracture Right 12/02/2014    Procedure: OPEN REDUCTION  INTERNAL FIXATION (ORIF) DISTAL RADIAL FRACTURE;  Surgeon: Eldred Manges, MD;  Location: [facility] OR;  Service: Orthopedics;  Laterality: Right;   Family History  Problem Relation Age of Onset  . Heart disease Mother   . Heart disease Father     PACEMAKER  . Breast cancer Sister     BREAST  . Breast cancer Maternal Grandmother   . Diabetes Maternal Grandmother    Social History  Substance Use Topics  . Smoking status: Former Smoker    Quit date: 11/07/1991  . Smokeless tobacco: Never Used  . Alcohol Use: No   Current Outpatient Prescriptions  Medication Sig Dispense Refill  . valACYclovir (VALTREX) 1000 MG tablet Take 1,000 mg by mouth 2 (two) times daily.    Marland Kitchen albuterol (PROVENTIL HFA;VENTOLIN HFA) 108 (90 BASE) MCG/ACT inhaler Inhale 2 puffs into the lungs every 6 (six) hours as needed for wheezing or shortness of breath.    . ranitidine (ZANTAC) 150 MG tablet Take 150 mg by mouth 2 (two) times daily.     No current facility-administered medications for this visit.   Allergies  Allergen Reactions  . Xarelto [Rivaroxaban] Other (See Comments)    nausea  . Pradaxa [Dabigatran Etexilate Mesylate] Nausea Only     Review of Systems: All systems reviewed and negative except where noted in HPI.  Labs reviewed in epic - CBC, CMP normal earlier this year  Physical Exam: BP 124/80 mmHg  Pulse 76  Ht  (1.753 m)  Wt 180 lb 6 oz (81.818 kg)  BMI 26.62 kg/m2 Constitutional: Pleasant,well-developed, female in no acute distress. HEENT: Normocephalic and atraumatic. Conjunctivae are normal. No scleral icterus. Neck supple.  Cardiovascular: Normal rate, regular rhythm.  Pulmonary/chest: Effort normal and breath sounds normal. No wheezing, rales or rhonchi. Abdominal: Soft, nondistended, protuberant, nontender. Bowel sounds active throughout. There are no masses palpable. No hepatomegaly. Extremities: no edema Lymphadenopathy: No cervical adenopathy noted. Neurological: Alert  and oriented to person place and time. Skin: Skin is warm and dry. No rashes noted. Psychiatric: Normal mood and affect. Behavior is normal.   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 Recommend an EGD with dilation at this time to evaluate / treat her symptoms. In the interim, while she had some intolerance to prilosec, she is having significant breakthrough despite Zantac will stop this and start protonix  daily, hopefully she tolerates this better.   The indications, risks, and benefits of EGD were explained to the patient in detail. Risks include but are not limited to bleeding, perforation, adverse reaction to medications, and cardiopulmonary compromise. Sequelae include but are not limited to the possibility of surgery, hospitalization, and mortality. The patient verbalized understanding and wished to proceed. All questions answered, referred to scheduler. Further recommendations pending results of the exam.   Whitney Patrick, MD [facility]  CC: Nils Pyle FNP

## 2015-08-10 NOTE — Patient Instructions (Signed)
You have been scheduled for an endoscopy. Please follow written instructions given to you at your visit today. If you use inhalers (even only as needed), please bring them with you on the day of your procedure. Your physician has requested that you go to www.startemmi.com and enter the access code given to you at your visit today. This web site gives a general overview about your procedure. However, you should still follow specific instructions given to you by our office regarding your preparation for the procedure.  We have sent the following medications to your pharmacy for you to pick up at your convenience: Protonix .

## 2015-08-26 ENCOUNTER — Other Ambulatory Visit (HOSPITAL_BASED_OUTPATIENT_CLINIC_OR_DEPARTMENT_OTHER): Payer: Self-pay | Admitting: Family Medicine

## 2015-08-26 ENCOUNTER — Ambulatory Visit (HOSPITAL_BASED_OUTPATIENT_CLINIC_OR_DEPARTMENT_OTHER)
Admission: RE | Admit: 2015-08-26 | Discharge: 2015-08-26 | Disposition: A | Discharge status: Home / Self Care | Payer: Federal, State, Local not specified - PPO | Source: Ambulatory Visit | Attending: Family Medicine | Admitting: Family Medicine

## 2015-08-26 DIAGNOSIS — M7989 Other specified soft tissue disorders: Secondary | ICD-10-CM | POA: Diagnosis not present

## 2015-08-26 DIAGNOSIS — M79605 Pain in left leg: Secondary | ICD-10-CM | POA: Insufficient documentation

## 2015-09-06 ENCOUNTER — Ambulatory Visit (AMBULATORY_SURGERY_CENTER): Payer: Federal, State, Local not specified - PPO | Admitting: Gastroenterology

## 2015-09-06 ENCOUNTER — Encounter: Payer: Self-pay | Admitting: Gastroenterology

## 2015-09-06 VITALS — BP 109/76 | HR 54 | Temp 97.3°F | Resp 19 | Ht 69.0 in | Wt 180.0 lb

## 2015-09-06 DIAGNOSIS — Q394 Esophageal web: Secondary | ICD-10-CM

## 2015-09-06 DIAGNOSIS — R1310 Dysphagia, unspecified: Secondary | ICD-10-CM

## 2015-09-06 MED ORDER — SODIUM CHLORIDE 0.9 % IV SOLN: 500.0000 mL | INTRAVENOUS | Status: DC

## 2015-09-06 NOTE — Patient Instructions (Signed)
YOU HAD AN ENDOSCOPIC PROCEDURE TODAY AT THE [facility]:   Refer to the procedure report that was given to you for any specific questions about what was found during the examination.  If the procedure report does not answer your questions, please call your gastroenterologist to clarify.  If you requested that your care partner not be given the details of your procedure findings, then the procedure report has been included in a sealed envelope for you to review at your convenience later.  YOU SHOULD EXPECT: Some feelings of bloating in the abdomen. Passage of more gas than usual.  Walking can help get rid of the air that was put into your GI tract during the procedure and reduce the bloating.   Please Note:  You might notice some irritation and congestion in your nose or some drainage.  This is from the oxygen used during your procedure.  There is no need for concern and it should clear up in a day or so.  SYMPTOMS TO REPORT IMMEDIATELY:    Following upper endoscopy (EGD)  Vomiting of blood or coffee ground material  New chest pain or pain under the shoulder blades  Painful or persistently difficult swallowing  New shortness of breath  Fever of 100F or higher  Black, tarry-looking stools  For urgent or emergent issues, a gastroenterologist can be reached at any hour by calling [phone].   DIET: Your first meal following the procedure should be a small meal and then it is ok to progress to your normal diet. Heavy or fried foods are harder to digest and may make you feel nauseous or bloated.  Likewise, meals heavy in dairy and vegetables can increase bloating.  Drink plenty of fluids but you should avoid alcoholic beverages for 24 hours.  ACTIVITY:  You should plan to take it easy for the rest of today and you should NOT DRIVE or use heavy machinery until tomorrow (because of the sedation medicines used during the test).    FOLLOW UP: Our staff will call the number listed  on your records the next business day following your procedure to check on you and address any questions or concerns that you may have regarding the information given to you following your procedure. If we do not reach you, we will leave a message.  However, if you are feeling well and you are not experiencing any problems, there is no need to return our call.  We will assume that you have returned to your regular daily activities without incident.  If any biopsies were taken you will be contacted by phone or by letter within the next 1-3 weeks.  Please call us at [phone] if you have not heard about the biopsies in 3 weeks.    SIGNATURES/CONFIDENTIALITY: You and/or your care partner have signed paperwork which will be entered into your electronic medical record.  These signatures attest to the fact that that the information above on your After Visit Summary has been reviewed and is understood.  Full responsibility of the confidentiality of this discharge information lies with you and/or your care-partner.  Read all of the handouts given to you by your recovery room nurse.

## 2015-09-06 NOTE — Op Note (Signed)
[facility] [address] [place], [id number]   ENDOSCOPY PROCEDURE REPORT  PATIENT: Whitney Peters, Whitney Peters  MR#: [id number] BIRTHDATE: 1960-12-11 , 54  yrs. old GENDER: female ENDOSCOPIST: Benancio DeedsSteven P Talma Aguillard, MD REFERRED BY: PROCEDURE DATE:  09/06/2015 PROCEDURE:  EGD w/ biopsy ASA CLASS:     Class II INDICATIONS:  dysphagia. MEDICATIONS: Propofol 240 mg IV TOPICAL ANESTHETIC:  DESCRIPTION OF PROCEDURE: After the risks benefits and alternatives of the procedure were thoroughly explained, informed consent was obtained.  The LB [id number] endoscope was introduced through the mouth and advanced to the second portion of the duodenum , Without limitations.  The instrument was slowly withdrawn as the mucosa was fully examined.  FINDINGS:The proximal and mid esophagus were normal.  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  Using the endoscope itself a small wrent was noted.  Given no 10mm diameter or smaller dilator or balloon was available at this time, no further dilation was performed.  Biopsies were taken of the stricture to ensure benign. DH, GEJ, and SCJ located 38cm from the incisors. The stomach was normal in appearance.  The duodenal bulb and 2nd portion of the duodenum were normal otherwise.  Retroflexed views revealed no abnormalities.     The scope was then withdrawn from the patient and the procedure completed.  COMPLICATIONS: There were no immediate complications.  ENDOSCOPIC IMPRESSION: The proximal and mid esophagus were normal. 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  Using the endoscope itself a small wrent was noted.  Given no 10mm diameter or smaller dilator or balloon was available at this time, no further dilation was performed. Biopsies were taken of the stricture to ensure benign. Normal stomach. Normal duodenum  RECOMMENDATIONS: Await  pathology results Resume diet and medications We will order a 10mm balloon and repeat EGD with dilation if symptoms persist given smaller dilator not available today   eSigned:  Benancio DeedsSteven P Aum Caggiano, MD 09/06/2015 10:16 AM   CC: the patient

## 2015-09-06 NOTE — Progress Notes (Signed)
Transferred to recovery room. A/O x3, pleased with MAC.  VSS.  Report to Suzanne, RN. 

## 2015-09-06 NOTE — Progress Notes (Signed)
Called to room to assist during endoscopic procedure.  Patient ID and intended procedure confirmed with present staff. Received instructions for my participation in the procedure from the performing physician.  

## 2015-09-07 ENCOUNTER — Telehealth: Payer: Self-pay

## 2015-09-07 ENCOUNTER — Encounter: Payer: Self-pay | Admitting: *Deleted

## 2015-09-07 NOTE — Telephone Encounter (Signed)
Left message on answering machine. 

## 2015-09-10 ENCOUNTER — Encounter: Payer: Self-pay | Admitting: Gastroenterology

## 2015-12-15 ENCOUNTER — Telehealth: Payer: Self-pay | Admitting: Gastroenterology

## 2015-12-15 NOTE — Telephone Encounter (Signed)
Yes we can get her an EGD. Can you put her in the first available opening. If it is more than 2 weeks let me know and we can add her at 730 on a day in the next week or 2 to accomodate her.  Please tell her to avoid eating any meat to prevent impaction in the interim. Thanks

## 2015-12-15 NOTE — Telephone Encounter (Signed)
Spoke with patient and she is having problems with food getting stuck again. States she has to throw up some times. She is asking for repeat EGD. Please, advise.

## 2015-12-16 ENCOUNTER — Ambulatory Visit (AMBULATORY_SURGERY_CENTER): Payer: Self-pay

## 2015-12-16 VITALS — Ht 69.0 in | Wt 181.0 lb

## 2015-12-16 DIAGNOSIS — R1310 Dysphagia, unspecified: Secondary | ICD-10-CM

## 2015-12-16 NOTE — Progress Notes (Signed)
No egg or soy allergies Not on home 02 No previous anesthesia complications No diet or weight loss meds 

## 2015-12-16 NOTE — Telephone Encounter (Signed)
Spoke with patient and scheduled on 12/20/15 at 11:30 AM for EGD and pre op on 12/16/15 at 2:30 PM.

## 2015-12-20 ENCOUNTER — Ambulatory Visit (AMBULATORY_SURGERY_CENTER): Payer: Federal, State, Local not specified - PPO | Admitting: Gastroenterology

## 2015-12-20 ENCOUNTER — Encounter: Payer: Self-pay | Admitting: Gastroenterology

## 2015-12-20 VITALS — BP 121/56 | HR 56 | Temp 97.0°F | Resp 15 | Ht 69.0 in | Wt 181.0 lb

## 2015-12-20 DIAGNOSIS — R1310 Dysphagia, unspecified: Secondary | ICD-10-CM

## 2015-12-20 DIAGNOSIS — K222 Esophageal obstruction: Secondary | ICD-10-CM

## 2015-12-20 MED ORDER — SODIUM CHLORIDE 0.9 % IV SOLN: 500.0000 mL | INTRAVENOUS | Status: DC

## 2015-12-20 NOTE — Patient Instructions (Signed)
YOU HAD AN ENDOSCOPIC PROCEDURE TODAY AT THE [facility]:   Refer to the procedure report that was given to you for any specific questions about what was found during the examination.  If the procedure report does not answer your questions, please call your gastroenterologist to clarify.  If you requested that your care partner not be given the details of your procedure findings, then the procedure report has been included in a sealed envelope for you to review at your convenience later.  YOU SHOULD EXPECT: Some feelings of bloating in the abdomen. Passage of more gas than usual.  Walking can help get rid of the air that was put into your GI tract during the procedure and reduce the bloating. If you had a lower endoscopy (such as a colonoscopy or flexible sigmoidoscopy) you may notice spotting of blood in your stool or on the toilet paper. If you underwent a bowel prep for your procedure, you may not have a normal bowel movement for a few days.  Please Note:  You might notice some irritation and congestion in your nose or some drainage.  This is from the oxygen used during your procedure.  There is no need for concern and it should clear up in a day or so.  SYMPTOMS TO REPORT IMMEDIATELY:    Following upper endoscopy (EGD)  Vomiting of blood or coffee ground material  New chest pain or pain under the shoulder blades  Painful or persistently difficult swallowing  New shortness of breath  Fever of 100F or higher  Black, tarry-looking stools  For urgent or emergent issues, a gastroenterologist can be reached at any hour by calling [phone].   DIET: See dilation diet.  Likewise, meals heavy in dairy and vegetables can increase bloating.  Drink plenty of fluids but you should avoid alcoholic beverages for 24 hours.  ACTIVITY:  You should plan to take it easy for the rest of today and you should NOT DRIVE or use heavy machinery until tomorrow (because of the sedation medicines  used during the test).    FOLLOW UP: Our staff will call the number listed on your records the next business day following your procedure to check on you and address any questions or concerns that you may have regarding the information given to you following your procedure. If we do not reach you, we will leave a message.  However, if you are feeling well and you are not experiencing any problems, there is no need to return our call.  We will assume that you have returned to your regular daily activities without incident.  If any biopsies were taken you will be contacted by phone or by letter within the next 1-3 weeks.  Please call us at [phone] if you have not heard about the biopsies in 3 weeks.    SIGNATURES/CONFIDENTIALITY: You and/or your care partner have signed paperwork which will be entered into your electronic medical record.  These signatures attest to the fact that that the information above on your After Visit Summary has been reviewed and is understood.  Full responsibility of the confidentiality of this discharge information lies with you and/or your care-partner.  Dilation Diet-handout given  Soft diet, avoid meat if possible.  Wait biopsy results.

## 2015-12-20 NOTE — Op Note (Signed)
[facility] [address] [place], [address]   ENDOSCOPY PROCEDURE REPORT  PATIENT: Whitney Peters, Whitney Peters  MR#: [id number] BIRTHDATE: 1961/05/31 , 54  yrs. old GENDER: female ENDOSCOPIST: Benancio Deeds, MD REFERRED BY: PROCEDURE DATE:  12/20/2015 PROCEDURE:  EGD w/ balloon dilation and EGD w/ biopsy ASA CLASS:     Class II INDICATIONS:  dysphagia. MEDICATIONS: Propofol 200 mg IV TOPICAL ANESTHETIC:  DESCRIPTION OF PROCEDURE: After the risks benefits and alternatives of the procedure were thoroughly explained, informed consent was obtained.  The LB [id number] endoscope was introduced through the mouth and advanced to the second portion of the duodenum , Without limitations.  The instrument was slowly withdrawn as the mucosa was fully examined.   FINDINGS: The esophagus was normal in appearance.  DH noted 37cm from the incisors with GEJ/SCJ located 35cm from the incisors, with a 2cm hiatal hernia.  There was a tight stricture / Shatzski ring roughly 7mm in diameter at the GEJ.  The endoscope was able to be massed with moderate resistance and a mucosal wrent noted following passage of the endoscope itself.  A TTS balloon was then passed and dilated to 9.70mm and then to 10.77mm with good result, wrent noted with heme.  The stricture was then biopsied in 4 quadrants to help open it further.  The stomach was normal in appearance as was the duodenal bulb and 2nd portion of the duodenum.  Retroflexed views revealed a hiatal hernia.     The scope was then withdrawn from the patient and the procedure completed.  COMPLICATIONS: There were no immediate complications.  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 The stricture was then opened further with biopsies. Normal stomach Normal duodenum  RECOMMENDATIONS: Resume medications Soft diet, avoid meats if possible Await  pathology results Recommend repeat EGD with dilation again in 3 weeks for further dilation as I suspect symptoms will persist to some extent until the stricture is opened to a further diameter eSigned:  Benancio Deeds, MD 12/20/2015 12:30 PM   CC: the patient

## 2015-12-20 NOTE — Progress Notes (Signed)
  [facility] Anesthesia Post-op Note  Patient: Janifer Zwiebel  Procedure(s) Performed: endoscopy with dilatation  Patient Location: [facility] - Recovery Area  Anesthesia Type: Deep Sedation/Propofol  Level of Consciousness: awake, oriented and patient cooperative  Airway and Oxygen Therapy: Patient Spontanous Breathing  Post-op Pain: none  Post-op Assessment:  Post-op Vital signs reviewed, Patient's Cardiovascular Status Stable, Respiratory Function Stable, Patent Airway, No signs of Nausea or vomiting and Pain level controlled  Post-op Vital Signs: Reviewed and stable  Complications: No apparent anesthesia complications  Starlette Thurow E 12:29 PM

## 2015-12-20 NOTE — Progress Notes (Signed)
Called to room to assist during endoscopic procedure.  Patient ID and intended procedure confirmed with present staff. Received instructions for my participation in the procedure from the performing physician.  

## 2015-12-21 ENCOUNTER — Telehealth: Payer: Self-pay | Admitting: *Deleted

## 2015-12-21 NOTE — Telephone Encounter (Signed)
  Follow up Call-  Call back number 12/20/2015 09/06/2015  Post procedure Call Back phone  # [phone]  Permission to leave phone message Yes Yes     Patient questions:  Do you have a fever, pain , or abdominal swelling? No. Pain Score  0 *  Have you tolerated food without any problems? Yes.    Have you been able to return to your normal activities? Yes.    Do you have any questions about your discharge instructions: Diet   No. Medications  No. Follow up visit  No.  Do you have questions or concerns about your Care? No.  Actions: * If pain score is 4 or above: No action needed, pain <4.

## 2016-01-05 ENCOUNTER — Ambulatory Visit (AMBULATORY_SURGERY_CENTER): Payer: Self-pay | Admitting: *Deleted

## 2016-01-05 VITALS — Ht 69.0 in | Wt 182.4 lb

## 2016-01-05 DIAGNOSIS — R1310 Dysphagia, unspecified: Secondary | ICD-10-CM

## 2016-01-05 NOTE — Progress Notes (Signed)
No egg or soy allergy known to patient  No issues with past sedation with any surgeries  or procedures, no intubation problems  No diet pills per patient No home 02 use per patient  No blood thinners per patient - off all blood thinners 1 1/2 years now Pt had an egd 12-20-2015 with SA

## 2016-01-11 ENCOUNTER — Ambulatory Visit (AMBULATORY_SURGERY_CENTER): Payer: Federal, State, Local not specified - PPO | Admitting: Gastroenterology

## 2016-01-11 ENCOUNTER — Encounter: Payer: Self-pay | Admitting: Gastroenterology

## 2016-01-11 VITALS — BP 121/72 | HR 62 | Temp 97.8°F | Resp 16 | Ht 69.0 in | Wt 182.0 lb

## 2016-01-11 DIAGNOSIS — R1310 Dysphagia, unspecified: Secondary | ICD-10-CM

## 2016-01-11 DIAGNOSIS — K222 Esophageal obstruction: Secondary | ICD-10-CM | POA: Diagnosis not present

## 2016-01-11 MED ORDER — SODIUM CHLORIDE 0.9 % IV SOLN: 500.0000 mL | INTRAVENOUS | Status: DC

## 2016-01-11 NOTE — Patient Instructions (Signed)
YOU HAD AN ENDOSCOPIC PROCEDURE TODAY AT THE [facility]:   Refer to the procedure report that was given to you for any specific questions about what was found during the examination.  If the procedure report does not answer your questions, please call your gastroenterologist to clarify.  If you requested that your care partner not be given the details of your procedure findings, then the procedure report has been included in a sealed envelope for you to review at your convenience later.  YOU SHOULD EXPECT: Some feelings of bloating in the abdomen. Passage of more gas than usual.  Walking can help get rid of the air that was put into your GI tract during the procedure and reduce the bloating. If you had a lower endoscopy (such as a colonoscopy or flexible sigmoidoscopy) you may notice spotting of blood in your stool or on the toilet paper. If you underwent a bowel prep for your procedure, you may not have a normal bowel movement for a few days.  Please Note:  You might notice some irritation and congestion in your nose or some drainage.  This is from the oxygen used during your procedure.  There is no need for concern and it should clear up in a day or so.  SYMPTOMS TO REPORT IMMEDIATELY:    Following upper endoscopy (EGD)  Vomiting of blood or coffee ground material  New chest pain or pain under the shoulder blades  Painful or persistently difficult swallowing  New shortness of breath  Fever of 100F or higher  Black, tarry-looking stools  For urgent or emergent issues, a gastroenterologist can be reached at any hour by calling [phone].   DIET: Your first meal following the procedure should be a small meal and then it is ok to progress to your normal diet. Heavy or fried foods are harder to digest and may make you feel nauseous or bloated.  Likewise, meals heavy in dairy and vegetables can increase bloating.  Drink plenty of fluids but you should avoid alcoholic beverages  for 24 hours.  ACTIVITY:  You should plan to take it easy for the rest of today and you should NOT DRIVE or use heavy machinery until tomorrow (because of the sedation medicines used during the test).    FOLLOW UP: Our staff will call the number listed on your records the next business day following your procedure to check on you and address any questions or concerns that you may have regarding the information given to you following your procedure. If we do not reach you, we will leave a message.  However, if you are feeling well and you are not experiencing any problems, there is no need to return our call.  We will assume that you have returned to your regular daily activities without incident.  If any biopsies were taken you will be contacted by phone or by letter within the next 1-3 weeks.  Please call us at [phone] if you have not heard about the biopsies in 3 weeks.    SIGNATURES/CONFIDENTIALITY: You and/or your care partner have signed paperwork which will be entered into your electronic medical record.  These signatures attest to the fact that that the information above on your After Visit Summary has been reviewed and is understood.  Full responsibility of the confidentiality of this discharge information lies with you and/or your care-partner.  Stricture information given.  After dilation diet information given.  Increase protonix to twice daily.  Repeat EGD in  3 weeks if dysphagia continues.  Will dilate further.

## 2016-01-11 NOTE — Progress Notes (Signed)
Called to room to assist during endoscopic procedure.  Patient ID and intended procedure confirmed with present staff. Received instructions for my participation in the procedure from the performing physician.  

## 2016-01-11 NOTE — Progress Notes (Signed)
Patient awakening,vss,report to rn 

## 2016-01-11 NOTE — Progress Notes (Deleted)
Called to room to assist during endoscopic procedure.  Patient ID and intended procedure confirmed with present staff. Received instructions for my participation in the procedure from the performing physician.  

## 2016-01-11 NOTE — Op Note (Signed)
[facility] [address] [place], [id number]   ENDOSCOPY PROCEDURE REPORT  PATIENT: Whitney Peters, Whitney Peters  MR#: [id number] BIRTHDATE: Dec 10, 1960 , 54  yrs. old GENDER: female ENDOSCOPIST: Benancio DeedsSteven P Armbruster, MD REFERRED BY: PROCEDURE DATE:  01/11/2016 PROCEDURE:  EGD w/ balloon dilation and EGD w/ biopsy ASA CLASS:     Class III INDICATIONS:  dysphagia. MEDICATIONS: Propofol 250 mg IV TOPICAL ANESTHETIC:  DESCRIPTION OF PROCEDURE: After the risks benefits and alternatives of the procedure were thoroughly explained, informed consent was obtained.  The LB [id number] endoscope was introduced through the mouth and advanced to the second portion of the duodenum , Without limitations.  The instrument was slowly withdrawn as the mucosa was fully examined.    FINDINGS: The esophagus was normal in appearance.  The [facility] was noted 38cm from the incisors, with GEJ and SCJ located 36-37cm from the incisors, with a 1-2cm hiatal hernia.  There was a tight stricture at the GEJ which was < 1cm in length.  There was mild resistance with traversing it with the endoscope itself which appeared to cause a mucosal wrent.  The stomach was normal in appearance.  The duodenal bulb and 2nd portion of the duodenum were normal.  A TTS balloon was placed through the stricture and dilated to 11mm, 12mm, and then 13mm with a moderate mucosal wrent noted.  The stricture was then biopsied in all quadrants to open it up further. Retroflexed views revealed a hiatal hernia.     The scope was then withdrawn from the patient and the procedure completed.  COMPLICATIONS: There were no immediate complications.  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 I hope with the next dilation we can reach a diameter that will more significantly help relieve symptoms of dysphagia   eSigned:  Benancio DeedsSteven P Armbruster, MD 01/11/2016 3:56 PM    CC: the patient  PATIENT NAME:  Whitney Peters, Whitney Peters MR#: [id number]

## 2016-01-12 ENCOUNTER — Telehealth: Payer: Self-pay | Admitting: Emergency Medicine

## 2016-01-12 NOTE — Telephone Encounter (Signed)
Left message, identifier present, f/u 

## 2016-02-24 DIAGNOSIS — R5383 Other fatigue: Secondary | ICD-10-CM | POA: Diagnosis not present

## 2016-02-24 DIAGNOSIS — Z0000 Encounter for general adult medical examination without abnormal findings: Secondary | ICD-10-CM | POA: Diagnosis not present

## 2016-02-24 DIAGNOSIS — Z1231 Encounter for screening mammogram for malignant neoplasm of breast: Secondary | ICD-10-CM | POA: Diagnosis not present

## 2016-02-24 DIAGNOSIS — K219 Gastro-esophageal reflux disease without esophagitis: Secondary | ICD-10-CM | POA: Diagnosis not present

## 2016-02-25 ENCOUNTER — Other Ambulatory Visit (HOSPITAL_BASED_OUTPATIENT_CLINIC_OR_DEPARTMENT_OTHER): Payer: Self-pay | Admitting: Family Medicine

## 2016-02-25 DIAGNOSIS — Z1231 Encounter for screening mammogram for malignant neoplasm of breast: Secondary | ICD-10-CM

## 2016-03-02 ENCOUNTER — Ambulatory Visit (HOSPITAL_BASED_OUTPATIENT_CLINIC_OR_DEPARTMENT_OTHER)
Admission: RE | Admit: 2016-03-02 | Discharge: 2016-03-02 | Disposition: A | Discharge status: Home / Self Care | Payer: Federal, State, Local not specified - PPO | Source: Ambulatory Visit | Attending: Family Medicine | Admitting: Family Medicine

## 2016-03-02 DIAGNOSIS — Z1231 Encounter for screening mammogram for malignant neoplasm of breast: Secondary | ICD-10-CM | POA: Diagnosis not present

## 2016-03-28 ENCOUNTER — Other Ambulatory Visit (HOSPITAL_BASED_OUTPATIENT_CLINIC_OR_DEPARTMENT_OTHER): Payer: Self-pay | Admitting: Family Medicine

## 2016-03-28 ENCOUNTER — Ambulatory Visit (HOSPITAL_BASED_OUTPATIENT_CLINIC_OR_DEPARTMENT_OTHER)
Admission: RE | Admit: 2016-03-28 | Discharge: 2016-03-28 | Disposition: A | Discharge status: Home / Self Care | Payer: Federal, State, Local not specified - PPO | Source: Ambulatory Visit | Attending: Family Medicine | Admitting: Family Medicine

## 2016-03-28 DIAGNOSIS — Z86718 Personal history of other venous thrombosis and embolism: Secondary | ICD-10-CM | POA: Diagnosis not present

## 2016-03-28 DIAGNOSIS — M79605 Pain in left leg: Secondary | ICD-10-CM

## 2016-03-28 DIAGNOSIS — R600 Localized edema: Secondary | ICD-10-CM | POA: Diagnosis not present

## 2016-03-28 DIAGNOSIS — R1030 Lower abdominal pain, unspecified: Secondary | ICD-10-CM | POA: Diagnosis not present

## 2016-06-20 ENCOUNTER — Telehealth: Payer: Self-pay | Admitting: Gastroenterology

## 2016-06-20 NOTE — Telephone Encounter (Signed)
Patient with dyphagia again to solids.  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  I have tentatively scheduled her for a repeat EGD/ pre-visit  with dilation in the [facility] for 06/30/16.  (Per last EGD report could have a repeat dilation in 3 weeks if symptoms persisted.) She understands that Dr. Adela LankArmbruster is out of the office this week and he may want to change the plans once he has a chance to review.    She is advised to avoid solid foods especially meats and breads until dilation.

## 2016-06-23 NOTE — Telephone Encounter (Signed)
Thanks very much, agree with EGD and dilation. Thanks for booking and will await this exam.

## 2016-06-26 ENCOUNTER — Ambulatory Visit: Payer: Federal, State, Local not specified - PPO | Admitting: *Deleted

## 2016-06-26 VITALS — Ht 69.0 in | Wt 187.2 lb

## 2016-06-26 DIAGNOSIS — K222 Esophageal obstruction: Secondary | ICD-10-CM

## 2016-06-26 NOTE — Progress Notes (Signed)
Patient denies any allergies to egg or soy products. Patient denies complications with anesthesia/sedation.  Patient denies oxygen use at home and denies diet medications. Emmi instructions for endoscopy explained but patient refused.   

## 2016-06-30 ENCOUNTER — Ambulatory Visit (AMBULATORY_SURGERY_CENTER): Payer: Federal, State, Local not specified - PPO | Admitting: Gastroenterology

## 2016-06-30 ENCOUNTER — Encounter: Payer: Self-pay | Admitting: Gastroenterology

## 2016-06-30 VITALS — BP 118/67 | HR 72 | Temp 98.6°F | Resp 18 | Ht 69.0 in | Wt 187.0 lb

## 2016-06-30 DIAGNOSIS — K222 Esophageal obstruction: Secondary | ICD-10-CM | POA: Diagnosis not present

## 2016-06-30 DIAGNOSIS — R1310 Dysphagia, unspecified: Secondary | ICD-10-CM | POA: Diagnosis present

## 2016-06-30 MED ORDER — OMEPRAZOLE 40 MG PO CPDR: 40.0000 mg | DELAYED_RELEASE_CAPSULE | Freq: Every day | ORAL | 3 refills | Status: DC

## 2016-06-30 MED ORDER — SODIUM CHLORIDE 0.9 % IV SOLN: 500.0000 mL | INTRAVENOUS | Status: DC

## 2016-06-30 NOTE — Progress Notes (Signed)
Teeth unchanged after procedure.A and O x3. Report to RN. Tolerated MAC anesthesia well. 

## 2016-06-30 NOTE — Patient Instructions (Addendum)
YOU HAD AN ENDOSCOPIC PROCEDURE TODAY AT THE [facility]:   Refer to the procedure report that was given to you for any specific questions about what was found during the examination.  If the procedure report does not answer your questions, please call your gastroenterologist to clarify.  If you requested that your care partner not be given the details of your procedure findings, then the procedure report has been included in a sealed envelope for you to review at your convenience later.  YOU SHOULD EXPECT: Some feelings of bloating in the abdomen. Passage of more gas than usual.  Walking can help get rid of the air that was put into your GI tract during the procedure and reduce the bloating. If you had a lower endoscopy (such as a colonoscopy or flexible sigmoidoscopy) you may notice spotting of blood in your stool or on the toilet paper. If you underwent a bowel prep for your procedure, you may not have a normal bowel movement for a few days.  Please Note:  You might notice some irritation and congestion in your nose or some drainage.  This is from the oxygen used during your procedure.  There is no need for concern and it should clear up in a day or so.  SYMPTOMS TO REPORT IMMEDIATELY:    Following upper endoscopy (EGD)  Vomiting of blood or coffee ground material  New chest pain or pain under the shoulder blades  Painful or persistently difficult swallowing  New shortness of breath  Fever of 100F or higher  Black, tarry-looking stools  For urgent or emergent issues, a gastroenterologist can be reached at any hour by calling [phone].   DIET:  Clear liquids until 1:20pm today.  Then a soft diet for the rest of today.  Resume a regular diet tomorrow.  Drink plenty of fluids but you should avoid alcoholic beverages for 24 hours.  ACTIVITY:  You should plan to take it easy for the rest of today and you should NOT DRIVE or use heavy machinery until tomorrow (because of the  sedation medicines used during the test).    FOLLOW UP: Our staff will call the number listed on your records the next business day following your procedure to check on you and address any questions or concerns that you may have regarding the information given to you following your procedure. If we do not reach you, we will leave a message.  However, if you are feeling well and you are not experiencing any problems, there is no need to return our call.  We will assume that you have returned to your regular daily activities without incident.  If any biopsies were taken you will be contacted by phone or by letter within the next 1-3 weeks.  Please call us at [phone] if you have not heard about the biopsies in 3 weeks.    SIGNATURES/CONFIDENTIALITY: You and/or your care partner have signed paperwork which will be entered into your electronic medical record.  These signatures attest to the fact that that the information above on your After Visit Summary has been reviewed and is understood.  Full responsibility of the confidentiality of this discharge information lies with you and/or your care-partner.  Read all of the handouts given to you by your recovery room nurse.

## 2016-06-30 NOTE — Progress Notes (Signed)
Called to room to assist during endoscopic procedure.  Patient ID and intended procedure confirmed with present staff. Received instructions for my participation in the procedure from the performing physician.  

## 2016-06-30 NOTE — Op Note (Signed)
[facility] Patient Name: Whitney Peters Procedure Date: 06/30/2016 10:53 AM MRN: [id number] Endoscopist: Viviann Spare P. Adela Lank , MD Age: 55 Referring MD:  Date of Birth: 04-Feb-1961 Gender: Female Account #: [id number] Procedure:                Upper GI endoscopy Indications:              Dysphagia, history of esophageal stricture Medicines:                Monitored Anesthesia Care Procedure:                Pre-Anesthesia Assessment:                           - Prior to the procedure, a History and Physical                            was performed, and patient medications and                            allergies were reviewed. The patient's tolerance of                            previous anesthesia was also reviewed. The risks                            and benefits of the procedure and the sedation                            options and risks were discussed with the patient.                            All questions were answered, and informed consent                            was obtained. Prior Anticoagulants: The patient has                            taken no previous anticoagulant or antiplatelet                            agents. ASA Grade Assessment: III - A patient with                            severe systemic disease. After reviewing the risks                            and benefits, the patient was deemed in                            satisfactory condition to undergo the procedure.                           After obtaining informed consent, the endoscope was  passed under direct vision. Throughout the                            procedure, the patient's blood pressure, pulse, and                            oxygen saturations were monitored continuously. The                            Model GIF-HQ190 [id number]) scope was introduced                            through the mouth, and advanced to the second part                             of duodenum. The upper GI endoscopy was                            accomplished without difficulty. The patient                            tolerated the procedure well. Scope In: Scope Out: Findings:                 Esophagogastric landmarks were identified: the                            Z-line was found at 38 cm, the gastroesophageal                            junction was found at 38 cm and the upper extent of                            the gastric folds was found at 40 cm from the                            incisors.                           A 2 cm hiatal hernia was present.                           One moderate (circumferential scarring or stenosis;                            an endoscope may pass) benign-appearing, intrinsic                            stenosis was found. This measured less than one cm                            (in length) and was traversed with the endoscope,  although a small wrent was noted and the endoscope                            itself dilated the stricture. A TTS dilator was                            then passed through the scope. Dilation with an                            09-17-12 mm balloon dilator was performed to 13 mm                            with a moderate mucosal wrent.                           The exam of the esophagus was otherwise normal.                           The entire examined stomach was normal.                           The duodenal bulb and second portion of the                            duodenum were normal. Complications:            No immediate complications. Estimated blood loss:                            Minimal. Estimated Blood Loss:     Estimated blood loss was minimal. Impression:               - Esophagogastric landmarks identified.                           - 2 cm hiatal hernia.                           - Benign-appearing esophageal stenosis. Dilated to                             13mm.                           - Normal stomach.                           - Normal duodenal bulb and second portion of the                            duodenum. Recommendation:           - Patient has a contact number available for                            emergencies. The signs and symptoms of potential  delayed complications were discussed with the                            patient. Return to normal activities tomorrow.                            Written discharge instructions were provided to the                            patient.                           - Resume previous diet.                           - Continue present medications.                           - Start pantoprazole 40mg  daily (previously                            intolerant to omeprazole)                           - No aspirin, ibuprofen, naproxen, or other                            non-steroidal anti-inflammatory drugs for 2 weeks.                           - Repeat upper endoscopy in 3 weeks for retreatment                            to dilate to a larger diameter given recurrence of                            symptoms. Viviann Spare P. Armbruster, MD 06/30/2016 11:13:03 AM This report has been signed electronically.

## 2016-06-30 NOTE — Progress Notes (Signed)
Dr. Adela LankArmbruster is aware that EGD will be on 07-26-16

## 2016-07-03 ENCOUNTER — Telehealth: Payer: Self-pay

## 2016-07-03 NOTE — Telephone Encounter (Signed)
  Follow up Call-  Call back number 06/30/2016 01/11/2016 12/20/2015 09/06/2015  Post procedure Call Back phone  # [phone]  Permission to leave phone message Yes Yes Yes Yes  Some recent data might be hidden    Patient was called for follow up after her procedure on 06/30/2016. No answer at the number given for follow up phone call. A  message was left on the answering machine.

## 2016-07-13 ENCOUNTER — Ambulatory Visit (AMBULATORY_SURGERY_CENTER): Payer: Self-pay | Admitting: *Deleted

## 2016-07-13 DIAGNOSIS — K222 Esophageal obstruction: Secondary | ICD-10-CM

## 2016-07-13 NOTE — Progress Notes (Signed)
Denies allergies to eggs or soy products. Denies complications with sedation or anesthesia. Denies O2 use. Denies use of diet or weight loss medications.  Emmi instructions given for endoscopy.  

## 2016-07-26 ENCOUNTER — Ambulatory Visit (AMBULATORY_SURGERY_CENTER): Payer: Federal, State, Local not specified - PPO | Admitting: Gastroenterology

## 2016-07-26 ENCOUNTER — Encounter: Payer: Self-pay | Admitting: Gastroenterology

## 2016-07-26 VITALS — BP 119/62 | HR 60 | Temp 98.7°F | Resp 17 | Ht 69.0 in | Wt 190.0 lb

## 2016-07-26 DIAGNOSIS — K219 Gastro-esophageal reflux disease without esophagitis: Secondary | ICD-10-CM | POA: Diagnosis not present

## 2016-07-26 DIAGNOSIS — R1310 Dysphagia, unspecified: Secondary | ICD-10-CM

## 2016-07-26 DIAGNOSIS — K222 Esophageal obstruction: Secondary | ICD-10-CM | POA: Diagnosis present

## 2016-07-26 MED ORDER — PANTOPRAZOLE SODIUM 40 MG PO TBEC: 40.0000 mg | DELAYED_RELEASE_TABLET | Freq: Every day | ORAL | 3 refills | Status: DC

## 2016-07-26 MED ORDER — SODIUM CHLORIDE 0.9 % IV SOLN: 500.0000 mL | INTRAVENOUS | Status: DC

## 2016-07-26 NOTE — Patient Instructions (Signed)
YOU HAD AN ENDOSCOPIC PROCEDURE TODAY AT THE [facility]:   Refer to the procedure report that was given to you for any specific questions about what was found during the examination.  If the procedure report does not answer your questions, please call your gastroenterologist to clarify.  If you requested that your care partner not be given the details of your procedure findings, then the procedure report has been included in a sealed envelope for you to review at your convenience later.  YOU SHOULD EXPECT: Some feelings of bloating in the abdomen. Passage of more gas than usual.  Walking can help get rid of the air that was put into your GI tract during the procedure and reduce the bloating. If you had a lower endoscopy (such as a colonoscopy or flexible sigmoidoscopy) you may notice spotting of blood in your stool or on the toilet paper. If you underwent a bowel prep for your procedure, you may not have a normal bowel movement for a few days.  Please Note:  You might notice some irritation and congestion in your nose or some drainage.  This is from the oxygen used during your procedure.  There is no need for concern and it should clear up in a day or so.  SYMPTOMS TO REPORT IMMEDIATELY:    Following upper endoscopy (EGD)  Vomiting of blood or coffee ground material  New chest pain or pain under the shoulder blades  Painful or persistently difficult swallowing  New shortness of breath  Fever of 100F or higher  Black, tarry-looking stools  For urgent or emergent issues, a gastroenterologist can be reached at any hour by calling [phone].   DIET:  We do recommend a small meal at first, but then you may proceed to your regular diet.  Drink plenty of fluids but you should avoid alcoholic beverages for 24 hours.  ACTIVITY:  You should plan to take it easy for the rest of today and you should NOT DRIVE or use heavy machinery until tomorrow (because of the sedation medicines used  during the test).    FOLLOW UP: Our staff will call the number listed on your records the next business day following your procedure to check on you and address any questions or concerns that you may have regarding the information given to you following your procedure. If we do not reach you, we will leave a message.  However, if you are feeling well and you are not experiencing any problems, there is no need to return our call.  We will assume that you have returned to your regular daily activities without incident.  If any biopsies were taken you will be contacted by phone or by letter within the next 1-3 weeks.  Please call us at [phone] if you have not heard about the biopsies in 3 weeks.    SIGNATURES/CONFIDENTIALITY: You and/or your care partner have signed paperwork which will be entered into your electronic medical record.  These signatures attest to the fact that that the information above on your After Visit Summary has been reviewed and is understood.  Full responsibility of the confidentiality of this discharge information lies with you and/or your care-partner.   New order for protonix sent to [facility] [place],please pick up, Resume remainder of medications. Per  Doctor Armbruster verbal order resume previous diet. Information given on Hiatal  Hernia.

## 2016-07-26 NOTE — Op Note (Signed)
[facility] Patient Name: Whitney Peters Procedure Date: 07/26/2016 9:51 AM MRN: [id number] Endoscopist: Viviann Spare P. Adela Lank , MD Age: 55 Referring MD:  Date of Birth: [birth year] Gender: Female Account #: [id number] Procedure:                Upper GI endoscopy Indications:              Dysphagia, history of GEJ stricture, previously                            dilated to max 13mm Medicines:                Monitored Anesthesia Care Procedure:                Pre-Anesthesia Assessment:                           - Prior to the procedure, a History and Physical                            was performed, and patient medications and                            allergies were reviewed. The patient's tolerance of                            previous anesthesia was also reviewed. The risks                            and benefits of the procedure and the sedation                            options and risks were discussed with the patient.                            All questions were answered, and informed consent                            was obtained. Prior Anticoagulants: The patient has                            taken no previous anticoagulant or antiplatelet                            agents. ASA Grade Assessment: II - A patient with                            mild systemic disease. After reviewing the risks                            and benefits, the patient was deemed in                            satisfactory condition to undergo the procedure.  After obtaining informed consent, the endoscope was                            passed under direct vision. Throughout the                            procedure, the patient's blood pressure, pulse, and                            oxygen saturations were monitored continuously. The                            Model GIF-HQ190 [id number]) scope was introduced                            through the mouth, and  advanced to the body of the                            stomach. The upper GI endoscopy was accomplished                            without difficulty. The patient tolerated the                            procedure well. Scope In: Scope Out: Findings:                 Esophagogastric landmarks were identified: the                            Z-line was found at 38 cm, the gastroesophageal                            junction was found at 38 cm and the upper extent of                            the gastric folds was found at 40 cm from the                            incisors.                           A 2 cm hiatal hernia was present.                           One moderate (circumferential scarring or stenosis;                            an endoscope may pass) benign-appearing, intrinsic                            stenosis was found at the GEJ This measured less                            than  one cm (in length) and was traversed. A TTS                            dilator was passed through the scope. Dilation with                            a 16 mm balloon dilator was performed to 16 mm with                            a moderate mucosal wrent. No further dilation was                            performed given this finding.                           The exam of the esophagus was otherwise normal.                           The cardia, gastric fundus and gastric body were                            normal. The remainder of the stomach and duodenum                            was not examined given she has had recent multiple                            normal exams of this area. Complications:            No immediate complications. Estimated blood loss:                            Minimal. Estimated Blood Loss:     Estimated blood loss was minimal. Impression:               - Esophagogastric landmarks identified.                           - 2 cm hiatal hernia.                           -  Benign-appearing esophageal stenosis. Dilated to                            16mm.                           - Normal cardia, gastric fundus and gastric body. Recommendation:           - Patient has a contact number available for                            emergencies. The signs and symptoms of potential  delayed complications were discussed with the                            patient. Return to normal activities tomorrow.                            Written discharge instructions were provided to the                            patient.                           - Resume previous diet.                           - Continue present medications (omeprazole or                            protonix)                           - Monitor course for symptomatic improvement.                            Repeat upper endoscopy in 3 weeks for retreatment                            if symptoms persist. Viviann Spare P. Sarkis Rhines, MD 07/26/2016 10:10:08 AM This report has been signed electronically.

## 2016-07-26 NOTE — Progress Notes (Signed)
Report to PACU, RN, vss, BBS= Clear.  

## 2016-07-26 NOTE — Progress Notes (Signed)
Called to room to assist during endoscopic procedure.  Patient ID and intended procedure confirmed with present staff. Received instructions for my participation in the procedure from the performing physician.  

## 2016-07-27 ENCOUNTER — Telehealth: Payer: Self-pay | Admitting: Gastroenterology

## 2016-07-27 ENCOUNTER — Telehealth: Payer: Self-pay

## 2016-07-27 NOTE — Telephone Encounter (Signed)
  Follow up Call-  Call back number 07/26/2016 06/30/2016 01/11/2016 12/20/2015 09/06/2015  Post procedure Call Back phone  # [phone]  Permission to leave phone message Yes Yes Yes Yes Yes  Some recent data might be hidden     Patient questions:  Do you have a fever, pain , or abdominal swelling? No. Pain Score  0 *  Have you tolerated food without any problems? Yes.    Have you been able to return to your normal activities? Yes.    Do you have any questions about your discharge instructions: Diet   No. Medications  No. Follow up visit  No.  Do you have questions or concerns about your Care? No.  Actions: * If pain score is 4 or above: No action needed, pain <4.

## 2016-07-27 NOTE — Telephone Encounter (Signed)
  Follow up Call-  Call back number 07/26/2016 06/30/2016 01/11/2016 12/20/2015 09/06/2015  Post procedure Call Back phone  # [phone]  Permission to leave phone message Yes Yes Yes Yes Yes  Some recent data might be hidden    Patient was called for follow up after her procedure on 07/26/2016. No answer at the number given for follow up phone call. A message was left on the answering machine.

## 2016-07-28 NOTE — Telephone Encounter (Signed)
She does not tolerate omeprazole and we have switched her to protonix 40mg  po q day if you can help process that. thanks

## 2016-07-28 NOTE — Telephone Encounter (Signed)
You gave Omeprazole 40mg  qd on 8/25 and change to Pantoprazole40mg  qd  on 9/20 which requires a Pa. Which med do you prefer?

## 2016-07-31 NOTE — Telephone Encounter (Signed)
Protonix approved through cover my meds. [facility] [place]

## 2016-08-22 DIAGNOSIS — Z23 Encounter for immunization: Secondary | ICD-10-CM | POA: Diagnosis not present

## 2016-08-22 DIAGNOSIS — E785 Hyperlipidemia, unspecified: Secondary | ICD-10-CM | POA: Diagnosis not present

## 2016-08-28 ENCOUNTER — Encounter: Payer: Self-pay | Admitting: Gastroenterology

## 2016-08-28 ENCOUNTER — Ambulatory Visit (INDEPENDENT_AMBULATORY_CARE_PROVIDER_SITE_OTHER): Payer: Federal, State, Local not specified - PPO | Admitting: Gastroenterology

## 2016-08-28 ENCOUNTER — Other Ambulatory Visit (INDEPENDENT_AMBULATORY_CARE_PROVIDER_SITE_OTHER): Payer: Federal, State, Local not specified - PPO

## 2016-08-28 VITALS — BP 122/70 | HR 76 | Ht 69.0 in | Wt 191.0 lb

## 2016-08-28 DIAGNOSIS — R1310 Dysphagia, unspecified: Secondary | ICD-10-CM

## 2016-08-28 DIAGNOSIS — K222 Esophageal obstruction: Secondary | ICD-10-CM

## 2016-08-28 LAB — BASIC METABOLIC PANEL
BUN: 9 mg/dL (ref 6–23)
CALCIUM: 9.4 mg/dL (ref 8.4–10.5)
CO2: 29 meq/L (ref 19–32)
CREATININE: 0.81 mg/dL (ref 0.40–1.20)
Chloride: 105 mEq/L (ref 96–112)
GFR: 77.57 mL/min (ref >60.00)
Glucose, Bld: 90 mg/dL (ref 70–99)
Potassium: 4.1 mEq/L (ref 3.5–5.1)
Sodium: 140 mEq/L (ref 135–145)

## 2016-08-28 NOTE — Patient Instructions (Signed)
If you are age 56 or older, your body mass index should be between 23-30. Your Body mass index is 28.21 kg/m. If this is out of the aforementioned range listed, please consider follow up with your Primary Care Provider.  If you are age 56 or younger, your body mass index should be between 19-25. Your Body mass index is 28.21 kg/m. If this is out of the aformentioned range listed, please consider follow up with your Primary Care Provider.   Your physician has requested that you go to the basement for lab work before leaving today.  You have been scheduled for an endoscopy. Please follow written instructions given to you at your visit today. If you use inhalers (even only as needed), please bring them with you on the day of your procedure. Your physician has requested that you go to [email or web address] and enter the access code given to you at your visit today. This web site gives a general overview about your procedure. However, you should still follow specific instructions given to you by our office regarding your preparation for the procedure.

## 2016-08-28 NOTE — Progress Notes (Signed)
HPI :  55 year old female, with past medical history as outlined below, here for follow-up visit for dysphagia. 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 Her GJ stricture has been roughly 7 mm in diameter when initially diagnosed. After follow-up EGDs her last exam where it was dilated to 16 mm on September 20.  She reports significant benefit since the last dilation. She denies any dysphagia at present time. Previously she would have recurrence of symptoms within 1-2 months or so. She has been taking zantac and not protonix as she was concerned about interactoin with crestor.  Previous biopsies of the stricture abdomen benign without any evidence of Barrett's or EoE.  She reports she has otherwise had history of blood clots (DVT/PE), occurred in 2014 and again in 2015. She is not sure why. She reports she is not on a blood thinner at this time and does not need lifelong following an evaluation by hematology.   She reports she has had a colonoscopy in 2013 or so, she thinks it was normal. No records of this available. No FH of CRC or esophageal cancer.  EGD 07/26/16 - GEJ stricture dilated to 16mm EGD 06/30/16 - GEJ stricture dilated to 13mm EGD 01/11/16 - GEJ stricture dilated to 13mm EGD 12/20/15 - 7mm GEJ stircture, dilated to 10.5mm, biopsies benign, no EoE EGD 09/06/15 - 7-108mm GEJ stricture, dilated with endoscope, biopsies benign  Past Medical History:  Diagnosis Date  . Allergy    seasonal  . Anticoagulated on warfarin    off since 2015  . Asthma    no problems in years, rarely uses inhaler  . DVT (deep venous thrombosis) (HCC)    left leg in 2014  . GERD (gastroesophageal reflux disease)   . Heart murmur    pt denies  . Hyperlipidemia   . PE (pulmonary embolism) 04/2014  . Uses hearing aid    bilateral  . Wears partial dentures    upper dentures and lower partial     Past Surgical History:    Procedure Laterality Date  . ABDOMINAL HYSTERECTOMY    . APPENDECTOMY    . COLONOSCOPY    . ESOPHAGOGASTRODUODENOSCOPY (EGD) WITH ESOPHAGEAL DILATION     x3  . FOOT SURGERY Bilateral    BONE SPURS  . KNEE SURGERY Right   . OPEN REDUCTION INTERNAL FIXATION (ORIF) DISTAL RADIAL FRACTURE Right 12/02/2014   Procedure: OPEN REDUCTION INTERNAL FIXATION (ORIF) DISTAL RADIAL FRACTURE;  Surgeon: Eldred Manges, MD;  Location: [facility] OR;  Service: Orthopedics;  Laterality: Right;  . UPPER GASTROINTESTINAL ENDOSCOPY     Family History  Problem Relation Age of Onset  . Heart disease Mother   . Heart disease Father     PACEMAKER  . Breast cancer Sister   . Breast cancer Maternal Grandmother   . Diabetes Maternal Grandmother   . Esophageal cancer Maternal Uncle   . Colon cancer Neg Hx   . Colon polyps Neg Hx   . Rectal cancer Neg Hx   . Stomach cancer Neg Hx    Social History  Substance Use Topics  . Smoking status: Former Smoker    Packs/day: 20.00    Types: Cigarettes    Quit date: 11/07/1991  . Smokeless tobacco: Never Used  . Alcohol use No   Current Outpatient Prescriptions  Medication Sig Dispense Refill  . ranitidine (ZANTAC) 150 MG tablet Take 150 mg by mouth  daily.    . rosuvastatin (CRESTOR) 5 MG tablet Take 5 mg by mouth daily.     Current Facility-Administered Medications  Medication Dose Route Frequency Provider Last Rate Last Dose  . 0.9 %  sodium chloride infusion  500 mL Intravenous Continuous Ruffin FrederickSteven Paul Kana Reimann, MD       Allergies  Allergen Reactions  . Xarelto [Rivaroxaban] Other (See Comments)    nausea  . Pradaxa [Dabigatran Etexilate Mesylate] Nausea Only     Review of Systems: All systems reviewed and negative except where noted in HPI.    Lab Results  Component Value Date   WBC 9.0 12/02/2014   HGB 13.4 12/02/2014   HCT 40.4 12/02/2014   MCV 91.6 12/02/2014   PLT 281 12/02/2014    Lab Results  Component Value Date   CREATININE 0.89 12/02/2014    BUN 8 12/02/2014   NA 138 12/02/2014   K 3.8 12/02/2014   CL 106 12/02/2014   CO2 26 12/02/2014    Physical Exam: BP 122/70   Pulse 76   Ht 5\' 9"  (1.753 m)   Wt 191 lb (86.6 kg)   BMI 28.21 kg/m  Constitutional: Pleasant,well-developed, female in no acute distress. HEENT: Normocephalic and atraumatic. Conjunctivae are normal. No scleral icterus. Neck supple.  Cardiovascular: Normal rate, regular rhythm.  Pulmonary/chest: Effort normal and breath sounds normal. No wheezing, rales or rhonchi. Abdominal: Soft, nondistended, nontender. There are no masses palpable. No hepatomegaly. Extremities: no edema Lymphadenopathy: No cervical adenopathy noted. Neurological: Alert and oriented to person place and time. Skin: Skin is warm and dry. No rashes noted. Psychiatric: Normal mood and affect. Behavior is normal.   ASSESSMENT AND PLAN: 55 year old female with history of benign peptic stricture of the GEJ as outlined above.  She has required multiple endoscopies with dilation over the past year, given how narrow the stricture was at diagnosis. She is significantly improved since this process started, last dilated up to 16 mm with complete relief of symptoms. I had asked her to take daily Protonix to minimize risk of recurrence which she has not done due to concerns for interaction with Crestor. I counseled her it is safe to take these together and recommend she take Protonix 40 mg once a day for now. Discussed risks and benefits of PPIs, her last kidney function is over a year ago recheck this to ensure normal. I otherwise think another endoscopy in the next month or so may be helpful to stretch her out a little bit further to prevent recurrent symptoms, at which point we can lengthen the intervals which she needs endoscopy. I discussed risks and benefits of endoscopy and dilation with her and she wanted to proceed.    Ileene PatrickSteven Brylee Mcgreal, MD [facility] Pager [phone]

## 2016-08-29 ENCOUNTER — Encounter: Payer: Self-pay | Admitting: Gastroenterology

## 2016-08-29 NOTE — Progress Notes (Signed)
Letter mailed. jf 

## 2016-08-31 ENCOUNTER — Ambulatory Visit (AMBULATORY_SURGERY_CENTER): Payer: Federal, State, Local not specified - PPO | Admitting: Gastroenterology

## 2016-08-31 ENCOUNTER — Encounter: Payer: Self-pay | Admitting: Gastroenterology

## 2016-08-31 VITALS — BP 104/70 | HR 81 | Temp 97.7°F | Resp 12 | Ht 69.0 in | Wt 191.0 lb

## 2016-08-31 DIAGNOSIS — K222 Esophageal obstruction: Secondary | ICD-10-CM | POA: Diagnosis not present

## 2016-08-31 DIAGNOSIS — R1310 Dysphagia, unspecified: Secondary | ICD-10-CM | POA: Diagnosis not present

## 2016-08-31 MED ORDER — SODIUM CHLORIDE 0.9 % IV SOLN: 500.0000 mL | INTRAVENOUS | Status: DC

## 2016-08-31 NOTE — Progress Notes (Signed)
To PACU, vss patent aw report to rn 

## 2016-08-31 NOTE — Op Note (Signed)
[facility] Patient Name: Whitney Peters Procedure Date: 08/31/2016 9:03 AM MRN: [id number] Endoscopist: Viviann Spare P. Armbruster MD, MD Age: 55 Referring MD:  Date of Birth: 10-23-1961 Gender: Female Account #: [id number] Procedure:                Upper GI endoscopy Indications:              Dysphagia due to peptic stricture, stricture                            started at 7mm diameter, significantly improved                            with dilation. Previously biopsied and benign. Last                            dilation to 16mm. Patient not yet started PPI Medicines:                Monitored Anesthesia Care Procedure:                Pre-Anesthesia Assessment:                           - Prior to the procedure, a History and Physical                            was performed, and patient medications and                            allergies were reviewed. The patient's tolerance of                            previous anesthesia was also reviewed. The risks                            and benefits of the procedure and the sedation                            options and risks were discussed with the patient.                            All questions were answered, and informed consent                            was obtained. Prior Anticoagulants: The patient has                            taken no previous anticoagulant or antiplatelet                            agents. ASA Grade Assessment: III - A patient with                            severe systemic disease. After reviewing the risks  and benefits, the patient was deemed in                            satisfactory condition to undergo the procedure.                           After obtaining informed consent, the endoscope was                            passed under direct vision. Throughout the                            procedure, the patient's blood pressure, pulse, and   oxygen saturations were monitored continuously. The                            Model GIF-[id number]) scope was introduced                            through the mouth, and advanced to the body of the                            stomach. The upper GI endoscopy was accomplished                            without difficulty. The patient tolerated the                            procedure well. Scope In: Scope Out: Findings:                 Esophagogastric landmarks were identified: the                            Z-line was found at 37 cm, the gastroesophageal                            junction was found at 37 cm and the upper extent of                            the gastric folds was found at 39 cm from the                            incisors.                           A 2 cm hiatal hernia was present.                           One moderate benign-appearing, intrinsic stenosis                            was found, improved in appearance since the last  visit. This measured less than one cm (in length)                            and was traversed. A TTS dilator was passed through                            the scope. Dilation with a 16-17-18 mm balloon                            dilator was performed to 18 mm, after which                            appropriate mucosal wrents were noted. No further                            dilation was performed.                           The exam of the esophagus was otherwise normal.                           The gastric fundus and gastric body were normal. Complications:            No immediate complications. Estimated blood loss:                            Minimal. Estimated Blood Loss:     Estimated blood loss was minimal. Impression:               - Esophagogastric landmarks identified.                           - 2 cm hiatal hernia.                           - Benign-appearing esophageal stenosis. Dilated to                             18mm with good result.                           - Normal gastric fundus and gastric body. Recommendation:           - Patient has a contact number available for                            emergencies. The signs and symptoms of potential                            delayed complications were discussed with the                            patient. Return to normal activities tomorrow.                            Written discharge  instructions were provided to the                            patient.                           - Resume previous diet.                           - Continue present medications                           - Please start protonix 40mg  daily as discussed                           - Repeat upper endoscopy PRN for retreatment if                            symptoms recur. Viviann SpareSteven P. Armbruster MD, MD 08/31/2016 9:22:55 AM This report has been signed electronically.

## 2016-08-31 NOTE — Progress Notes (Signed)
Called to room to assist during endoscopic procedure.  Patient ID and intended procedure confirmed with present staff. Received instructions for my participation in the procedure from the performing physician.  

## 2016-08-31 NOTE — Patient Instructions (Signed)
YOU HAD AN ENDOSCOPIC PROCEDURE TODAY AT THE [facility]:   Refer to the procedure report that was given to you for any specific questions about what was found during the examination.  If the procedure report does not answer your questions, please call your gastroenterologist to clarify.  If you requested that your care partner not be given the details of your procedure findings, then the procedure report has been included in a sealed envelope for you to review at your convenience later.  YOU SHOULD EXPECT: Some feelings of bloating in the abdomen. Passage of more gas than usual.  Walking can help get rid of the air that was put into your GI tract during the procedure and reduce the bloating. If you had a lower endoscopy (such as a colonoscopy or flexible sigmoidoscopy) you may notice spotting of blood in your stool or on the toilet paper. If you underwent a bowel prep for your procedure, you may not have a normal bowel movement for a few days.  Please Note:  You might notice some irritation and congestion in your nose or some drainage.  This is from the oxygen used during your procedure.  There is no need for concern and it should clear up in a day or so.  SYMPTOMS TO REPORT IMMEDIATELY:    Following upper endoscopy (EGD)  Vomiting of blood or coffee ground material  New chest pain or pain under the shoulder blades  Painful or persistently difficult swallowing  New shortness of breath  Fever of 100F or higher  Black, tarry-looking stools  For urgent or emergent issues, a gastroenterologist can be reached at any hour by calling [phone].   DIET:  Nothing to eat or drink until 1015, then clear liquids until 115, then soft diet til tomorrow am.   Drink plenty of fluids but you should avoid alcoholic beverages for 24 hours.  ACTIVITY:  You should plan to take it easy for the rest of today and you should NOT DRIVE or use heavy machinery until tomorrow (because of the sedation  medicines used during the test).    FOLLOW UP: Our staff will call the number listed on your records the next business day following your procedure to check on you and address any questions or concerns that you may have regarding the information given to you following your procedure. If we do not reach you, we will leave a message.  However, if you are feeling well and you are not experiencing any problems, there is no need to return our call.  We will assume that you have returned to your regular daily activities without incident.  If any biopsies were taken you will be contacted by phone or by letter within the next 1-3 weeks.  Please call us at [phone] if you have not heard about the biopsies in 3 weeks.    SIGNATURES/CONFIDENTIALITY: You and/or your care partner have signed paperwork which will be entered into your electronic medical record.  These signatures attest to the fact that that the information above on your After Visit Summary has been reviewed and is understood.  Full responsibility of the confidentiality of this discharge information lies with you and/or your care-partner.  Thank you for letting us take care of your healthcare needs today.

## 2016-09-01 ENCOUNTER — Telehealth: Payer: Self-pay | Admitting: *Deleted

## 2016-09-01 ENCOUNTER — Other Ambulatory Visit: Payer: Self-pay

## 2016-09-01 NOTE — Telephone Encounter (Signed)
  Follow up Call-  Call back number 08/31/2016 07/26/2016 06/30/2016 01/11/2016 12/20/2015 09/06/2015  Post procedure Call Back phone  # [phone]  Permission to leave phone message Yes Yes Yes Yes Yes Yes  Some recent data might be hidden     Patient questions:  Do you have a fever, pain , or abdominal swelling? No. Pain Score  0 *  Have you tolerated food without any problems? Yes.    Have you been able to return to your normal activities? Yes.    Do you have any questions about your discharge instructions: Diet   No. Medications  No. Follow up visit  No.  Do you have questions or concerns about your Care? Yes.    Actions: * If pain score is 4 or above: No action needed, pain <4.

## 2016-09-20 DIAGNOSIS — M9904 Segmental and somatic dysfunction of sacral region: Secondary | ICD-10-CM | POA: Diagnosis not present

## 2016-09-20 DIAGNOSIS — M9903 Segmental and somatic dysfunction of lumbar region: Secondary | ICD-10-CM | POA: Diagnosis not present

## 2016-09-20 DIAGNOSIS — M5137 Other intervertebral disc degeneration, lumbosacral region: Secondary | ICD-10-CM | POA: Diagnosis not present

## 2016-09-20 DIAGNOSIS — M9902 Segmental and somatic dysfunction of thoracic region: Secondary | ICD-10-CM | POA: Diagnosis not present

## 2016-09-21 DIAGNOSIS — M9904 Segmental and somatic dysfunction of sacral region: Secondary | ICD-10-CM | POA: Diagnosis not present

## 2016-09-21 DIAGNOSIS — M9903 Segmental and somatic dysfunction of lumbar region: Secondary | ICD-10-CM | POA: Diagnosis not present

## 2016-09-21 DIAGNOSIS — M9902 Segmental and somatic dysfunction of thoracic region: Secondary | ICD-10-CM | POA: Diagnosis not present

## 2016-09-21 DIAGNOSIS — M5137 Other intervertebral disc degeneration, lumbosacral region: Secondary | ICD-10-CM | POA: Diagnosis not present

## 2016-09-22 DIAGNOSIS — M9903 Segmental and somatic dysfunction of lumbar region: Secondary | ICD-10-CM | POA: Diagnosis not present

## 2016-09-22 DIAGNOSIS — M9904 Segmental and somatic dysfunction of sacral region: Secondary | ICD-10-CM | POA: Diagnosis not present

## 2016-09-22 DIAGNOSIS — M9902 Segmental and somatic dysfunction of thoracic region: Secondary | ICD-10-CM | POA: Diagnosis not present

## 2016-09-22 DIAGNOSIS — M5137 Other intervertebral disc degeneration, lumbosacral region: Secondary | ICD-10-CM | POA: Diagnosis not present

## 2016-09-25 DIAGNOSIS — M9902 Segmental and somatic dysfunction of thoracic region: Secondary | ICD-10-CM | POA: Diagnosis not present

## 2016-09-25 DIAGNOSIS — M9904 Segmental and somatic dysfunction of sacral region: Secondary | ICD-10-CM | POA: Diagnosis not present

## 2016-09-25 DIAGNOSIS — M5137 Other intervertebral disc degeneration, lumbosacral region: Secondary | ICD-10-CM | POA: Diagnosis not present

## 2016-09-25 DIAGNOSIS — M9903 Segmental and somatic dysfunction of lumbar region: Secondary | ICD-10-CM | POA: Diagnosis not present

## 2016-09-26 DIAGNOSIS — M9904 Segmental and somatic dysfunction of sacral region: Secondary | ICD-10-CM | POA: Diagnosis not present

## 2016-09-26 DIAGNOSIS — M9902 Segmental and somatic dysfunction of thoracic region: Secondary | ICD-10-CM | POA: Diagnosis not present

## 2016-09-26 DIAGNOSIS — M9903 Segmental and somatic dysfunction of lumbar region: Secondary | ICD-10-CM | POA: Diagnosis not present

## 2016-09-26 DIAGNOSIS — M5137 Other intervertebral disc degeneration, lumbosacral region: Secondary | ICD-10-CM | POA: Diagnosis not present

## 2016-09-27 DIAGNOSIS — M5137 Other intervertebral disc degeneration, lumbosacral region: Secondary | ICD-10-CM | POA: Diagnosis not present

## 2016-09-27 DIAGNOSIS — M9904 Segmental and somatic dysfunction of sacral region: Secondary | ICD-10-CM | POA: Diagnosis not present

## 2016-09-27 DIAGNOSIS — M9903 Segmental and somatic dysfunction of lumbar region: Secondary | ICD-10-CM | POA: Diagnosis not present

## 2016-09-27 DIAGNOSIS — M9902 Segmental and somatic dysfunction of thoracic region: Secondary | ICD-10-CM | POA: Diagnosis not present

## 2016-10-02 DIAGNOSIS — M9903 Segmental and somatic dysfunction of lumbar region: Secondary | ICD-10-CM | POA: Diagnosis not present

## 2016-10-02 DIAGNOSIS — M9902 Segmental and somatic dysfunction of thoracic region: Secondary | ICD-10-CM | POA: Diagnosis not present

## 2016-10-02 DIAGNOSIS — M9904 Segmental and somatic dysfunction of sacral region: Secondary | ICD-10-CM | POA: Diagnosis not present

## 2016-10-02 DIAGNOSIS — M5137 Other intervertebral disc degeneration, lumbosacral region: Secondary | ICD-10-CM | POA: Diagnosis not present

## 2016-10-05 DIAGNOSIS — M5137 Other intervertebral disc degeneration, lumbosacral region: Secondary | ICD-10-CM | POA: Diagnosis not present

## 2016-10-05 DIAGNOSIS — M9904 Segmental and somatic dysfunction of sacral region: Secondary | ICD-10-CM | POA: Diagnosis not present

## 2016-10-05 DIAGNOSIS — M9903 Segmental and somatic dysfunction of lumbar region: Secondary | ICD-10-CM | POA: Diagnosis not present

## 2016-10-05 DIAGNOSIS — M9902 Segmental and somatic dysfunction of thoracic region: Secondary | ICD-10-CM | POA: Diagnosis not present

## 2016-10-09 DIAGNOSIS — M5137 Other intervertebral disc degeneration, lumbosacral region: Secondary | ICD-10-CM | POA: Diagnosis not present

## 2016-10-09 DIAGNOSIS — M9903 Segmental and somatic dysfunction of lumbar region: Secondary | ICD-10-CM | POA: Diagnosis not present

## 2016-10-09 DIAGNOSIS — M9902 Segmental and somatic dysfunction of thoracic region: Secondary | ICD-10-CM | POA: Diagnosis not present

## 2016-10-09 DIAGNOSIS — M9904 Segmental and somatic dysfunction of sacral region: Secondary | ICD-10-CM | POA: Diagnosis not present

## 2016-10-16 DIAGNOSIS — M9904 Segmental and somatic dysfunction of sacral region: Secondary | ICD-10-CM | POA: Diagnosis not present

## 2016-10-16 DIAGNOSIS — M5137 Other intervertebral disc degeneration, lumbosacral region: Secondary | ICD-10-CM | POA: Diagnosis not present

## 2016-10-16 DIAGNOSIS — M9902 Segmental and somatic dysfunction of thoracic region: Secondary | ICD-10-CM | POA: Diagnosis not present

## 2016-10-16 DIAGNOSIS — M9903 Segmental and somatic dysfunction of lumbar region: Secondary | ICD-10-CM | POA: Diagnosis not present

## 2016-10-19 DIAGNOSIS — M5137 Other intervertebral disc degeneration, lumbosacral region: Secondary | ICD-10-CM | POA: Diagnosis not present

## 2016-10-19 DIAGNOSIS — M9903 Segmental and somatic dysfunction of lumbar region: Secondary | ICD-10-CM | POA: Diagnosis not present

## 2016-10-19 DIAGNOSIS — M9902 Segmental and somatic dysfunction of thoracic region: Secondary | ICD-10-CM | POA: Diagnosis not present

## 2016-10-19 DIAGNOSIS — M9904 Segmental and somatic dysfunction of sacral region: Secondary | ICD-10-CM | POA: Diagnosis not present

## 2017-01-08 DIAGNOSIS — N898 Other specified noninflammatory disorders of vagina: Secondary | ICD-10-CM | POA: Diagnosis not present

## 2017-01-08 DIAGNOSIS — L298 Other pruritus: Secondary | ICD-10-CM | POA: Diagnosis not present

## 2017-01-08 DIAGNOSIS — B373 Candidiasis of vulva and vagina: Secondary | ICD-10-CM | POA: Diagnosis not present

## 2017-04-12 ENCOUNTER — Encounter: Payer: Self-pay | Admitting: Gastroenterology

## 2017-04-12 ENCOUNTER — Ambulatory Visit (INDEPENDENT_AMBULATORY_CARE_PROVIDER_SITE_OTHER): Payer: Federal, State, Local not specified - PPO | Admitting: Gastroenterology

## 2017-04-12 VITALS — BP 144/66 | HR 71 | Ht 69.0 in | Wt 191.0 lb

## 2017-04-12 DIAGNOSIS — R1310 Dysphagia, unspecified: Secondary | ICD-10-CM

## 2017-04-12 DIAGNOSIS — K219 Gastro-esophageal reflux disease without esophagitis: Secondary | ICD-10-CM | POA: Diagnosis not present

## 2017-04-12 MED ORDER — RABEPRAZOLE SODIUM 20 MG PO TBEC: 20.0000 mg | DELAYED_RELEASE_TABLET | Freq: Two times a day (BID) | ORAL | 0 refills | Status: DC

## 2017-04-12 NOTE — Patient Instructions (Addendum)
If you are age 57 or older, your body mass index should be between 23-30. Your Body mass index is 28.21 kg/m. If this is out of the aforementioned range listed, please consider follow up with your Primary Care Provider.  If you are age 57 or younger, your body mass index should be between 19-25. Your Body mass index is 28.21 kg/m. If this is out of the aformentioned range listed, please consider follow up with your Primary Care Provider.   You have been scheduled for an endoscopy. Please follow written instructions given to you at your visit today. If you use inhalers (even only as needed), please bring them with you on the day of your procedure. Your physician has requested that you go to [email or web address] and enter the access code given to you at your visit today. This web site gives a general overview about your procedure. However, you should still follow specific instructions given to you by our office regarding your preparation for the procedure.  We have sent the following medications to your pharmacy for you to pick up at your convenience:  Aciphex  Please use Gaviscon as needed.  Thank you.

## 2017-04-12 NOTE — Progress Notes (Signed)
HPI :  56 year old female well known to me for history of reflux and severe peptic stricture, here for follow-up visit. She last had an endoscopy in October 2017, at which point her stricture was dilated to 18 mm with a good result.  She reported she had significant benefit in her dysphagia after this procedure. Unfortunately over time her dysphagia has recurred, occurring to both solids and liquids. She also has severe heartburn which is bothering her as well as regurgitation.  Symptoms of dysphagia recurred a few months ago.   She had been placed on protonix but didn't like the way it made her feel, placed on Zantac and taking it 3-4 times per day.  She did not like the way protonix made her feel, she reported she felt "sick" on it and made her feel bad. She also had a similar reaction to omeprazole. She had been on nexium in the past which she did not tolerate as well - she reports all trial of PPIs have caused her to have a "bubbling" in her stomach and make her feel "bad". Prevacid caused headaches. She has not tried aciphex before. She has not tried Air cabin crewDexilant. She has been on carafate in the past, also does not like the way it made her feel.   Endoscopic history EGD 08/31/2016 - 2cm hiatal hernia, stricture dilated to 18mm EGD 07/26/16 - GEJ stricture dilated to 16mm EGD 06/30/16 - GEJ stricture dilated to 13mm EGD 01/11/16 - GEJ stricture dilated to 13mm EGD 12/20/15 - 7mm GEJ stircture, dilated to 10.325mm, biopsies benign, no EoE EGD 09/06/15 - 7-798mm GEJ stricture, dilated with endoscope, biopsies benign Colonoscopy in 2013 or so, she thinks it was normal. No records of this available. No FH of CRC    Past Medical History:  Diagnosis Date  . Allergy    seasonal  . Anticoagulated on warfarin    off since 2015  . Arthritis    ankle- left   . Asthma    no problems in years, rarely uses inhaler  . Clotting disorder (HCC)    DVT,PE  . DVT (deep venous thrombosis) (HCC)    left leg in  2014  . GERD (gastroesophageal reflux disease)   . Heart murmur    pt denies  . Hyperlipidemia   . PE (pulmonary embolism) 04/2014  . Uses hearing aid    bilateral  . Wears partial dentures    upper dentures and lower partial     Past Surgical History:  Procedure Laterality Date  . ABDOMINAL HYSTERECTOMY    . APPENDECTOMY    . COLONOSCOPY    . ESOPHAGOGASTRODUODENOSCOPY (EGD) WITH ESOPHAGEAL DILATION     x3  . FOOT SURGERY Bilateral    BONE SPURS  . KNEE SURGERY Right   . OPEN REDUCTION INTERNAL FIXATION (ORIF) DISTAL RADIAL FRACTURE Right 12/02/2014   Procedure: OPEN REDUCTION INTERNAL FIXATION (ORIF) DISTAL RADIAL FRACTURE;  Surgeon: Eldred MangesMark C Yates, MD;  Location: [facility] OR;  Service: Orthopedics;  Laterality: Right;  . UPPER GASTROINTESTINAL ENDOSCOPY     Family History  Problem Relation Age of Onset  . Heart disease Mother   . Heart disease Father        PACEMAKER  . Breast cancer Sister   . Breast cancer Maternal Grandmother   . Diabetes Maternal Grandmother   . Esophageal cancer Maternal Uncle   . Colon cancer Neg Hx   . Colon polyps Neg Hx   . Rectal cancer Neg Hx   .  Stomach cancer Neg Hx    Social History  Substance Use Topics  . Smoking status: Former Smoker    Packs/day: 20.00    Types: Cigarettes    Quit date: 11/07/1991  . Smokeless tobacco: Never Used  . Alcohol use No   Current Outpatient Prescriptions  Medication Sig Dispense Refill  . ranitidine (ZANTAC) 150 MG tablet Take 150 mg by mouth daily.    . rosuvastatin (CRESTOR) 5 MG tablet Take 5 mg by mouth daily.     Current Facility-Administered Medications  Medication Dose Route Frequency Provider Last Rate Last Dose  . 0.9 %  sodium chloride infusion  500 mL Intravenous Continuous Daeshon Grammatico, Reeves Forth, MD      . 0.9 %  sodium chloride infusion  500 mL Intravenous Continuous Sedale Jenifer, Reeves Forth, MD       Allergies  Allergen Reactions  . Xarelto [Rivaroxaban] Other (See Comments)    nausea    . Pradaxa [Dabigatran Etexilate Mesylate] Nausea Only     Review of Systems: All systems reviewed and negative except where noted in HPI.   Lab Results  Component Value Date   WBC 9.0 12/02/2014   HGB 13.4 12/02/2014   HCT 40.4 12/02/2014   MCV 91.6 12/02/2014   PLT 281 12/02/2014    Lab Results  Component Value Date   CREATININE 0.81 08/28/2016   BUN 9 08/28/2016   NA 140 08/28/2016   K 4.1 08/28/2016   CL 105 08/28/2016   CO2 29 08/28/2016    Lab Results  Component Value Date   ALT 19 12/02/2014   AST 21 12/02/2014   ALKPHOS 91 12/02/2014   BILITOT 0.5 12/02/2014     Physical Exam: BP (!) 144/66 (BP Location: Left Arm, Patient Position: Sitting, Cuff Size: Normal)   Pulse 71   Ht 5\' 9"  (1.753 m)   Wt 191 lb (86.6 kg)   BMI 28.21 kg/m  Constitutional: Pleasant,well-developed, female in no acute distress. HEENT: Normocephalic and atraumatic. Conjunctivae are normal. No scleral icterus. Neck supple.  Cardiovascular: Normal rate, regular rhythm.  Pulmonary/chest: Effort normal and breath sounds normal. No wheezing, rales or rhonchi. Abdominal: Soft, nondistended, nontender.  There are no masses palpable. No hepatomegaly. Extremities: no edema Lymphadenopathy: No cervical adenopathy noted. Neurological: Alert and oriented to person place and time. Skin: Skin is warm and dry. No rashes noted. Psychiatric: Normal mood and affect. Behavior is normal.   ASSESSMENT AND PLAN: 56 year old female here for reassessment of GERD and dysphagia.  I think her GERD is driving recurrent peptic stricture at the GEJ. Stricture had been severe and required multiple dilations to safely get to an appropriate sized lumen which did relieve her symptoms for a period of time, now with recurrent dysphagia.  Unfortunately she's had an intolerance to multiple trials of PPIs in the past, and she remains very symptomatic with reflux despite Zantac use. In this light she may be a  candidate for reflux surgery. I'm recommending a repeat endoscopy with dilation to treat her dysphagia. She's never been on AcipHex, we'll try it at 20 mg twice a day, to see if she tolerates it, he can use Gaviscon or Maalox as needed for breakthrough. We'll await her course, but if reflux remains poorly controlled or intolerant to AcipHex, will obtain esophageal manometry and pH testing and if she is a candidate, we'll refer her for surgical evaluation. Discussed risks and benefits of EGD with dilation, she wished proceed.  Ileene Patrick, MD [facility] Pager  [phone]  

## 2017-04-19 ENCOUNTER — Ambulatory Visit (AMBULATORY_SURGERY_CENTER): Payer: Federal, State, Local not specified - PPO | Admitting: Gastroenterology

## 2017-04-19 ENCOUNTER — Encounter: Payer: Self-pay | Admitting: Gastroenterology

## 2017-04-19 VITALS — BP 134/78 | HR 62 | Temp 98.9°F | Resp 16 | Ht 69.0 in | Wt 191.0 lb

## 2017-04-19 DIAGNOSIS — K222 Esophageal obstruction: Secondary | ICD-10-CM | POA: Diagnosis not present

## 2017-04-19 DIAGNOSIS — K319 Disease of stomach and duodenum, unspecified: Secondary | ICD-10-CM | POA: Diagnosis not present

## 2017-04-19 DIAGNOSIS — R1319 Other dysphagia: Secondary | ICD-10-CM | POA: Diagnosis present

## 2017-04-19 DIAGNOSIS — R1310 Dysphagia, unspecified: Secondary | ICD-10-CM | POA: Diagnosis not present

## 2017-04-19 DIAGNOSIS — K2980 Duodenitis without bleeding: Secondary | ICD-10-CM

## 2017-04-19 MED ORDER — SODIUM CHLORIDE 0.9 % IV SOLN: 500.0000 mL | INTRAVENOUS | Status: DC

## 2017-04-19 NOTE — Progress Notes (Signed)
Report given to PACU, vss 

## 2017-04-19 NOTE — Progress Notes (Signed)
Pt's states no medical or surgical changes since previsit or office visit. 

## 2017-04-19 NOTE — Patient Instructions (Signed)
YOU HAD AN ENDOSCOPIC PROCEDURE TODAY AT THE [facility]:   Refer to the procedure report that was given to you for any specific questions about what was found during the examination.  If the procedure report does not answer your questions, please call your gastroenterologist to clarify.  If you requested that your care partner not be given the details of your procedure findings, then the procedure report has been included in a sealed envelope for you to review at your convenience later.  YOU SHOULD EXPECT: Some feelings of bloating in the abdomen. Passage of more gas than usual.  Walking can help get rid of the air that was put into your GI tract during the procedure and reduce the bloating. If you had a lower endoscopy (such as a colonoscopy or flexible sigmoidoscopy) you may notice spotting of blood in your stool or on the toilet paper. If you underwent a bowel prep for your procedure, you may not have a normal bowel movement for a few days.  Please Note:  You might notice some irritation and congestion in your nose or some drainage.  This is from the oxygen used during your procedure.  There is no need for concern and it should clear up in a day or so.  SYMPTOMS TO REPORT IMMEDIATELY:   Following lower endoscopy (colonoscopy or flexible sigmoidoscopy):  Excessive amounts of blood in the stool  Significant tenderness or worsening of abdominal pains  Swelling of the abdomen that is new, acute  Fever of 100F or higher   Following upper endoscopy (EGD)  Vomiting of blood or coffee ground material  New chest pain or pain under the shoulder blades  Painful or persistently difficult swallowing  New shortness of breath  Fever of 100F or higher  Black, tarry-looking stools  For urgent or emergent issues, a gastroenterologist can be reached at any hour by calling [phone].   DIET:  We do recommend a small meal at first, but then you may proceed to your regular diet.  Drink  plenty of fluids but you should avoid alcoholic beverages for 24 hours.  ACTIVITY:  You should plan to take it easy for the rest of today and you should NOT DRIVE or use heavy machinery until tomorrow (because of the sedation medicines used during the test).    FOLLOW UP: Our staff will call the number listed on your records the next business day following your procedure to check on you and address any questions or concerns that you may have regarding the information given to you following your procedure. If we do not reach you, we will leave a message.  However, if you are feeling well and you are not experiencing any problems, there is no need to return our call.  We will assume that you have returned to your regular daily activities without incident.  If any biopsies were taken you will be contacted by phone or by letter within the next 1-3 weeks.  Please call us at [phone] if you have not heard about the biopsies in 3 weeks.    SIGNATURES/CONFIDENTIALITY: You and/or your care partner have signed paperwork which will be entered into your electronic medical record.  These signatures attest to the fact that that the information above on your After Visit Summary has been reviewed and is understood.  Full responsibility of the confidentiality of this discharge information lies with you and/or your care-partner.  After dilation diet.  ZANTAC 150MG . Twice daily/  Gaviscon for  breakthrough  Hiatal hernia information given.

## 2017-04-19 NOTE — Op Note (Signed)
[facility] Patient Name: Whitney Peters Procedure Date: 04/19/2017 3:57 PM MRN: [id number] Endoscopist: Viviann SpareSteven P. Armbruster MD, MD Age: 89 Referring MD:  Date of Birth: 11-Jul-1961 Gender: Female Account #: [id number] Procedure:                Upper GI endoscopy Indications:              Dysphagia, GERD, intolerant to multiple PPIs -                            history of multiple dilations in the past year Medicines:                Monitored Anesthesia Care Procedure:                Pre-Anesthesia Assessment:                           - Prior to the procedure, a History and Physical                            was performed, and patient medications and                            allergies were reviewed. The patient's tolerance of                            previous anesthesia was also reviewed. The risks                            and benefits of the procedure and the sedation                            options and risks were discussed with the patient.                            All questions were answered, and informed consent                            was obtained. Prior Anticoagulants: The patient has                            taken no previous anticoagulant or antiplatelet                            agents. ASA Grade Assessment: III - A patient with                            severe systemic disease. After reviewing the risks                            and benefits, the patient was deemed in                            satisfactory condition to undergo the procedure.  After obtaining informed consent, the endoscope was                            passed under direct vision. Throughout the                            procedure, the patient's blood pressure, pulse, and                            oxygen saturations were monitored continuously. The                            Endoscope was introduced through the mouth, and    advanced to the second part of duodenum. The upper                            GI endoscopy was accomplished without difficulty.                            The patient tolerated the procedure well. Scope In: Scope Out: Findings:                 Esophagogastric landmarks were identified: the                            Z-line was found at 38 cm, the gastroesophageal                            junction was found at 38 cm and the upper extent of                            the gastric folds was found at 40 cm from the                            incisors.                           A 2 cm hiatal hernia was present.                           One moderate (circumferential scarring or stenosis;                            an endoscope may pass) benign-appearing, intrinsic                            stenosis was found 38 cm from the incisors. This                            measured less than one cm (in length) and was                            traversed. A TTS dilator was passed through the  scope. Dilation was initially performed with a 13.5                            to 15.34mm balloon with a small mucosal wrent noted.                            Additional dilation with a 16-17-18 mm balloon                            dilator was performed to 18 mm at which point a                            moderate mucosal wrent was noted. Biopsies were                            then taken with a cold forceps for histology.                           The exam of the esophagus was otherwise normal.                           The exam of the stomach was otherwise normal.                           A single 5 mm nodular area with a localized                            distribution was found in the duodenal bulb.                            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                           The exam of the duodenum was otherwise normal. Complications:            No immediate complications. Estimated blood loss:                            Minimal. Estimated Blood Loss:     Estimated blood loss was minimal. Impression:               - Esophagogastric landmarks identified.                           - 2 cm hiatal hernia.                           - Benign-appearing esophageal stenosis. Dilated to  18mm with good result. Biopsied.                           - Benign appearing nodular area found in the                            duodenum. Biopsied. Recommendation:           - Patient has a contact number available for                            emergencies. The signs and symptoms of potential                            delayed complications were discussed with the                            patient. Return to normal activities tomorrow.                            Written discharge instructions were provided to the                            patient.                           - Resume previous diet.                           - Continue present medications.                           - Start zantac 150mg  twice daily if intolerant to                            trial of Aciphex (you have been intolerant to                            multiple trials of proton-pump inhibitors).                            Gaviscon as needed for breakthrough symptoms                           - Await pathology results and course following                            dilation Steven P. Armbruster MD, MD 04/19/2017 4:38:12 PM This report has been signed electronically.

## 2017-04-20 ENCOUNTER — Telehealth: Payer: Self-pay | Admitting: *Deleted

## 2017-04-20 NOTE — Telephone Encounter (Signed)
  Follow up Call-  Call back number 04/19/2017 08/31/2016 07/26/2016 06/30/2016 01/11/2016 12/20/2015 09/06/2015  Post procedure Call Back phone  # [phone]  Permission to leave phone message Yes Yes Yes Yes Yes Yes Yes  Some recent data might be hidden     Patient questions:  Do you have a fever, pain , or abdominal swelling? No. Pain Score  0 *  Have you tolerated food without any problems? Yes.    Have you been able to return to your normal activities? Yes.    Do you have any questions about your discharge instructions: Diet   No. Medications  No. Follow up visit  No.  Do you have questions or concerns about your Care? No.  Actions: * If pain score is 4 or above: No action needed, pain <4.

## 2017-04-23 DIAGNOSIS — R21 Rash and other nonspecific skin eruption: Secondary | ICD-10-CM | POA: Diagnosis not present

## 2017-04-23 DIAGNOSIS — B009 Herpesviral infection, unspecified: Secondary | ICD-10-CM | POA: Diagnosis not present

## 2017-04-23 DIAGNOSIS — R238 Other skin changes: Secondary | ICD-10-CM | POA: Diagnosis not present

## 2017-04-25 ENCOUNTER — Encounter: Payer: Self-pay | Admitting: Gastroenterology

## 2017-06-20 DIAGNOSIS — E559 Vitamin D deficiency, unspecified: Secondary | ICD-10-CM | POA: Diagnosis not present

## 2017-06-20 DIAGNOSIS — Z131 Encounter for screening for diabetes mellitus: Secondary | ICD-10-CM | POA: Diagnosis not present

## 2017-06-20 DIAGNOSIS — L659 Nonscarring hair loss, unspecified: Secondary | ICD-10-CM | POA: Diagnosis not present

## 2017-06-20 DIAGNOSIS — G629 Polyneuropathy, unspecified: Secondary | ICD-10-CM | POA: Diagnosis not present

## 2017-06-20 DIAGNOSIS — Z0000 Encounter for general adult medical examination without abnormal findings: Secondary | ICD-10-CM | POA: Diagnosis not present

## 2017-06-20 DIAGNOSIS — Z136 Encounter for screening for cardiovascular disorders: Secondary | ICD-10-CM | POA: Diagnosis not present

## 2017-06-21 ENCOUNTER — Other Ambulatory Visit: Payer: Self-pay | Admitting: Family Medicine

## 2017-06-21 DIAGNOSIS — Z1239 Encounter for other screening for malignant neoplasm of breast: Secondary | ICD-10-CM

## 2017-06-28 ENCOUNTER — Ambulatory Visit (HOSPITAL_BASED_OUTPATIENT_CLINIC_OR_DEPARTMENT_OTHER)
Admission: RE | Admit: 2017-06-28 | Discharge: 2017-06-28 | Disposition: A | Discharge status: Home / Self Care | Payer: Federal, State, Local not specified - PPO | Source: Ambulatory Visit | Attending: Family Medicine | Admitting: Family Medicine

## 2017-06-28 DIAGNOSIS — Z1231 Encounter for screening mammogram for malignant neoplasm of breast: Secondary | ICD-10-CM | POA: Diagnosis not present

## 2017-06-28 DIAGNOSIS — Z1239 Encounter for other screening for malignant neoplasm of breast: Secondary | ICD-10-CM

## 2017-07-19 ENCOUNTER — Encounter: Payer: Self-pay | Admitting: Genetic Counselor

## 2017-07-19 ENCOUNTER — Telehealth: Payer: Self-pay | Admitting: Genetic Counselor

## 2017-07-19 NOTE — Telephone Encounter (Signed)
Genetic counseling appt has been scheduled for the pt to see Ofri on 9/20 at 1230pm. Pt aware to arrive 30 minutes early. Letter mailed to the pt and faxed to the referring.

## 2017-07-26 ENCOUNTER — Other Ambulatory Visit: Payer: Federal, State, Local not specified - PPO

## 2017-07-26 ENCOUNTER — Encounter: Payer: Self-pay | Admitting: Genetic Counselor

## 2017-07-26 ENCOUNTER — Ambulatory Visit (HOSPITAL_BASED_OUTPATIENT_CLINIC_OR_DEPARTMENT_OTHER): Payer: Federal, State, Local not specified - PPO | Admitting: Genetic Counselor

## 2017-07-26 DIAGNOSIS — Z803 Family history of malignant neoplasm of breast: Secondary | ICD-10-CM | POA: Diagnosis not present

## 2017-07-26 NOTE — Progress Notes (Signed)
[facility]etics [facility]      Initial Visit   Patient Name: Whitney Peters Patient DOB: May 18, 1961 Patient Age: 56 y.o. Encounter Date: 07/26/2017  Referring Provider: Lenell Antu, DO  Primary Care Provider: Lenell Antu, DO  Reason for Visit: Evaluate for hereditary susceptibility to cancer    Assessment and Plan:  . Whitney Peters's family history is not highly suggestive of a hereditary predisposition to cancer, although she did have one of her two sisters with breast cancer at age 57.    Marland Kitchen Testing for hereditary cancer risk is recommended to determine whether she has a pathogenic mutation that will impact her screening and risk-reduction for cancer.  . Whitney Peters did not wish to pursue genetic testing today for hereditary cancer risk. She inquired about testing for hereditary susceptibility to dementia which is not offered through our clinic.   . Whitney Peters is recommended to follow the cancer screening guidelines given to her by her primary provider, which include a yearly mammogram, clinical breast exam, and gynecologic exam. She had no colon polyps at age 67 by colonoscopy and was recommended to return in 10 years.  . Based on Whitney Peters's family history, we recommended her sister, who was diagnosed with breast cancer at age 28, have genetic counseling and testing. Whitney Peters will speak with her member and let us know if we can be of any assistance in coordinating genetic counseling and/or testing.   . Whitney Peters is encouraged to remain in contact with Cancer Genetics annually so that we can update the family history and inform her of any changes in cancer genetics and testing that may be of benefit for this family.    Dr. Darnelle Catalan was available for questions concerning this case. Total time spent by me in face-to-face counseling was approximately 30 minutes.   _____________________________________________________________________   History of  Present Illness: Whitney Peters, a 56 y.o. female, is being seen at the [facility] due to a family history of cancer. She presents to clinic today to discuss the possibility of a hereditary predisposition to cancer and discuss whether genetic testing is warranted. She indicated that she is more interested in her risk of demential than for cancer due to her mother having dementia that she described as progressing very rapidly.  Whitney Peters has no personal history of cancer. She reported having a yearly mammogram, clinical breast exam and gynecologic exam.  Past Medical History:  Diagnosis Date  . Allergy    seasonal  . Anticoagulated on warfarin    off since 2015  . Arthritis    ankle- left   . Asthma    no problems in years, rarely uses inhaler  . Clotting disorder (HCC)    DVT,PE  . DVT (deep venous thrombosis) (HCC)    left leg in 2014  . Family history of breast cancer   . GERD (gastroesophageal reflux disease)   . Heart murmur    pt denies  . Hyperlipidemia   . PE (pulmonary embolism) 04/2014  . Uses hearing aid    bilateral  . Wears partial dentures    upper dentures and lower partial    Past Surgical History:  Procedure Laterality Date  . ABDOMINAL HYSTERECTOMY    . APPENDECTOMY    . COLONOSCOPY    . ESOPHAGOGASTRODUODENOSCOPY (EGD) WITH ESOPHAGEAL DILATION     x3  . FOOT SURGERY Bilateral    BONE SPURS  .  KNEE SURGERY Right   . OPEN REDUCTION INTERNAL FIXATION (ORIF) DISTAL RADIAL FRACTURE Right 12/02/2014   Procedure: OPEN REDUCTION INTERNAL FIXATION (ORIF) DISTAL RADIAL FRACTURE;  Surgeon: Eldred Manges, MD;  Location: [facility] OR;  Service: Orthopedics;  Laterality: Right;  . UPPER GASTROINTESTINAL ENDOSCOPY      Social History   Social History  . Marital status: Married    Spouse name: N/A  . Number of children: N/A  . Years of education: N/A   Occupational History  . Student      [employer]   Social History Main Topics  . Smoking  status: Former Smoker    Packs/day: 20.00    Types: Cigarettes    Quit date: 11/07/1991  . Smokeless tobacco: Never Used  . Alcohol use No  . Drug use: No  . Sexual activity: Yes    Birth control/ protection: Surgical, Other-see comments     Comment: Hysterectomy   Other Topics Concern  . Not on file   Social History Narrative  . No narrative on file     Family History:  During the visit, a 4-generation pedigree was obtained. Family tree will be scanned in the Media tab in Epic  Significant diagnoses include the following:  Family History  Problem Relation Age of Onset  . Heart disease Mother   . Dementia Mother        deceased 13  . Heart disease Father        PACEMAKER  . Cancer Father        penile cancer; currently 1  . Breast cancer Sister 48       currently 110  . Breast cancer Maternal Grandmother 85       deceased 55s  . Diabetes Maternal Grandmother   . Esophageal cancer Maternal Uncle   . Colon cancer Neg Hx   . Colon polyps Neg Hx   . Rectal cancer Neg Hx   . Stomach cancer Neg Hx     Additionally, Whitney Peters has 2 sons and a daughter. She had a total of 3 brothers and 2 sisters. Her mother reportedly had non-melanoma skin cancer and rapid-onset dementia in her late 56s. Her mother had a total of 3 brothers. Her father had 3 brothers and a sister and very little is known about them.  Whitney Peters ancestry is Caucasian - NOS. There is no known Jewish ancestry and no consanguinity.  Discussion: We reviewed the characteristics, features and inheritance patterns of hereditary cancer syndromes. We discussed her risk of harboring a mutation in the context of her personal and family history. We discussed that her unknown paternal family makes risk assessment challenging. We discussed the process of genetic testing, insurance coverage and implications of results: positive, negative and variant of unknown significance (VUS).   Because of her questions related  to her mother's demential, we briefly discussed information about dementia and the challenges with clinical genetic testing for it.   Whitney Peters questions were answered to her satisfaction today and she is welcome to call with any additional questions or concerns. Thank you for the referral and allowing [place] to share in the care of your patient.    Elmer Picker, MS, CGC Certified Genetic Counselor phone: [phone] [email or web address]   ______________________________________________________________________ For Office Staff:  Number of people involved in session: 1 Was an Intern/ student involved with case: no

## 2017-08-01 DIAGNOSIS — S6991XA Unspecified injury of right wrist, hand and finger(s), initial encounter: Secondary | ICD-10-CM | POA: Diagnosis not present

## 2017-08-01 DIAGNOSIS — M79644 Pain in right finger(s): Secondary | ICD-10-CM | POA: Diagnosis not present

## 2017-08-02 ENCOUNTER — Other Ambulatory Visit: Payer: Self-pay | Admitting: Orthopedic Surgery

## 2017-08-02 DIAGNOSIS — S6991XA Unspecified injury of right wrist, hand and finger(s), initial encounter: Secondary | ICD-10-CM

## 2017-08-06 DIAGNOSIS — M25774 Osteophyte, right foot: Secondary | ICD-10-CM | POA: Diagnosis not present

## 2017-08-06 DIAGNOSIS — B351 Tinea unguium: Secondary | ICD-10-CM | POA: Diagnosis not present

## 2017-08-06 DIAGNOSIS — M25775 Osteophyte, left foot: Secondary | ICD-10-CM | POA: Diagnosis not present

## 2017-08-09 DIAGNOSIS — Z90710 Acquired absence of both cervix and uterus: Secondary | ICD-10-CM | POA: Diagnosis not present

## 2017-08-09 DIAGNOSIS — L659 Nonscarring hair loss, unspecified: Secondary | ICD-10-CM | POA: Diagnosis not present

## 2017-08-09 DIAGNOSIS — Z780 Asymptomatic menopausal state: Secondary | ICD-10-CM | POA: Diagnosis not present

## 2017-08-09 DIAGNOSIS — N760 Acute vaginitis: Secondary | ICD-10-CM | POA: Diagnosis not present

## 2017-08-09 DIAGNOSIS — Z01411 Encounter for gynecological examination (general) (routine) with abnormal findings: Secondary | ICD-10-CM | POA: Diagnosis not present

## 2017-08-09 DIAGNOSIS — G629 Polyneuropathy, unspecified: Secondary | ICD-10-CM | POA: Diagnosis not present

## 2017-08-27 ENCOUNTER — Ambulatory Visit
Admission: RE | Admit: 2017-08-27 | Discharge: 2017-08-27 | Disposition: A | Discharge status: Home / Self Care | Payer: Federal, State, Local not specified - PPO | Source: Ambulatory Visit | Attending: Orthopedic Surgery | Admitting: Orthopedic Surgery

## 2017-08-27 DIAGNOSIS — S6990XA Unspecified injury of unspecified wrist, hand and finger(s), initial encounter: Secondary | ICD-10-CM | POA: Diagnosis not present

## 2017-08-27 DIAGNOSIS — S6991XA Unspecified injury of right wrist, hand and finger(s), initial encounter: Secondary | ICD-10-CM

## 2017-08-27 MED ORDER — IOPAMIDOL (ISOVUE-M 200) INJECTION 41%
20.0000 mL | Freq: Once | INTRAMUSCULAR | Status: AC
  Administered 2017-08-27: 20 mL via INTRA_ARTICULAR

## 2017-09-06 DIAGNOSIS — B351 Tinea unguium: Secondary | ICD-10-CM | POA: Diagnosis not present

## 2017-09-06 DIAGNOSIS — M7752 Other enthesopathy of left foot: Secondary | ICD-10-CM | POA: Diagnosis not present

## 2017-09-19 DIAGNOSIS — M25775 Osteophyte, left foot: Secondary | ICD-10-CM | POA: Diagnosis not present

## 2017-09-20 DIAGNOSIS — Z01812 Encounter for preprocedural laboratory examination: Secondary | ICD-10-CM | POA: Diagnosis not present

## 2017-10-01 ENCOUNTER — Emergency Department (HOSPITAL_COMMUNITY): Payer: Federal, State, Local not specified - PPO

## 2017-10-01 ENCOUNTER — Other Ambulatory Visit: Payer: Self-pay

## 2017-10-01 ENCOUNTER — Encounter (HOSPITAL_COMMUNITY): Payer: Self-pay | Admitting: Emergency Medicine

## 2017-10-01 ENCOUNTER — Emergency Department (HOSPITAL_COMMUNITY)
Admission: EM | Admit: 2017-10-01 | Discharge: 2017-10-01 | Disposition: A | Discharge status: Home / Self Care | Payer: Federal, State, Local not specified - PPO | Attending: Emergency Medicine | Admitting: Emergency Medicine

## 2017-10-01 DIAGNOSIS — Z87891 Personal history of nicotine dependence: Secondary | ICD-10-CM | POA: Insufficient documentation

## 2017-10-01 DIAGNOSIS — Y939 Activity, unspecified: Secondary | ICD-10-CM | POA: Insufficient documentation

## 2017-10-01 DIAGNOSIS — M79605 Pain in left leg: Secondary | ICD-10-CM | POA: Diagnosis not present

## 2017-10-01 DIAGNOSIS — X58XXXA Exposure to other specified factors, initial encounter: Secondary | ICD-10-CM | POA: Diagnosis not present

## 2017-10-01 DIAGNOSIS — Z7901 Long term (current) use of anticoagulants: Secondary | ICD-10-CM | POA: Insufficient documentation

## 2017-10-01 DIAGNOSIS — Y999 Unspecified external cause status: Secondary | ICD-10-CM | POA: Diagnosis not present

## 2017-10-01 DIAGNOSIS — Y929 Unspecified place or not applicable: Secondary | ICD-10-CM | POA: Diagnosis not present

## 2017-10-01 DIAGNOSIS — S76292A Other injury of adductor muscle, fascia and tendon of left thigh, initial encounter: Secondary | ICD-10-CM | POA: Diagnosis not present

## 2017-10-01 DIAGNOSIS — S76212A Strain of adductor muscle, fascia and tendon of left thigh, initial encounter: Secondary | ICD-10-CM | POA: Insufficient documentation

## 2017-10-01 DIAGNOSIS — Z79899 Other long term (current) drug therapy: Secondary | ICD-10-CM | POA: Insufficient documentation

## 2017-10-01 DIAGNOSIS — J45909 Unspecified asthma, uncomplicated: Secondary | ICD-10-CM | POA: Insufficient documentation

## 2017-10-01 DIAGNOSIS — Z86711 Personal history of pulmonary embolism: Secondary | ICD-10-CM | POA: Insufficient documentation

## 2017-10-01 LAB — BASIC METABOLIC PANEL
Anion gap: 10 (ref 5–15)
BUN: 7 mg/dL (ref 6–20)
CHLORIDE: 103 mmol/L (ref 101–111)
CO2: 25 mmol/L (ref 22–32)
CREATININE: 0.82 mg/dL (ref 0.44–1.00)
Calcium: 9.2 mg/dL (ref 8.9–10.3)
GFR calc Af Amer: >60 mL/min (ref >60)
GFR calc non Af Amer: >60 mL/min (ref >60)
GLUCOSE: 100 mg/dL — AB (ref 65–99)
Potassium: 4.1 mmol/L (ref 3.5–5.1)
Sodium: 138 mmol/L (ref 135–145)

## 2017-10-01 LAB — MAGNESIUM: Magnesium: 2 mg/dL (ref 1.7–2.4)

## 2017-10-01 MED ORDER — IBUPROFEN 400 MG PO TABS: 400.0000 mg | ORAL_TABLET | Freq: Four times a day (QID) | ORAL | 0 refills | Status: DC | PRN

## 2017-10-01 MED ORDER — CYCLOBENZAPRINE HCL 10 MG PO TABS
10.0000 mg | ORAL_TABLET | Freq: Three times a day (TID) | ORAL | 0 refills | Status: DC
Start: 2017-10-01 — End: 2017-12-26

## 2017-10-01 NOTE — ED Provider Notes (Signed)
[facility] EMERGENCY DEPARTMENT Provider Note   CSN: [id number] Arrival date & time: 10/01/17  [id number]     History   Chief Complaint Chief Complaint  Patient presents with  . Leg Pain    HPI Whitney Peters is a 56 y.o. female.  Patient is a [age] year old female who presents to the emergency department with a complaint of left upper leg pain and cramping.  The patient states this problem started about 2 days ago.  She does not recall any injury.  She states she has not been doing any additional walking, bending, stooping, pushing, or pulling.  She has not been doing doing any additional climbing.  Is been no injury to the lower extremities.  No recent changes in medications.  The patient states that she has had problems with deep vein thrombus in the past she has been on anticoagulation medication.  She is currently off of anticoagulation medication, but was fearful that this pain felt very similar to previous deep vein thrombosis she has had in the past.  The patient denies any excessive swelling of the leg.  The leg has not felt hot.  There is been no loss of function of the left lower extremity.  Patient has not been on any long trips, and is not been for any great length of time.  She presents now for evaluation of the lower extremity.   The history is provided by the patient.    Past Medical History:  Diagnosis Date  . Allergy    seasonal  . Anticoagulated on warfarin    off since 2015  . Arthritis    ankle- left   . Asthma    no problems in years, rarely uses inhaler  . Clotting disorder (HCC)    DVT,PE  . DVT (deep venous thrombosis) (HCC)    left leg in 2014  . Family history of breast cancer   . GERD (gastroesophageal reflux disease)   . Heart murmur    pt denies  . Hyperlipidemia   . PE (pulmonary embolism) 04/2014  . Uses hearing aid    bilateral  . Wears partial dentures    upper dentures and lower partial    Patient Active Problem List   Diagnosis Date  Noted  . Family history of breast cancer   . Esophageal stricture 08/28/2016  . Wrist fracture 12/02/2014  . 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 GERD (gastroesophageal reflux disease) 01/22/2014  . Hyperlipidemia LDL goal < 100 01/22/2014  . Hx pulmonary embolism 01/22/2014  . Vitamin D deficiency 01/22/2014    Past Surgical History:  Procedure Laterality Date  . ABDOMINAL HYSTERECTOMY    . APPENDECTOMY    . COLONOSCOPY    . ESOPHAGOGASTRODUODENOSCOPY (EGD) WITH ESOPHAGEAL DILATION     x3  . FOOT SURGERY Bilateral    BONE SPURS  . KNEE SURGERY Right   . OPEN REDUCTION INTERNAL FIXATION (ORIF) DISTAL RADIAL FRACTURE Right 12/02/2014   Procedure: OPEN REDUCTION INTERNAL FIXATION (ORIF) DISTAL RADIAL FRACTURE;  Surgeon: Eldred MangesMark C Yates, MD;  Location: [facility] OR;  Service: Orthopedics;  Laterality: Right;  . UPPER GASTROINTESTINAL ENDOSCOPY      OB History    No data available       Home Medications    Prior to Admission medications   Medication Sig Start Date End Date Taking? Authorizing Provider  RABEprazole (ACIPHEX) 20 MG tablet Take 1 tablet (20 mg total) by mouth 2 (two) times daily.  Patient not taking: Reported on 04/19/2017 04/12/17   Benancio DeedsArmbruster, Steven P, MD  ranitidine (ZANTAC) 150 MG tablet Take 150 mg by mouth daily.    [provider]  rosuvastatin (CRESTOR) 5 MG tablet Take 5 mg by mouth daily.    [provider]    Family History Family History  Problem Relation Age of Onset  . Heart disease Mother   . Dementia Mother        deceased 6977  . Heart disease Father        PACEMAKER  . Cancer Father        penile cancer; currently 6483  . Breast cancer Sister 4338       currently 5656  . Breast cancer Maternal Grandmother 4465       deceased 6070s  . Diabetes Maternal Grandmother   . Esophageal cancer Maternal Uncle   . Colon cancer Neg Hx   . Colon polyps Neg Hx   . Rectal cancer Neg Hx   . Stomach cancer Neg  Hx     Social History Social History   Tobacco Use  . Smoking status: Former Smoker    Packs/day: 20.00    Types: Cigarettes    Last attempt to quit: 11/07/1991    Years since quitting: 25.9  . Smokeless tobacco: Never Used  Substance Use Topics  . Alcohol use: No    Alcohol/week: 0.0 oz  . Drug use: No     Allergies   Xarelto [rivaroxaban] and Pradaxa [dabigatran etexilate mesylate]   Review of Systems Review of Systems  Constitutional: Negative for activity change.       All ROS Neg except as noted in HPI  HENT: Negative for nosebleeds.   Eyes: Negative for photophobia and discharge.  Respiratory: Negative for cough, shortness of breath and wheezing.   Cardiovascular: Negative for chest pain and palpitations.  Gastrointestinal: Negative for abdominal pain and blood in stool.  Genitourinary: Negative for dysuria, frequency and hematuria.  Musculoskeletal: Negative for arthralgias, back pain and neck pain.       Leg pain  Skin: Negative.   Neurological: Negative for dizziness, seizures and speech difficulty.  Psychiatric/Behavioral: Negative for confusion and hallucinations.     Physical Exam Updated Vital Signs BP (!) 161/79   Pulse 70   Temp 98.3 F (36.8 C)   Resp 17   Ht 5\' 9"  (1.753 m)   Wt 85.7 kg (189 lb)   SpO2 94%   BMI 27.91 kg/m   Physical Exam  Constitutional: She is oriented to person, place, and time. She appears well-developed and well-nourished.  Non-toxic appearance.  HENT:  Head: Normocephalic.  Right Ear: Tympanic membrane and external ear normal.  Left Ear: Tympanic membrane and external ear normal.  Eyes: EOM and lids are normal. Pupils are equal, round, and reactive to light.  Neck: Normal range of motion. Neck supple. Carotid bruit is not present.  Cardiovascular: Normal rate, regular rhythm, normal heart sounds, intact distal pulses and normal pulses.  Pulmonary/Chest: Breath sounds normal. No respiratory distress.  Abdominal:  Soft. Bowel sounds are normal. There is no tenderness. There is no guarding.  Musculoskeletal: Normal range of motion.       Left upper leg: She exhibits tenderness. She exhibits no edema.       Legs: Lymphadenopathy:       Head (right side): No submandibular adenopathy present.       Head (left side): No submandibular adenopathy present.    She  has no cervical adenopathy.  Neurological: She is alert and oriented to person, place, and time. She has normal strength. No cranial nerve deficit or sensory deficit.  Skin: Skin is warm and dry.  Psychiatric: She has a normal mood and affect. Her speech is normal.  Nursing note and vitals reviewed.    ED Treatments / Results  Labs (all labs ordered are listed, but only abnormal results are displayed) Labs Reviewed  BASIC METABOLIC PANEL - Abnormal; Notable for the following components:      Result Value   Glucose, Bld 100 (*)    All other components within normal limits  MAGNESIUM    EKG  EKG Interpretation None       Radiology US Venous Img Lower Unilateral Left  Result Date: 10/01/2017 CLINICAL DATA:  Left leg pain and cramping for 2 days EXAM: LEFT LOWER EXTREMITY VENOUS DOPPLER ULTRASOUND TECHNIQUE: Gray-scale sonography with graded compression, as well as color Doppler and duplex ultrasound were performed to evaluate the lower extremity deep venous systems from the level of the common femoral vein and including the common femoral, femoral, profunda femoral, popliteal and calf veins including the posterior tibial, peroneal and gastrocnemius veins when visible. The superficial great saphenous vein was also interrogated. Spectral Doppler was utilized to evaluate flow at rest and with distal augmentation maneuvers in the common femoral, femoral and popliteal veins. COMPARISON:  None. FINDINGS: Contralateral Common Femoral Vein: Respiratory phasicity is normal and symmetric with the symptomatic side. No evidence of thrombus. Normal  compressibility. Common Femoral Vein: No evidence of thrombus. Normal compressibility, respiratory phasicity and response to augmentation. Saphenofemoral Junction: No evidence of thrombus. Normal compressibility and flow on color Doppler imaging. Profunda Femoral Vein: No evidence of thrombus. Normal compressibility and flow on color Doppler imaging. Femoral Vein: No evidence of thrombus. Normal compressibility, respiratory phasicity and response to augmentation. Popliteal Vein: No evidence of thrombus. Normal compressibility, respiratory phasicity and response to augmentation. Calf Veins: No evidence of thrombus. Normal compressibility and flow on color Doppler imaging. Superficial Great Saphenous Vein: No evidence of thrombus. Normal compressibility. Venous Reflux:  None. Other Findings:  None. IMPRESSION: No evidence of deep venous thrombosis. Electronically Signed   By: Judie Petit.  Shick M.D.   On: 10/01/2017 11:01    Procedures Procedures (including critical care time)  Medications Ordered in ED Medications - No data to display   Initial Impression / Assessment and Plan / ED Course  I have reviewed the triage vital signs and the nursing notes.  Pertinent labs & imaging results that were available during my care of the patient were reviewed by me and considered in my medical decision making (see chart for details).      Final Clinical Impressions(s) / ED Diagnoses  MDM Vital signs are within normal limits.  Ultrasound of the left lower extremity is negative for deep vein thrombus.  Basic metabolic panel is well within normal limits.  Magnesium is within normal limits.  No evidence for electrolyte imbalance causing cramping and pain.  No neurological or vascular compromise noted on examination.  I suspect the patient has a muscle strain in the groin area.  Patient will be treated with Flexeril.  Of asked her to use ibuprofen 400 mg 4 soreness and discomfort.  Patient is to follow-up with the  primary physician if any changes, problems, or difficulty with pain.   Final diagnoses:  Groin strain, left, initial encounter    ED Discharge Orders  Ordered    cyclobenzaprine (FLEXERIL) 10 MG tablet  3 times daily     10/01/17 1437    ibuprofen (ADVIL,MOTRIN) 400 MG tablet  Every 6 hours PRN     10/01/17 1437       Ivery Quale, PA-C 10/01/17 2009    Vanetta Mulders, MD 10/03/17 1615

## 2017-10-01 NOTE — Discharge Instructions (Signed)
The ultrasound of your lower extremity is negative for deep vein clots.  Your electrolytes and your magnesium are all within normal limits.  Your examination suggests muscle strain involving the groin muscles and quadricep muscles of your left lower extremity.  Please rest her leg is much as possible.  Use Flexeril 3 times daily for spasm pain.  Use ibuprofen with breakfast, lunch, dinner, and at bedtime.  Flexeril may cause drowsiness.  Please do not drive, drink alcohol, operate machinery, or handle legal documents while taking this medication.  Please see Dr.Le for additional evaluation and management if any changes or problems.

## 2017-10-01 NOTE — ED Triage Notes (Signed)
Pt c/o left upper leg pain and cramping x 2 days. Pt reports history of blood clots feeling similar. Pt is not currently on any anticoagulants other than 325mg  asa daily po.

## 2017-10-03 DIAGNOSIS — M2570 Osteophyte, unspecified joint: Secondary | ICD-10-CM | POA: Diagnosis not present

## 2017-10-03 DIAGNOSIS — M25775 Osteophyte, left foot: Secondary | ICD-10-CM | POA: Diagnosis not present

## 2017-10-03 DIAGNOSIS — M792 Neuralgia and neuritis, unspecified: Secondary | ICD-10-CM | POA: Diagnosis not present

## 2017-10-09 DIAGNOSIS — B351 Tinea unguium: Secondary | ICD-10-CM | POA: Diagnosis not present

## 2017-10-09 DIAGNOSIS — M25774 Osteophyte, right foot: Secondary | ICD-10-CM | POA: Diagnosis not present

## 2017-10-24 DIAGNOSIS — M25775 Osteophyte, left foot: Secondary | ICD-10-CM | POA: Diagnosis not present

## 2017-11-07 DIAGNOSIS — B351 Tinea unguium: Secondary | ICD-10-CM | POA: Diagnosis not present

## 2017-11-21 ENCOUNTER — Telehealth (INDEPENDENT_AMBULATORY_CARE_PROVIDER_SITE_OTHER): Payer: Self-pay | Admitting: Orthopaedic Surgery

## 2017-11-21 NOTE — Telephone Encounter (Signed)
Patient called checking on records request. I told her records were faxed on 1/2 and I received confirmation. She stated [place] telling her they do not have them. Records refaxed [phone]

## 2017-12-26 ENCOUNTER — Ambulatory Visit (INDEPENDENT_AMBULATORY_CARE_PROVIDER_SITE_OTHER): Payer: Federal, State, Local not specified - PPO | Admitting: Gastroenterology

## 2017-12-26 ENCOUNTER — Encounter: Payer: Self-pay | Admitting: Gastroenterology

## 2017-12-26 VITALS — BP 120/82 | HR 72 | Ht 69.0 in | Wt 193.6 lb

## 2017-12-26 DIAGNOSIS — R1310 Dysphagia, unspecified: Secondary | ICD-10-CM | POA: Diagnosis not present

## 2017-12-26 DIAGNOSIS — K219 Gastro-esophageal reflux disease without esophagitis: Secondary | ICD-10-CM

## 2017-12-26 MED ORDER — BACLOFEN 10 MG PO TABS
10.0000 mg | ORAL_TABLET | Freq: Every day | ORAL | 1 refills | Status: DC
Start: 2017-12-26 — End: 2018-02-21

## 2017-12-26 NOTE — Progress Notes (Signed)
HPI :  57 year old female with a history of severe GERD with associated peptic stricture, here for reassessment of these issues.  She has had multiple endoscopies over the past 2 years structure.  Eventually she was able to be dilated up to 18 mm diameter in October 2017 as well as June 2018.  She states these dilations have provided significant benefit for her dysphasia, however her symptoms have recurred over time.  She states otherwise her reflux symptoms pyrosis and regurgitation are severe and bothering her significantly.  She has been tried on omeprazole, Prevacid, AcipHex, Nexium in the past -she states she does not tolerate any PPIs due to a variety of side effects and ultimately makes her feel "sick".  In this light she has been taking Zantac 150 mg twice daily.  She states this does not work well at all to control her heartburn which bothers her significantly.  She sleeps with head of her bed elevated, often wakes up at night with regurgitation.  This is she is becoming very frustrating for her. I have tried her on Gaviscon and Maalox as needed help too much for her reflux symptoms.   Endoscopic history: EGD 04/19/2017 - 2cm HH, GEJ stenosis dilated to 18mm with good result, biopsies of stricture benign EGD 08/31/2016 - 2cm hiatal hernia, stricture dilated to 18mm EGD 07/26/16 - GEJ stricture dilated to 16mm EGD 06/30/16 - GEJ stricture dilated to 13mm EGD 01/11/16 - GEJ stricture dilated to 13mm EGD 12/20/15 - 7mm GEJ stircture, dilated to 10.245mm, biopsies benign, no EoE EGD 09/06/15 - 7-218mm GEJ stricture, dilated with endoscope, biopsies benign Colonoscopy in 2013 or so, she thinks it was normal. No records of this available. No FH of CRC   Past Medical History:  Diagnosis Date  . Allergy    seasonal  . Anticoagulated on warfarin    off since 2015  . Arthritis    ankle- left   . Asthma    no problems in years, rarely uses inhaler  . Clotting disorder (HCC)    DVT,PE  . DVT  (deep venous thrombosis) (HCC)    left leg in 2014  . Family history of breast cancer   . GERD (gastroesophageal reflux disease)   . Heart murmur    pt denies  . Hyperlipidemia   . PE (pulmonary embolism) 04/2014  . Uses hearing aid    bilateral  . Wears partial dentures    upper dentures and lower partial     Past Surgical History:  Procedure Laterality Date  . ABDOMINAL HYSTERECTOMY    . APPENDECTOMY    . COLONOSCOPY    . ESOPHAGOGASTRODUODENOSCOPY (EGD) WITH ESOPHAGEAL DILATION     x3  . FOOT SURGERY Bilateral    BONE SPURS  . KNEE SURGERY Right   . OPEN REDUCTION INTERNAL FIXATION (ORIF) DISTAL RADIAL FRACTURE Right 12/02/2014   Procedure: OPEN REDUCTION INTERNAL FIXATION (ORIF) DISTAL RADIAL FRACTURE;  Surgeon: Eldred MangesMark C Yates, MD;  Location: [facility] OR;  Service: Orthopedics;  Laterality: Right;  . UPPER GASTROINTESTINAL ENDOSCOPY     Family History  Problem Relation Age of Onset  . Heart disease Mother   . Dementia Mother        deceased 2777  . Heart disease Father        PACEMAKER  . Cancer Father        penile cancer; currently 4583  . Breast cancer Sister 6038       currently 5856  . Breast cancer  Maternal Grandmother 58       deceased 44s  . Diabetes Maternal Grandmother   . Esophageal cancer Maternal Uncle   . Colon cancer Neg Hx   . Colon polyps Neg Hx   . Rectal cancer Neg Hx   . Stomach cancer Neg Hx    Social History   Tobacco Use  . Smoking status: Former Smoker    Packs/day: 20.00    Types: Cigarettes    Last attempt to quit: 11/07/1991    Years since quitting: 26.1  . Smokeless tobacco: Never Used  Substance Use Topics  . Alcohol use: No    Alcohol/week: 0.0 oz  . Drug use: No   Current Outpatient Medications  Medication Sig Dispense Refill  . ibuprofen (ADVIL,MOTRIN) 400 MG tablet Take 1 tablet (400 mg total) by mouth every 6 (six) hours as needed. 30 tablet 0  . ranitidine (ZANTAC) 150 MG tablet Take 150 mg by mouth daily.    . rosuvastatin  (CRESTOR) 5 MG tablet Take 5 mg by mouth daily.     Current Facility-Administered Medications  Medication Dose Route Frequency Provider Last Rate Last Dose  . 0.9 %  sodium chloride infusion  500 mL Intravenous Continuous Arnulfo Batson, Willaim Rayas, MD      . 0.9 %  sodium chloride infusion  500 mL Intravenous Continuous Meredith Kilbride, Willaim Rayas, MD      . 0.9 %  sodium chloride infusion  500 mL Intravenous Continuous Garlin Batdorf, Willaim Rayas, MD       Allergies  Allergen Reactions  . Xarelto [Rivaroxaban] Other (See Comments)    nausea  . Pradaxa [Dabigatran Etexilate Mesylate] Nausea Only     Review of Systems: All systems reviewed and negative except where noted in HPI.   Lab Results  Component Value Date   WBC 9.0 12/02/2014   HGB 13.4 12/02/2014   HCT 40.4 12/02/2014   MCV 91.6 12/02/2014   PLT 281 12/02/2014    Lab Results  Component Value Date   CREATININE 0.82 10/01/2017   BUN 7 10/01/2017   NA 138 10/01/2017   K 4.1 10/01/2017   CL 103 10/01/2017   CO2 25 10/01/2017    Lab Results  Component Value Date   ALT 19 12/02/2014   AST 21 12/02/2014   ALKPHOS 91 12/02/2014   BILITOT 0.5 12/02/2014     Physical Exam: BP 120/82   Pulse 72   Ht 5\' 9"  (1.753 m)   Wt 193 lb 9.6 oz (87.8 kg)   BMI 28.59 kg/m  Constitutional: Pleasant,well-developed, female in no acute distress. HEENT: Normocephalic and atraumatic. Conjunctivae are normal. No scleral icterus. Neck supple.  Cardiovascular: Normal rate, regular rhythm.  Pulmonary/chest: Effort normal and breath sounds normal. No wheezing, rales or rhonchi. Abdominal: Soft, nondistended, nontender. There are no masses palpable. No hepatomegaly. Extremities: no edema Lymphadenopathy: No cervical adenopathy noted. Neurological: Alert and oriented to person place and time. Skin: Skin is warm and dry. No rashes noted. Psychiatric: Normal mood and affect. Behavior is normal.   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  She is intolerant of PPIs, and refuses to try them again in the future.  In this light her reflux is very poorly controlled on Zantac monotherapy, and suspect her poorly controlled reflux is causing her stricture to recur. She declined Gaviscon / Maalon. In light of her intolerance of PPI I think she may be best  served by surgical therapy / Nissen fundoplication.  We discussed this for a bit and she is agreeable to a surgical consultation.  Prior to surgical consultation she will need esophageal manometry to rule out any dysmotility and would also perform pH test.  She was referred for this today.  Otherwise we will schedule her for another EGD with dilation to help her dysphasia in the interim.  Regarding other medical therapy for reflux, she has a lot of nocturnal symptoms, I offered her a trial of baclofen 10-20mg  qHS for reflux given her intolerance of PPIs.  If this works for her and she tolerates that she can also use it during the daytime, but I counseled her on risk of somnolence with this regimen.   Following discussion of risks and benefits of EGD and dilation she wanted to proceed.  Further recommendations pending this result and that of her pH manometry test.  Ileene Patrick, MD [facility] Pager [phone]

## 2017-12-26 NOTE — Patient Instructions (Addendum)
If you are age 58 or older, your body mass index should be between 23-30. Your Body mass index is 28.59 kg/m. If this is out of the aforementioned range listed, please consider follow up with your Primary Care Provider.  If you are age 58 or younger, your body mass index should be between 19-25. Your Body mass index is 28.59 kg/m. If this is out of the aformentioned range listed, please consider follow up with your Primary Care Provider.   You have been scheduled for an endoscopy. Please follow written instructions given to you at your visit today. If you use inhalers (even only as needed), please bring them with you on the day of your procedure. Your physician has requested that you go to www.startemmi.com and enter the access code given to you at your visit today. This web site gives a general overview about your procedure. However, you should still follow specific instructions given to you by our office regarding your preparation for the procedure.  You have been scheduled for a Esophageal Manometry and 24 Hour PH Probe test at [facility] on Wednesday, 01-02-18 at 10:30am. Please arrive 30 minutes prior to your procedure for registration. You will need to go to outpatient registration (1st floor of the hospital) first. Make certain to bring your insurance cards as well as a complete list of medications.  Please remember the following:  1) Do not take any muscle relaxants, xanax (alprazolam) or ativan for 1 day prior to your     test as well as the day of the test.  2) Nothing to eat or drink after 12:00 midnight on the night before your test.  3) Hold all diabetic medications/insulin the morning of the test. You may eat and take             your medications after the test.  4) Continue taking your Zantac.  5) For 7 days prior to your test, do not take: Reglan, Tagamet, Zantac, Axid or Pepcid.  6) You MAY use an antacid such as Rolaids or Tums up to 12 hours prior to your  test.  It will take at least 2 weeks to receive the results of this test from your physician. ------------------------------------------ ABOUT 24 HOUR PH PROBE An esophageal pH test measures and records the pH in your esophagus to determine if you have gastroesophageal reflux disease (GERD). The test can also be done to determine the effectiveness of medications or surgical treatment for GERD. What is esophageal reflux? Esophageal reflux is a condition in which stomach acid refluxes or moves back into the esophagus (the "food pipe" leading from the mouth to the stomach). How does the esophageal pH test work? A thin, small tube with an acid sensing device on the tip is gently passed through your nose, down the esophagus ("food tube"), and positioned about 2 inches above the lower esophageal sphincter. The tube is secured to the side of your face with clear tape. The end of the tube exiting from your nose is attached to a portable recorder that is worn on your belt or over your shoulder. The recorder has several buttons on it that you will press to mark certain events. A nurse will review the monitoring instructions with you. Once the test has begun, what do I need to know and do? Activity: Follow your usual daily routine. Do not reduce or change your activities during the monitoring period. Doing so can make the monitoring results less useful.  Note: do not  take a tub bath or shower; the equipment can't get wet.  Eating: Eat your regular meals at the usual times. If you do not eat during the monitoring period, your stomach will not produce acid as usual, and the test results will not be accurate. Eat at least 2 meals a day. Eat foods that tend to increase your symptoms (without making yourself miserable). Avoid snacking. Do not suck on hard candy or lozenges and do not chew gum during the monitoring period.  Lying down: Remain upright throughout the day. Do not lie down until you go to bed (unless  napping or lying down during the day is part of your daily routine).  Medications: Continue to follow your doctor's advice regarding medications to avoid during the monitoring period.  Recording symptoms: Press the appropriate button on your recorder when symptoms occur (as discussed with the nurse).  Recording events: Record the time you start and stop eating and drinking (anything other than plain water). Record the time you lie down (even if just resting) and when you get back up. The nurse will explain this.  Unusual symptoms or side effects. If you think you may be experiencing any unusual symptoms or side effects, call your doctor.  You will return the next day to have the tube removed. The information on the recorder will be downloaded to a computer and the results will be analyzed.  After completion of the study Resume your normal diet and medications. Lozenges or hard candy may help ease any sore throat caused by the tube.    ------------------------------------------ ABOUT ESOPHAGEAL MANOMETRY Esophageal manometry (muh-NOM-uh-tree) is a test that gauges how well your esophagus works. Your esophagus is the long, muscular tube that connects your throat to your stomach. Esophageal manometry measures the rhythmic muscle contractions (peristalsis) that occur in your esophagus when you swallow. Esophageal manometry also measures the coordination and force exerted by the muscles of your esophagus.  During esophageal manometry, a thin, flexible tube (catheter) that contains sensors is passed through your nose, down your esophagus and into your stomach. Esophageal manometry can be helpful in diagnosing some mostly uncommon disorders that affect your esophagus.  Why it's done Esophageal manometry is used to evaluate the movement (motility) of food through the esophagus and into the stomach. The test measures how well the circular bands of muscle (sphincters) at the top and bottom of your esophagus  open and close, as well as the pressure, strength and pattern of the wave of esophageal muscle contractions that moves food along.  What you can expect Esophageal manometry is an outpatient procedure done without sedation. Most people tolerate it well. You may be asked to change into a hospital gown before the test starts.  During esophageal manometry  While you are sitting up, a member of your health care team sprays your throat with a numbing medication or puts numbing gel in your nose or both.  A catheter is guided through your nose into your esophagus. The catheter may be sheathed in a water-filled sleeve. It doesn't interfere with your breathing. However, your eyes may water, and you may gag. You may have a slight nosebleed from irritation.  After the catheter is in place, you may be asked to lie on your back on an exam table, or you may be asked to remain seated.  You then swallow small sips of water. As you do, a computer connected to the catheter records the pressure, strength and pattern of your esophageal muscle contractions.  During the test, you'll be asked to breathe slowly and smoothly, remain as still as possible, and swallow only when you're asked to do so.  A member of your health care team may move the catheter down into your stomach while the catheter continues its measurements.  The catheter then is slowly withdrawn. The test usually lasts 20 to 30 minutes.  After esophageal manometry  When your esophageal manometry is complete, you may return to your normal activities  This test typically takes 30-45 minutes to complete. _____________________________________________________________________________    We have sent the following medications to your pharmacy for you to pick up at your convenience: Baclofen 10mg , take every evening at bedtime   Thank you for entrusting me with your care and for choosing [facility], Dr. Ileene Patrick

## 2017-12-31 ENCOUNTER — Other Ambulatory Visit: Payer: Self-pay

## 2017-12-31 ENCOUNTER — Encounter: Payer: Self-pay | Admitting: Gastroenterology

## 2017-12-31 ENCOUNTER — Ambulatory Visit (AMBULATORY_SURGERY_CENTER): Payer: Federal, State, Local not specified - PPO | Admitting: Gastroenterology

## 2017-12-31 VITALS — BP 115/68 | HR 67 | Temp 98.6°F | Resp 13 | Ht 69.0 in | Wt 193.0 lb

## 2017-12-31 DIAGNOSIS — K219 Gastro-esophageal reflux disease without esophagitis: Secondary | ICD-10-CM | POA: Diagnosis not present

## 2017-12-31 DIAGNOSIS — R1310 Dysphagia, unspecified: Secondary | ICD-10-CM | POA: Diagnosis not present

## 2017-12-31 MED ORDER — SODIUM CHLORIDE 0.9 % IV SOLN: 500.0000 mL | Freq: Once | INTRAVENOUS | Status: DC

## 2017-12-31 NOTE — Patient Instructions (Signed)
   Follow dilatation diet given to you today  Information on GERD and hiatal hernia given to you today   Continue with Esophageal Manometry already scheduled   YOU HAD AN ENDOSCOPIC PROCEDURE TODAY AT THE [facility]:   Refer to the procedure report that was given to you for any specific questions about what was found during the examination.  If the procedure report does not answer your questions, please call your gastroenterologist to clarify.  If you requested that your care partner not be given the details of your procedure findings, then the procedure report has been included in a sealed envelope for you to review at your convenience later.  YOU SHOULD EXPECT: Some feelings of bloating in the abdomen. Passage of more gas than usual.  Walking can help get rid of the air that was put into your GI tract during the procedure and reduce the bloating. If you had a lower endoscopy (such as a colonoscopy or flexible sigmoidoscopy) you may notice spotting of blood in your stool or on the toilet paper. If you underwent a bowel prep for your procedure, you may not have a normal bowel movement for a few days.  Please Note:  You might notice some irritation and congestion in your nose or some drainage.  This is from the oxygen used during your procedure.  There is no need for concern and it should clear up in a day or so.  SYMPTOMS TO REPORT IMMEDIATELY:    Following upper endoscopy (EGD)  Vomiting of blood or coffee ground material  New chest pain or pain under the shoulder blades  Painful or persistently difficult swallowing  New shortness of breath  Fever of 100F or higher  Black, tarry-looking stools  For urgent or emergent issues, a gastroenterologist can be reached at any hour by calling [phone].   DIET:  We do recommend a small meal at first, but then you may proceed to your regular diet.  Drink plenty of fluids but you should avoid alcoholic beverages for 24  hours.  ACTIVITY:  You should plan to take it easy for the rest of today and you should NOT DRIVE or use heavy machinery until tomorrow (because of the sedation medicines used during the test).    FOLLOW UP: Our staff will call the number listed on your records the next business day following your procedure to check on you and address any questions or concerns that you may have regarding the information given to you following your procedure. If we do not reach you, we will leave a message.  However, if you are feeling well and you are not experiencing any problems, there is no need to return our call.  We will assume that you have returned to your regular daily activities without incident.  If any biopsies were taken you will be contacted by phone or by letter within the next 1-3 weeks.  Please call us at [phone] if you have not heard about the biopsies in 3 weeks.    SIGNATURES/CONFIDENTIALITY: You and/or your care partner have signed paperwork which will be entered into your electronic medical record.  These signatures attest to the fact that that the information above on your After Visit Summary has been reviewed and is understood.  Full responsibility of the confidentiality of this discharge information lies with you and/or your care-partner.

## 2017-12-31 NOTE — Progress Notes (Signed)
Pt's states no medical or surgical changes since previsit or office visit. 

## 2017-12-31 NOTE — Op Note (Signed)
[facility] Patient Name: Whitney Peters Procedure Date: 12/31/2017 1:19 PM MRN: [id number] Endoscopist: Viviann Spare P. Armbruster MD, MD Age: 57 Referring MD:  Date of Birth: 02-20-1961 Gender: Female Account #: [id number] Procedure:                Upper GI endoscopy Indications:              Dysphagia - history of peptic stricture with                            benefit following dilation in the past, ongoing                            reflux symptoms, intolerant of PPIs Medicines:                Monitored Anesthesia Care Procedure:                Pre-Anesthesia Assessment:                           - Prior to the procedure, a History and Physical                            was performed, and patient medications and                            allergies were reviewed. The patient's tolerance of                            previous anesthesia was also reviewed. The risks                            and benefits of the procedure and the sedation                            options and risks were discussed with the patient.                            All questions were answered, and informed consent                            was obtained. Prior Anticoagulants: The patient has                            taken no previous anticoagulant or antiplatelet                            agents. ASA Grade Assessment: II - A patient with                            mild systemic disease. After reviewing the risks                            and benefits, the patient was deemed in  satisfactory condition to undergo the procedure.                           After obtaining informed consent, the endoscope was                            passed under direct vision. Throughout the                            procedure, the patient's blood pressure, pulse, and                            oxygen saturations were monitored continuously. The                            Endoscope was  introduced through the mouth, and                            advanced to the second part of duodenum. The upper                            GI endoscopy was accomplished without difficulty.                            The patient tolerated the procedure well. Scope In: Scope Out: Findings:                 Esophagogastric landmarks were identified: the                            Z-line was found at 37 cm, the gastroesophageal                            junction was found at 37 cm and the upper extent of                            the gastric folds was found at 39 cm from the                            incisors.                           A 2 cm hiatal hernia was present.                           One moderate benign-appearing, intrinsic stenosis                            was found 37 cm from the incisors at the GEJ. This                            measured less than one cm (in length). A TTS  dilator was passed through the scope. Dilation with                            a 16-17-18 mm balloon dilator was performed to 16                            mm and 17 mm, with an appropriate mucosal wrent.                           The exam of the esophagus was otherwise normal.                           The entire examined stomach was normal.                           The duodenal bulb and second portion of the                            duodenum were normal. Complications:            No immediate complications. Estimated blood loss:                            Minimal. Estimated Blood Loss:     Estimated blood loss was minimal. Impression:               - Esophagogastric landmarks identified.                           - 2 cm hiatal hernia.                           - Benign-appearing esophageal stenosis. Dilated to                            17mm with a good result.                           - Normal stomach.                           - Normal duodenal bulb and second portion  of the                            duodenum. Recommendation:           - Patient has a contact number available for                            emergencies. The signs and symptoms of potential                            delayed complications were discussed with the                            patient. Return to normal activities tomorrow.  Written discharge instructions were provided to the                            patient.                           - Post-dilation diet                           - Continue present medications.                           - Await results of esophageal manometry / 24 HR pH                            impedance testing to assess candidacy for reflux                            surgery given intolerance to PPIs and recurrent                            peptic stricture. Viviann Spare P. Armbruster MD, MD 12/31/2017 1:35:56 PM This report has been signed electronically.

## 2017-12-31 NOTE — Progress Notes (Signed)
Called to room to assist during endoscopic procedure.  Patient ID and intended procedure confirmed with present staff. Received instructions for my participation in the procedure from the performing physician.  

## 2017-12-31 NOTE — Progress Notes (Signed)
Report given to PACU, vss 

## 2018-01-01 ENCOUNTER — Telehealth: Payer: Self-pay

## 2018-01-01 NOTE — Telephone Encounter (Signed)
  Follow up Call-  Call back number 12/31/2017 04/19/2017 08/31/2016 07/26/2016 06/30/2016 01/11/2016 12/20/2015  Post procedure Call Back phone  # [phone]  Permission to leave phone message Yes Yes Yes Yes Yes Yes Yes  Some recent data might be hidden     Patient questions:  Do you have a fever, pain , or abdominal swelling? No. Pain Score  0 *  Have you tolerated food without any problems? Yes.    Have you been able to return to your normal activities? Yes.    Do you have any questions about your discharge instructions: Diet   No. Medications  No. Follow up visit  No.  Do you have questions or concerns about your Care? No.  Actions: * If pain score is 4 or above: No action needed, pain <4.

## 2018-01-02 ENCOUNTER — Encounter (HOSPITAL_COMMUNITY)
Admission: RE | Disposition: A | Discharge status: Home / Self Care | Payer: Self-pay | Source: Ambulatory Visit | Attending: Gastroenterology

## 2018-01-02 ENCOUNTER — Ambulatory Visit (HOSPITAL_COMMUNITY)
Admission: RE | Admit: 2018-01-02 | Discharge: 2018-01-02 | Disposition: A | Discharge status: Home / Self Care | Payer: Federal, State, Local not specified - PPO | Source: Ambulatory Visit | Attending: Gastroenterology | Admitting: Gastroenterology

## 2018-01-02 DIAGNOSIS — Z01818 Encounter for other preprocedural examination: Secondary | ICD-10-CM | POA: Diagnosis not present

## 2018-01-02 DIAGNOSIS — R1310 Dysphagia, unspecified: Secondary | ICD-10-CM | POA: Diagnosis not present

## 2018-01-02 DIAGNOSIS — K219 Gastro-esophageal reflux disease without esophagitis: Secondary | ICD-10-CM | POA: Diagnosis not present

## 2018-01-02 HISTORY — PX: 24 HOUR PH STUDY: SHX5419

## 2018-01-02 HISTORY — PX: ESOPHAGEAL MANOMETRY: SHX5429

## 2018-01-02 SURGERY — MANOMETRY, ESOPHAGUS

## 2018-01-02 MED ORDER — LIDOCAINE VISCOUS 2 % MT SOLN
OROMUCOSAL | Status: AC
  Filled 2018-01-02: qty 15

## 2018-01-02 SURGICAL SUPPLY — 2 items
FACESHIELD LNG OPTICON STERILE (SAFETY) IMPLANT
GLOVE BIO SURGEON STRL SZ8 (GLOVE) ×4 IMPLANT

## 2018-01-02 NOTE — Progress Notes (Signed)
Esophageal Manometry done per protocol.  Pt tolerated well without complication.  PH with impedance probe place at 35.5cm without difficulty. Taped and secured to nare. Pt instructed using teach back regarding study and monitor. Pt voiced understanding. Pt to return to unit to have probe removed and monitor downloaded tomorrow 1130ma or after. Reports to be sent to Dr. Lavon PaganiniNandigam.

## 2018-01-03 ENCOUNTER — Encounter (HOSPITAL_COMMUNITY): Payer: Self-pay | Admitting: Gastroenterology

## 2018-01-09 ENCOUNTER — Telehealth: Payer: Self-pay

## 2018-01-09 NOTE — Telephone Encounter (Signed)
Rec'd confirmation from [facility] pt has appt with Dr. Derrell Lollingamirez on 01-17-18.

## 2018-01-12 DIAGNOSIS — M79641 Pain in right hand: Secondary | ICD-10-CM | POA: Diagnosis not present

## 2018-01-17 ENCOUNTER — Ambulatory Visit: Payer: Self-pay | Admitting: General Surgery

## 2018-01-17 DIAGNOSIS — K219 Gastro-esophageal reflux disease without esophagitis: Secondary | ICD-10-CM | POA: Diagnosis not present

## 2018-01-17 DIAGNOSIS — K449 Diaphragmatic hernia without obstruction or gangrene: Secondary | ICD-10-CM | POA: Diagnosis not present

## 2018-01-17 DIAGNOSIS — Z86711 Personal history of pulmonary embolism: Secondary | ICD-10-CM | POA: Diagnosis not present

## 2018-01-17 NOTE — H&P (Signed)
History of Present Illness Axel Filler MD; 01/17/2018 9:53 AM) The patient is a 57 year old female who presents with a hiatal hernia. Referred by: Dr. Ileene Patrick Chief Complaint: GERD Patient is a 57 year old female with a history of severe reflux, previous DVT in the past, who comes in with a significant history of reflux. Patient states that this reflux or possibly a 14-15 years ago. She states that she's been treated with multiple reflux medications however continues with reflux at this time. 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 She states that she's had 6 dilations in the last 2 years.  Patient states that she does have coughing, reflux at night, sleeps on multiple pillows to help with reflux. She endorses burning, water brash, regurgitating undigested food.  Patient underwent manometry study as well as pH probe study which revealed a DeMeester score of 22.9 and normal relaxation of the EG junction with no significant esophageal peristaltic abnormality. I did review these studies personally.  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 She was. She worked up by hematology and there was no organic cause. Patient was on blood thinners at that time however currently is not on any blood thinners.   Allergies (Tanisha A. Manson Passey, RMA; 01/17/2018 9:27 AM) No Known Drug Allergies [01/17/2018]: Allergies Reconciled   Medication History (Tanisha A. Manson Passey, RMA; 01/17/2018 9:27 AM) Baclofen (10MG  Tablet, Oral) Active. RaNITidine HCl (150MG  Tablet, Oral) Active. Medications Reconciled    Review of Systems Axel Filler, MD; 01/17/2018 9:57 AM) General Not Present- Appetite Loss, Chills, Fatigue, Fever, Night Sweats, Weight Gain and Weight Loss. Skin Not Present- Change in Wart/Mole, Dryness, Hives, Jaundice, New Lesions, Non-Healing Wounds, Rash and Ulcer. HEENT Present- Wears glasses/contact lenses. Not Present- Earache, Hearing  Loss, Hoarseness, Nose Bleed, Oral Ulcers, Ringing in the Ears, Seasonal Allergies, Sinus Pain, Sore Throat, Visual Disturbances and Yellow Eyes. Breast Not Present- Breast Mass, Breast Pain, Nipple Discharge and Skin Changes. Cardiovascular Not Present- Chest Pain, Difficulty Breathing Lying Down, Leg Cramps, Palpitations, Rapid Heart Rate, Shortness of Breath and Swelling of Extremities. Gastrointestinal Not Present- Abdominal Pain, Bloating, Bloody Stool, Change in Bowel Habits, Chronic diarrhea, Constipation, Difficulty Swallowing, Excessive gas, Gets full quickly at meals, Hemorrhoids, Indigestion, Nausea, Rectal Pain and Vomiting. Musculoskeletal Not Present- Back Pain, Joint Pain, Joint Stiffness, Muscle Pain, Muscle Weakness and Swelling of Extremities. Neurological Not Present- Decreased Memory, Fainting, Headaches, Numbness, Seizures, Tingling, Tremor, Trouble walking and Weakness. Psychiatric Not Present- Anxiety, Bipolar, Change in Sleep Pattern, Depression, Fearful and Frequent crying. Endocrine Not Present- Cold Intolerance, Excessive Hunger, Hair Changes, Heat Intolerance and New Diabetes. Hematology Not Present- Blood Thinners, Easy Bruising, Excessive bleeding, Gland problems, HIV and Persistent Infections. All other systems negative  Vitals (Tanisha A. Brown RMA; 01/17/2018 9:27 AM) 01/17/2018 9:26 AM Weight: 193.6 lb Height: 69in Body Surface Area: 2.04 m Body Mass Index: 28.59 kg/m  Temp.: 98.64F  Pulse: 94 (Regular)  BP: 118/74 (Sitting, Left Arm, Standard)       Physical Exam Axel Filler MD; 01/17/2018 9:54 AM) The physical exam findings are as follows: Note:Constitutional: No acute distress, conversant, appears stated age  Eyes: Anicteric sclerae, moist conjunctiva, no lid lag  Neck: No thyromegaly, trachea midline, no cervical lymphadenopathy  Lungs: Clear to auscultation biilaterally, normal respiratory effot  Cardiovascular: regular rate &  rhythm, no murmurs, no peripheal edema, pedal pulses 2+  GI: Soft, no masses or hepatosplenomegaly, non-tender to palpation  MSK: Normal gait, no clubbing cyanosis,  edema  Skin: No rashes, palpation reveals normal skin turgor  Psychiatric: Appropriate judgment and insight, oriented to person, place, and time    Assessment & Plan Axel Filler(Angeles Zehner MD; 01/17/2018 9:56 AM) GASTRIC REFLUX (K21.9) Impression: 57 year old female with a history of severe GERD, and small hiatal hernia  1. Will proceed to the operating room for a robotic hiatal hernia repair with mesh and Nissen fundoplication  2. Discussed with patient the risks and benefits of the procedure to include but not limited to: Infection, bleeding, damage to structures, possible pneumothorax, possible recurrence. The patient voiced understanding and wishes to proceed. 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

## 2018-01-18 ENCOUNTER — Other Ambulatory Visit: Payer: Self-pay | Admitting: General Surgery

## 2018-01-18 DIAGNOSIS — K449 Diaphragmatic hernia without obstruction or gangrene: Secondary | ICD-10-CM

## 2018-01-23 ENCOUNTER — Ambulatory Visit
Admission: RE | Admit: 2018-01-23 | Discharge: 2018-01-23 | Disposition: A | Discharge status: Home / Self Care | Payer: Federal, State, Local not specified - PPO | Source: Ambulatory Visit | Attending: General Surgery | Admitting: General Surgery

## 2018-01-23 DIAGNOSIS — K449 Diaphragmatic hernia without obstruction or gangrene: Secondary | ICD-10-CM

## 2018-02-27 NOTE — Patient Instructions (Signed)
Margaretha GlassingLoretta Caselli  02/27/2018   Your procedure is scheduled on: 03-06-18   Report to [facility] Main  Entrance    Report to admitting at 8:30AM    Call this number if you have problems the morning of surgery [phone]     Remember: Do not eat food or drink liquids :After Midnight.     Take these medicines the morning of surgery with A SIP OF WATER: ranitidine if needed                                You may not have any metal on your body including hair pins and              piercings  Do not wear jewelry, make-up, lotions, powders or perfumes, deodorant             Do not wear nail polish.  Do not shave  48 hours prior to surgery.              Do not bring valuables to the hospital. [facility] IS NOT             RESPONSIBLE   FOR VALUABLES.  Contacts, dentures or bridgework may not be worn into surgery.  Leave suitcase in the car. After surgery it may be brought to your room.                Please read over the following fact sheets you were given: _____________________________________________________________________             [facility] Pre[facility] Before surgery, you can play an important role.  Because skin is not sterile, your skin needs to be as free of germs as possible.  You can reduce the number of germs on your skin by washing with CHG (chlorahexidine gluconate) soap before surgery.  CHG is an antiseptic cleaner which kills germs and bonds with the skin to continue killing germs even after washing. Please DO NOT use if you have an allergy to CHG or antibacterial soaps.  If your skin becomes reddened/irritated stop using the CHG and inform your nurse when you arrive at Short Stay. Do not shave (including legs and underarms) for at least 48 hours prior to the first CHG shower.  You may shave your face/neck. Please follow these instructions carefully:  1.  Shower with CHG Soap the night before surgery and the  morning of  Surgery.  2.  If you choose to wash your hair, wash your hair first as usual with your  normal  shampoo.  3.  After you shampoo, rinse your hair and body thoroughly to remove the  shampoo.                           4.  Use CHG as you would any other liquid soap.  You can apply chg directly  to the skin and wash                       Gently with a scrungie or clean washcloth.  5.  Apply the CHG Soap to your body ONLY FROM THE NECK DOWN.   Do not use on face/ open  Wound or open sores. Avoid contact with eyes, ears mouth and genitals (private parts).                       Wash face,  Genitals (private parts) with your normal soap.             6.  Wash thoroughly, paying special attention to the area where your surgery  will be performed.  7.  Thoroughly rinse your body with warm water from the neck down.  8.  DO NOT shower/wash with your normal soap after using and rinsing off  the CHG Soap.                9.  Pat yourself dry with a clean towel.            10.  Wear clean pajamas.            11.  Place clean sheets on your bed the night of your first shower and do not  sleep with pets. Day of Surgery : Do not apply any lotions/deodorants the morning of surgery.  Please wear clean clothes to the hospital/surgery center.  FAILURE TO FOLLOW THESE INSTRUCTIONS MAY RESULT IN THE CANCELLATION OF YOUR SURGERY PATIENT SIGNATURE_________________________________  NURSE SIGNATURE__________________________________  ________________________________________________________________________

## 2018-02-28 ENCOUNTER — Other Ambulatory Visit: Payer: Self-pay

## 2018-02-28 ENCOUNTER — Encounter (HOSPITAL_COMMUNITY): Payer: Self-pay

## 2018-02-28 ENCOUNTER — Encounter (HOSPITAL_COMMUNITY)
Admission: RE | Admit: 2018-02-28 | Discharge: 2018-02-28 | Disposition: A | Discharge status: Home / Self Care | Payer: Federal, State, Local not specified - PPO | Source: Ambulatory Visit | Attending: General Surgery | Admitting: General Surgery

## 2018-02-28 DIAGNOSIS — Z01812 Encounter for preprocedural laboratory examination: Secondary | ICD-10-CM | POA: Insufficient documentation

## 2018-02-28 LAB — CBC
HEMATOCRIT: 40.8 % (ref 36.0–46.0)
Hemoglobin: 13.6 g/dL (ref 12.0–15.0)
MCH: 30.8 pg (ref 26.0–34.0)
MCHC: 33.3 g/dL (ref 30.0–36.0)
MCV: 92.5 fL (ref 78.0–100.0)
Platelets: 323 10*3/uL (ref 150–400)
RBC: 4.41 MIL/uL (ref 3.87–5.11)
RDW: 13.3 % (ref 11.5–15.5)
WBC: 7.6 10*3/uL (ref 4.0–10.5)

## 2018-02-28 LAB — ABO/RH: ABO/RH(D): B POS

## 2018-02-28 LAB — BASIC METABOLIC PANEL
Anion gap: 9 (ref 5–15)
BUN: 7 mg/dL (ref 6–20)
CO2: 22 mmol/L (ref 22–32)
Calcium: 9.1 mg/dL (ref 8.9–10.3)
Chloride: 107 mmol/L (ref 101–111)
Creatinine, Ser: 0.98 mg/dL (ref 0.44–1.00)
GFR calc non Af Amer: >60 mL/min (ref >60)
Glucose, Bld: 90 mg/dL (ref 65–99)
POTASSIUM: 4 mmol/L (ref 3.5–5.1)
Sodium: 138 mmol/L (ref 135–145)

## 2018-03-06 ENCOUNTER — Other Ambulatory Visit: Payer: Self-pay

## 2018-03-06 ENCOUNTER — Ambulatory Visit (HOSPITAL_COMMUNITY): Payer: Federal, State, Local not specified - PPO | Admitting: Anesthesiology

## 2018-03-06 ENCOUNTER — Encounter (HOSPITAL_COMMUNITY): Payer: Self-pay

## 2018-03-06 ENCOUNTER — Encounter (HOSPITAL_COMMUNITY)
Admission: RE | Disposition: A | Discharge status: Home / Self Care | Payer: Self-pay | Source: Ambulatory Visit | Attending: General Surgery

## 2018-03-06 ENCOUNTER — Observation Stay (HOSPITAL_COMMUNITY)
Admission: RE | Admit: 2018-03-06 | Discharge: 2018-03-08 | Disposition: A | Discharge status: Home / Self Care | Payer: Federal, State, Local not specified - PPO | Source: Ambulatory Visit | Attending: General Surgery | Admitting: General Surgery

## 2018-03-06 DIAGNOSIS — Z86718 Personal history of other venous thrombosis and embolism: Secondary | ICD-10-CM | POA: Insufficient documentation

## 2018-03-06 DIAGNOSIS — M1990 Unspecified osteoarthritis, unspecified site: Secondary | ICD-10-CM | POA: Insufficient documentation

## 2018-03-06 DIAGNOSIS — J45909 Unspecified asthma, uncomplicated: Secondary | ICD-10-CM | POA: Insufficient documentation

## 2018-03-06 DIAGNOSIS — K228 Other specified diseases of esophagus: Secondary | ICD-10-CM | POA: Diagnosis not present

## 2018-03-06 DIAGNOSIS — I739 Peripheral vascular disease, unspecified: Secondary | ICD-10-CM | POA: Insufficient documentation

## 2018-03-06 DIAGNOSIS — K449 Diaphragmatic hernia without obstruction or gangrene: Secondary | ICD-10-CM | POA: Diagnosis not present

## 2018-03-06 DIAGNOSIS — Z86711 Personal history of pulmonary embolism: Secondary | ICD-10-CM | POA: Diagnosis not present

## 2018-03-06 DIAGNOSIS — Z87891 Personal history of nicotine dependence: Secondary | ICD-10-CM | POA: Insufficient documentation

## 2018-03-06 DIAGNOSIS — K219 Gastro-esophageal reflux disease without esophagitis: Principal | ICD-10-CM | POA: Insufficient documentation

## 2018-03-06 DIAGNOSIS — K222 Esophageal obstruction: Secondary | ICD-10-CM | POA: Diagnosis not present

## 2018-03-06 DIAGNOSIS — E559 Vitamin D deficiency, unspecified: Secondary | ICD-10-CM | POA: Diagnosis not present

## 2018-03-06 DIAGNOSIS — Z9889 Other specified postprocedural states: Secondary | ICD-10-CM

## 2018-03-06 DIAGNOSIS — Z79899 Other long term (current) drug therapy: Secondary | ICD-10-CM | POA: Insufficient documentation

## 2018-03-06 HISTORY — PX: NISSEN FUNDOPLICATION: SHX2091

## 2018-03-06 LAB — TYPE AND SCREEN
ABO/RH(D): B POS
Antibody Screen: NEGATIVE

## 2018-03-06 SURGERY — FUNDOPLICATION, NISSEN, ROBOT-ASSISTED, LAPAROSCOPIC
Anesthesia: General

## 2018-03-06 MED ORDER — HYDROMORPHONE HCL 1 MG/ML IJ SOLN
0.2500 mg | INTRAMUSCULAR | Status: DC | PRN
  Administered 2018-03-06 (×2): 0.5 mg via INTRAVENOUS

## 2018-03-06 MED ORDER — GABAPENTIN 300 MG PO CAPS
300.0000 mg | ORAL_CAPSULE | ORAL | Status: AC
  Administered 2018-03-06: 300 mg via ORAL
  Filled 2018-03-06: qty 1

## 2018-03-06 MED ORDER — 0.9 % SODIUM CHLORIDE (POUR BTL) OPTIME
TOPICAL | Status: DC | PRN
  Administered 2018-03-06: 1000 mL

## 2018-03-06 MED ORDER — KETAMINE HCL 10 MG/ML IJ SOLN
INTRAMUSCULAR | Status: DC | PRN
  Administered 2018-03-06: 30 mg via INTRAVENOUS

## 2018-03-06 MED ORDER — DEXAMETHASONE SODIUM PHOSPHATE 10 MG/ML IJ SOLN
INTRAMUSCULAR | Status: DC | PRN
  Administered 2018-03-06: 6 mg via INTRAVENOUS

## 2018-03-06 MED ORDER — PROPOFOL 10 MG/ML IV BOLUS
INTRAVENOUS | Status: DC | PRN
  Administered 2018-03-06: 150 mg via INTRAVENOUS

## 2018-03-06 MED ORDER — CELECOXIB 200 MG PO CAPS
200.0000 mg | ORAL_CAPSULE | ORAL | Status: AC
  Administered 2018-03-06: 200 mg via ORAL
  Filled 2018-03-06: qty 1

## 2018-03-06 MED ORDER — SUGAMMADEX SODIUM 200 MG/2ML IV SOLN
INTRAVENOUS | Status: DC | PRN
  Administered 2018-03-06: 200 mg via INTRAVENOUS

## 2018-03-06 MED ORDER — LACTATED RINGERS IV SOLN
INTRAVENOUS | Status: DC
  Administered 2018-03-06: 09:00:00 via INTRAVENOUS

## 2018-03-06 MED ORDER — CHLORHEXIDINE GLUCONATE CLOTH 2 % EX PADS: 6.0000 | MEDICATED_PAD | Freq: Once | CUTANEOUS | Status: DC

## 2018-03-06 MED ORDER — PHENYLEPHRINE 40 MCG/ML (10ML) SYRINGE FOR IV PUSH (FOR BLOOD PRESSURE SUPPORT)
PREFILLED_SYRINGE | INTRAVENOUS | Status: AC
  Filled 2018-03-06: qty 10

## 2018-03-06 MED ORDER — BUPIVACAINE-EPINEPHRINE 0.5% -1:200000 IJ SOLN
INTRAMUSCULAR | Status: DC | PRN
  Administered 2018-03-06: 15 mL

## 2018-03-06 MED ORDER — ONDANSETRON HCL 4 MG/2ML IJ SOLN
4.0000 mg | Freq: Four times a day (QID) | INTRAMUSCULAR | Status: DC | PRN
  Filled 2018-03-06: qty 2

## 2018-03-06 MED ORDER — MIDAZOLAM HCL 5 MG/5ML IJ SOLN
INTRAMUSCULAR | Status: DC | PRN
  Administered 2018-03-06: 2 mg via INTRAVENOUS

## 2018-03-06 MED ORDER — ROCURONIUM BROMIDE 10 MG/ML (PF) SYRINGE
PREFILLED_SYRINGE | INTRAVENOUS | Status: AC
  Filled 2018-03-06: qty 5

## 2018-03-06 MED ORDER — SUCCINYLCHOLINE CHLORIDE 200 MG/10ML IV SOSY
PREFILLED_SYRINGE | INTRAVENOUS | Status: DC | PRN
  Administered 2018-03-06: 100 mg via INTRAVENOUS

## 2018-03-06 MED ORDER — ROCURONIUM BROMIDE 50 MG/5ML IV SOSY
PREFILLED_SYRINGE | INTRAVENOUS | Status: DC | PRN
  Administered 2018-03-06: 10 mg via INTRAVENOUS
  Administered 2018-03-06: 50 mg via INTRAVENOUS
  Administered 2018-03-06: 10 mg via INTRAVENOUS

## 2018-03-06 MED ORDER — LIDOCAINE 2% (20 MG/ML) 5 ML SYRINGE
INTRAMUSCULAR | Status: AC
  Filled 2018-03-06: qty 5

## 2018-03-06 MED ORDER — LIDOCAINE 2% (20 MG/ML) 5 ML SYRINGE
INTRAMUSCULAR | Status: DC | PRN
  Administered 2018-03-06: 1.5 mg/kg/h via INTRAVENOUS

## 2018-03-06 MED ORDER — HYDROMORPHONE HCL 1 MG/ML IJ SOLN: 0.2500 mg | INTRAMUSCULAR | Status: DC | PRN

## 2018-03-06 MED ORDER — KETOROLAC TROMETHAMINE 30 MG/ML IJ SOLN
30.0000 mg | Freq: Four times a day (QID) | INTRAMUSCULAR | Status: DC | PRN
  Administered 2018-03-06 – 2018-03-08 (×6): 30 mg via INTRAVENOUS
  Filled 2018-03-06 (×6): qty 1

## 2018-03-06 MED ORDER — SUCCINYLCHOLINE CHLORIDE 200 MG/10ML IV SOSY
PREFILLED_SYRINGE | INTRAVENOUS | Status: AC
Start: 2018-03-06 — End: ?
  Filled 2018-03-06: qty 10

## 2018-03-06 MED ORDER — SUGAMMADEX SODIUM 200 MG/2ML IV SOLN
INTRAVENOUS | Status: AC
  Filled 2018-03-06: qty 2

## 2018-03-06 MED ORDER — DEXAMETHASONE SODIUM PHOSPHATE 10 MG/ML IJ SOLN
INTRAMUSCULAR | Status: AC
  Filled 2018-03-06: qty 1

## 2018-03-06 MED ORDER — LACTATED RINGERS IR SOLN
Status: DC | PRN
  Administered 2018-03-06: 1000 mL

## 2018-03-06 MED ORDER — ONDANSETRON HCL 4 MG/2ML IJ SOLN
INTRAMUSCULAR | Status: DC | PRN
  Administered 2018-03-06: 4 mg via INTRAVENOUS

## 2018-03-06 MED ORDER — HYDROMORPHONE HCL 1 MG/ML IJ SOLN
INTRAMUSCULAR | Status: AC
  Filled 2018-03-06: qty 1

## 2018-03-06 MED ORDER — FENTANYL CITRATE (PF) 250 MCG/5ML IJ SOLN
INTRAMUSCULAR | Status: AC
  Filled 2018-03-06: qty 5

## 2018-03-06 MED ORDER — CEFAZOLIN SODIUM-DEXTROSE 2-4 GM/100ML-% IV SOLN
2.0000 g | INTRAVENOUS | Status: AC
  Administered 2018-03-06: 2 g via INTRAVENOUS
  Filled 2018-03-06: qty 100

## 2018-03-06 MED ORDER — FENTANYL CITRATE (PF) 250 MCG/5ML IJ SOLN
INTRAMUSCULAR | Status: DC | PRN
  Administered 2018-03-06 (×3): 50 ug via INTRAVENOUS

## 2018-03-06 MED ORDER — ENOXAPARIN SODIUM 40 MG/0.4ML ~~LOC~~ SOLN
40.0000 mg | SUBCUTANEOUS | Status: DC
  Administered 2018-03-07 – 2018-03-08 (×2): 40 mg via SUBCUTANEOUS
  Filled 2018-03-06 (×2): qty 0.4

## 2018-03-06 MED ORDER — BUPIVACAINE-EPINEPHRINE (PF) 0.5% -1:200000 IJ SOLN
INTRAMUSCULAR | Status: AC
  Filled 2018-03-06: qty 30

## 2018-03-06 MED ORDER — ONDANSETRON 4 MG PO TBDP: 4.0000 mg | ORAL_TABLET | Freq: Four times a day (QID) | ORAL | Status: DC | PRN

## 2018-03-06 MED ORDER — PROPOFOL 10 MG/ML IV BOLUS
INTRAVENOUS | Status: AC
  Filled 2018-03-06: qty 20

## 2018-03-06 MED ORDER — DEXTROSE-NACL 5-0.9 % IV SOLN
INTRAVENOUS | Status: DC
  Administered 2018-03-06 – 2018-03-07 (×2): via INTRAVENOUS

## 2018-03-06 MED ORDER — ACETAMINOPHEN 500 MG PO TABS
1000.0000 mg | ORAL_TABLET | ORAL | Status: AC
  Administered 2018-03-06: 1000 mg via ORAL
  Filled 2018-03-06: qty 2

## 2018-03-06 MED ORDER — ONDANSETRON HCL 4 MG/2ML IJ SOLN
INTRAMUSCULAR | Status: AC
  Filled 2018-03-06: qty 2

## 2018-03-06 MED ORDER — MIDAZOLAM HCL 2 MG/2ML IJ SOLN
INTRAMUSCULAR | Status: AC
  Filled 2018-03-06: qty 2

## 2018-03-06 MED ORDER — LIDOCAINE 2% (20 MG/ML) 5 ML SYRINGE
INTRAMUSCULAR | Status: DC | PRN
  Administered 2018-03-06: 80 mg via INTRAVENOUS

## 2018-03-06 SURGICAL SUPPLY — 53 items
APPLIER CLIP 5 13 M/L LIGAMAX5 (MISCELLANEOUS)
APPLIER CLIP ROT 10 11.4 M/L (STAPLE)
BLADE SURG SZ11 CARB STEEL (BLADE) ×2 IMPLANT
CHLORAPREP W/TINT 26ML (MISCELLANEOUS) ×2 IMPLANT
CLIP APPLIE 5 13 M/L LIGAMAX5 (MISCELLANEOUS) IMPLANT
CLIP APPLIE ROT 10 11.4 M/L (STAPLE) IMPLANT
CLIP VESOLOCK LG 6/CT PURPLE (CLIP) IMPLANT
CLIP VESOLOCK MED LG 6/CT (CLIP) IMPLANT
COVER SURGICAL LIGHT HANDLE (MISCELLANEOUS) ×2 IMPLANT
COVER TIP SHEARS 8 DVNC (MISCELLANEOUS) IMPLANT
COVER TIP SHEARS 8MM DA VINCI (MISCELLANEOUS)
DECANTER SPIKE VIAL GLASS SM (MISCELLANEOUS) ×2 IMPLANT
DERMABOND ADVANCED (GAUZE/BANDAGES/DRESSINGS) ×1
DERMABOND ADVANCED .7 DNX12 (GAUZE/BANDAGES/DRESSINGS) ×1 IMPLANT
DRAIN PENROSE 18X1/2 LTX STRL (DRAIN) ×2 IMPLANT
DRAPE ARM DVNC X/XI (DISPOSABLE) ×4 IMPLANT
DRAPE COLUMN DVNC XI (DISPOSABLE) ×1 IMPLANT
DRAPE DA VINCI XI ARM (DISPOSABLE) ×4
DRAPE DA VINCI XI COLUMN (DISPOSABLE) ×1
ELECT REM PT RETURN 15FT ADLT (MISCELLANEOUS) ×2 IMPLANT
ENDOLOOP SUT PDS II  0 18 (SUTURE)
ENDOLOOP SUT PDS II 0 18 (SUTURE) IMPLANT
GAUZE 4X4 16PLY RFD (DISPOSABLE) ×2 IMPLANT
GLOVE BIO SURGEON STRL SZ7.5 (GLOVE) ×4 IMPLANT
GOWN STRL REUS W/TWL XL LVL3 (GOWN DISPOSABLE) ×8 IMPLANT
KIT BASIN OR (CUSTOM PROCEDURE TRAY) ×2 IMPLANT
MARKER SKIN DUAL TIP RULER LAB (MISCELLANEOUS) ×2 IMPLANT
NEEDLE HYPO 22GX1.5 SAFETY (NEEDLE) ×2 IMPLANT
NEEDLE INSUFFLATION 14GA 120MM (NEEDLE) ×2 IMPLANT
OBTURATOR OPTICAL STANDARD 8MM (TROCAR)
OBTURATOR OPTICAL STND 8 DVNC (TROCAR)
OBTURATOR OPTICALSTD 8 DVNC (TROCAR) IMPLANT
PACK CARDIOVASCULAR III (CUSTOM PROCEDURE TRAY) ×2 IMPLANT
PAD POSITIONING PINK XL (MISCELLANEOUS) ×2 IMPLANT
SCISSORS LAP 5X35 DISP (ENDOMECHANICALS) ×2 IMPLANT
SEAL CANN UNIV 5-8 DVNC XI (MISCELLANEOUS) ×4 IMPLANT
SEAL XI 5MM-8MM UNIVERSAL (MISCELLANEOUS) ×4
SEALER VESSEL DA VINCI XI (MISCELLANEOUS) ×1
SEALER VESSEL EXT DVNC XI (MISCELLANEOUS) ×1 IMPLANT
SET IRRIG TUBING LAPAROSCOPIC (IRRIGATION / IRRIGATOR) ×2 IMPLANT
SOLUTION ANTI FOG 6CC (MISCELLANEOUS) ×2 IMPLANT
SOLUTION ELECTROLUBE (MISCELLANEOUS) ×2 IMPLANT
STAPLER VISISTAT 35W (STAPLE) IMPLANT
SUT ETHIBOND 0 36 GRN (SUTURE) ×4 IMPLANT
SUT MNCRL AB 4-0 PS2 18 (SUTURE) ×2 IMPLANT
SUT SILK 0 SH 30 (SUTURE) ×6 IMPLANT
SUT SILK 2 0 SH (SUTURE) ×4 IMPLANT
SYR 20CC LL (SYRINGE) ×2 IMPLANT
TOWEL OR 17X26 10 PK STRL BLUE (TOWEL DISPOSABLE) ×2 IMPLANT
TOWEL OR NON WOVEN STRL DISP B (DISPOSABLE) ×2 IMPLANT
TRAY FOLEY W/METER SILVER 16FR (SET/KITS/TRAYS/PACK) IMPLANT
TROCAR ADV FIXATION 5X100MM (TROCAR) ×2 IMPLANT
TUBING INSUFFLATION (TUBING) ×2 IMPLANT

## 2018-03-06 NOTE — Transfer of Care (Signed)
Immediate Anesthesia Transfer of Care Note  Patient: Whitney Peters  Procedure(s) Performed: XI ROBOTIC HIATAL HERNIA REPAIR AND NISSEN FUNDOPLICATION (N/A )  Patient Location: PACU  Anesthesia Type:General  Level of Consciousness: awake, alert  and oriented  Airway & Oxygen Therapy: Patient Spontanous Breathing and Patient connected to face mask oxygen  Post-op Assessment: Report given to RN and Post -op Vital signs reviewed and stable  Post vital signs: Reviewed and stable  Last Vitals:  Vitals Value Taken Time  BP 167/92 03/06/2018  1:25 PM  Temp    Pulse 77 03/06/2018  1:26 PM  Resp 12 03/06/2018  1:26 PM  SpO2 100 % 03/06/2018  1:26 PM  Vitals shown include unvalidated device data.  Last Pain:  Vitals:   03/06/18 0914  TempSrc: Oral  PainSc:          Complications: No apparent anesthesia complications

## 2018-03-06 NOTE — Op Note (Signed)
03/06/2018  1:11 PM  PATIENT:  Margaretha Glassing Axtell  57 y.o. female  PRE-OPERATIVE DIAGNOSIS:  GERD, small hiatal hernia  POST-OPERATIVE DIAGNOSIS:  GERS, small hiatal hernia  PROCEDURE:  Procedure(s): XI ROBOTIC HIATAL HERNIA REPAIR AND NISSEN FUNDOPLICATION (N/A)  SURGEON:  Surgeon(s) and Role:    * Axel Filler, MD - Primary    Karie Soda, MD - Assisting   ANESTHESIA:   local and general  EBL:  20 mL   BLOOD ADMINISTERED:none  DRAINS: none   LOCAL MEDICATIONS USED:  BUPIVICAINE   SPECIMEN:  No Specimen  DISPOSITION OF SPECIMEN:  N/A  COUNTS:  YES  TOURNIQUET:  * No tourniquets in log *  DICTATION: .Dragon Dictation The patient was taken back to the operating room and placed in the supine position with bilateral SCDs in place. The patient was prepped and draped in the usual sterile fashion. After appropriate antibiotics were confirmed a timeout was called and all facts were verified.   A Veress needle technique was used to insufflate the abdomen to 15 mm of mercury the paramedian stab incision. Subsequent to this an 8 mm trocar was introduced as was a 8 millimeter camera. At this time the subsequent robotic trochars x3, were then placed adjacent to this trocar approximately 8-10 cm away. Each trocar was inserted under direct visualization, there were total of 4 trochars. The assistant trocar was then placed in the right lower quadrant under direct visualization. The Nathanson retractor was then visualized inserted into the abdomen and the incision just to the left of the falciform ligament. This was then placed to retract the liver appropriately. At this time the patient was positioned in reverse Trendelenburg.   At this time the robot patient cart was brought to the bedside and placed in good position and the arms were docked to the trochars appropriately. At this time I proceeded to incised the gastrohepatic ligament.  At this time I proceeded to mobilize the stomach  inferiorly and visualize the right crus. The peritoneum over the right crus was incised and right crus was identified. I proceeded to dissect this inferiorly until the left crus was seen joining the right crus. Once the right crus was adequately dissected we turned our to the left crus which was dissected away. This required traction of the stomach to the right side. Once this was visualized we then proceeded to circumferentially dissect the esophagus away from the surrounding tissue. At this time a Penrose drain was placed around the esophagus to help with retraction. At this time the phrenoesophageal fat pad was dissected away from the esophagus. There was a small-sized hiatal hernia seen. I mobilized the esophagus cephalad approximately 3-4 cm, clearing away the surrounding tissue.   At this time we turned our attention to the greater curvature the stomach and the omentum was mobilized using the robotic vessel sealer. This was taken up to the greater curvature to the hiatus. This mobilized the entire greater curvature to allow mobilization and the wrap. I then proceeded to bring the greater curvature the stomach posterior to the esophagus, and a shoeshine technique was used to evaluate the mobilization of the greater curvature.   At this time I proceeded to close the hiatus using figure of 8 0 Ethibonds x 2. This brought together the hiatal closure without undue stricture to the esophagus. No mesh was placed.  At this time the greater curvature was brought around the esophagus and sutured using 0 silk sutures interrupted fashion approximately 1 cm  apart x3. The middle suture was sutured to the esophagus. A left collar stitch was then used to gastropexy the stomach from the wrap to the diaphragm just lateral to the left crus as.  A second collar stitch was placed from the wrap to the right crus. The wrap lay at approximately 11:00 on its own with undue tension.  The wrap lay loose with no strangulation of  the esophagus.  At this time the robot was undocked. The liver trocar was removed. At this time insufflation was evacuated. Skin was reapproximated for Monocryl subcuticular fashion. The skin was then dressed with Dermabond. The patient tolerated the procedure well and was taken to the recovery room in stable condition.    PLAN OF CARE: Admit for overnight observation  PATIENT DISPOSITION:  PACU - hemodynamically stable.   Delay start of Pharmacological VTE agent (>24hrs) due to surgical blood loss or risk of bleeding: yes

## 2018-03-06 NOTE — Discharge Instructions (Signed)
EATING AFTER YOUR ESOPHAGEAL SURGERY °(Stomach Fundoplication, Hiatal Hernia repair, Achalasia surgery, etc) ° °###################################################################### ° °EAT °Start with a pureed / full liquid diet (see below) °Gradually transition to a high fiber diet with a fiber supplement over the next month after discharge.   ° °WALK °Walk an hour a day.  Control your pain to do that.   ° °CONTROL PAIN °Control pain so that you can walk, sleep, tolerate sneezing/coughing, go up/down stairs. ° °HAVE A BOWEL MOVEMENT DAILY °Keep your bowels regular to avoid problems.  OK to try a laxative to override constipation.  OK to use an antidairrheal to slow down diarrhea.  Call if not better after 2 tries ° °CALL IF YOU HAVE PROBLEMS/CONCERNS °Call if you are still struggling despite following these instructions. °Call if you have concerns not answered by these instructions ° °###################################################################### ° ° °After your esophageal surgery, expect some sticking with swallowing over the next 1-2 months.   ° °If food sticks when you eat, it is called "dysphagia".  This is due to swelling around your esophagus at the wrap & hiatal diaphragm repair.  It will gradually ease off over the next few months.  To help you through this temporary phase, we start you out on a pureed (blenderized) diet. ° °Your first meal in the hospital was thin liquids.  You should have been given a pureed diet by the time you left the hospital.  We ask patients to stay on a pureed diet for the first 2-3 weeks to avoid anything getting "stuck" near your recent surgery.  Don't be alarmed if your ability to swallow doesn't progress according to this plan.  Everyone is different and some diets can advance more or less quickly.   ° ° °Some BASIC RULES to follow are: °· Maintain an upright position whenever eating or drinking. °· Take small bites - just a teaspoon size bite at a time. °· Eat slowly.   It may also help to eat only one food at a time. °· Consider nibbling through smaller, more frequent meals & avoid the urge to eat BIG meals °· Do not push through feelings of fullness, nausea, or bloatedness °· Do not mix solid foods and liquids in the same mouthful °· Try not to "wash foods down" with large gulps of liquids. °· Avoid carbonated (bubbly/fizzy) drinks.   °· Avoid foods that make you feel gassy or bloated.  Start with bland foods first.  Wait on trying greasy, fried, or spicy meals until you are tolerating more bland solids well. °· Understand that it will be hard to burp and belch at first.  This gradually improves with time.  Expect to be more gassy/flatulent/bloated initially.  Walking will help your body manage it better. °· Consider using medications for bloating that contain simethicone such as  Maalox or Gas-X  °· Eat in a relaxed atmosphere & minimize distractions. °· Avoid talking while eating.   °· Do not use straws. °· Following each meal, sit in an upright position (90 degree angle) for 60 to 90 minutes.  Going for a short walk can help as well °· If food does stick, don't panic.  Try to relax and let the food pass on its own.  Sipping WARM LIQUID such as strong hot black tea can also help slide it down. ° ° °Be gradual in changes & use common sense: ° °-If you easily tolerating a certain "level" of foods, advance to the next level gradually °-If you are   having trouble swallowing a particular food, then avoid it.   °-If food is sticking when you advance your diet, go back to thinner previous diet (the lower LEVEL) for 1-2 days. ° °LEVEL 1 = PUREED DIET ° °Do for the first 2 WEEKS AFTER SURGERY ° °-Foods in this group are pureed or blenderized to a smooth, mashed potato-like consistency.  °-If necessary, the pureed foods can keep their shape with the addition of a thickening agent.   °-Meat should be pureed to a smooth, pasty consistency.  Hot broth or gravy may be added to the pureed  meat, approximately 1 oz. of liquid per 3 oz. serving of meat. °-CAUTION:  If any foods do not puree into a smooth consistency, swallowing will be more difficult.  (For example, nuts or seeds sometimes do not blend well.) ° °Hot Foods Cold Foods  °Pureed scrambled eggs and cheese Pureed cottage cheese  °Baby cereals Thickened juices and nectars  °Thinned cooked cereals (no lumps) Thickened milk or eggnog  °Pureed French toast or pancakes Ensure  °Mashed potatoes Ice cream  °Pureed parsley, au gratin, scalloped potatoes, candied sweet potatoes Fruit or Italian ice, sherbet  °Pureed buttered or alfredo noodles Plain yogurt  °Pureed vegetables (no corn or peas) Instant breakfast  °Pureed soups and creamed soups Smooth pudding, mousse, custard  °Pureed scalloped apples Whipped gelatin  °Gravies Sugar, syrup, honey, jelly  °Sauces, cheese, tomato, barbecue, white, creamed Cream  °Any baby food Creamer  °Alcohol in moderation (not beer or champagne) Margarine  °Coffee or tea Mayonnaise  ° Ketchup, mustard  ° Apple sauce  ° °SAMPLE MENU:  PUREED DIET °Breakfast Lunch Dinner  °· Orange juice, 1/2 cup °· Cream of wheat, 1/2 cup · Pineapple juice, 1/2 cup · Pureed turkey, barley soup, 3/4 cup °· Pureed Hawaiian chicken, 3 oz  °· Scrambled eggs, mashed or blended with cheese, 1/2 cup °· Tea or coffee, 1 cup  °· Whole milk, 1 cup  °· Non-dairy creamer, 2 Tbsp. · Mashed potatoes, 1/2 cup °· Pureed cooled broccoli, 1/2 cup °· Apple sauce, 1/2 cup °· Coffee or tea · Mashed potatoes, 1/2 cup °· Pureed spinach, 1/2 cup °· Frozen yogurt, 1/2 cup °· Tea or coffee  ° ° ° ° °LEVEL 2 = SOFT DIET ° °After your first 2 weeks, you can advance to a soft diet.   °Keep on this diet until everything goes down easily. ° °Hot Foods Cold Foods  °White fish Cottage cheese  °Stuffed fish Junior baby fruit  °Baby food meals Semi thickened juices  °Minced soft cooked, scrambled, poached eggs nectars  °Souffle & omelets Ripe mashed bananas  °Cooked  cereals Canned fruit, pineapple sauce, milk  °potatoes Milkshake  °Buttered or Alfredo noodles Custard  °Cooked cooled vegetable Puddings, including tapioca  °Sherbet Yogurt  °Vegetable soup or alphabet soup Fruit ice, Italian ice  °Gravies Whipped gelatin  °Sugar, syrup, honey, jelly Junior baby desserts  °Sauces:  Cheese, creamed, barbecue, tomato, white Cream  °Coffee or tea Margarine  ° °SAMPLE MENU:  LEVEL 2 °Breakfast Lunch Dinner  °· Orange juice, 1/2 cup °· Oatmeal, 1/2 cup °· Scrambled eggs with cheese, 1/2 cup °· Decaffeinated tea, 1 cup °· Whole milk, 1 cup °· Non-dairy creamer, 2 Tbsp · Pineapple juice, 1/2 cup °· Minced beef, 3 oz °· Gravy, 2 Tbsp °· Mashed potatoes, 1/2 cup °· Minced fresh broccoli, 1/2 cup °· Applesauce, 1/2 cup °· Coffee, 1 cup · Turkey, barley soup, 3/4 cup °·   Minced Hawaiian chicken, 3 oz °· Mashed potatoes, 1/2 cup °· Cooked spinach, 1/2 cup °· Frozen yogurt, 1/2 cup °· Non-dairy creamer, 2 Tbsp  ° ° ° ° °LEVEL 3 = CHOPPED DIET ° °-After all the foods in level 2 (soft diet) are passing through well you should advance up to more chopped foods.  °-It is still important to cut these foods into small pieces and eat slowly. ° °Hot Foods Cold Foods  °Poultry Cottage cheese  °Chopped Swedish meatballs Yogurt  °Meat salads (ground or flaked meat) Milk  °Flaked fish (tuna) Milkshakes  °Poached or scrambled eggs Soft, cold, dry cereal  °Souffles and omelets Fruit juices or nectars  °Cooked cereals Chopped canned fruit  °Chopped French toast or pancakes Canned fruit cocktail  °Noodles or pasta (no rice) Pudding, mousse, custard  °Cooked vegetables (no frozen peas, corn, or mixed vegetables) Green salad  °Canned small sweet peas Ice cream  °Creamed soup or vegetable soup Fruit ice, Italian ice  °Pureed vegetable soup or alphabet soup Non-dairy creamer  °Ground scalloped apples Margarine  °Gravies Mayonnaise  °Sauces:  Cheese, creamed, barbecue, tomato, white Ketchup  °Coffee or tea Mustard   ° °SAMPLE MENU:  LEVEL 3 °Breakfast Lunch Dinner  °· Orange juice, 1/2 cup °· Oatmeal, 1/2 cup °· Scrambled eggs with cheese, 1/2 cup °· Decaffeinated tea, 1 cup °· Whole milk, 1 cup °· Non-dairy creamer, 2 Tbsp °· Ketchup, 1 Tbsp °· Margarine, 1 tsp °· Salt, 1/4 tsp °· Sugar, 2 tsp · Pineapple juice, 1/2 cup °· Ground beef, 3 oz °· Gravy, 2 Tbsp °· Mashed potatoes, 1/2 cup °· Cooked spinach, 1/2 cup °· Applesauce, 1/2 cup °· Decaffeinated coffee °· Whole milk °· Non-dairy creamer, 2 Tbsp °· Margarine, 1 tsp °· Salt, 1/4 tsp · Pureed turkey, barley soup, 3/4 cup °· Barbecue chicken, 3 oz °· Mashed potatoes, 1/2 cup °· Ground fresh broccoli, 1/2 cup °· Frozen yogurt, 1/2 cup °· Decaffeinated tea, 1 cup °· Non-dairy creamer, 2 Tbsp °· Margarine, 1 tsp °· Salt, 1/4 tsp °· Sugar, 1 tsp  ° ° °LEVEL 4:  REGULAR FOODS ° °-Foods in this group are soft, moist, regularly textured foods.   °-This level includes meat and breads, which tend to be the hardest things to swallow.   °-Eat very slowly, chew well and continue to avoid carbonated drinks. °-most people are at this level in 4-6 weeks ° °Hot Foods Cold Foods  °Baked fish or skinned Soft cheeses - cottage cheese  °Souffles and omelets Cream cheese  °Eggs Yogurt  °Stuffed shells Milk  °Spaghetti with meat sauce Milkshakes  °Cooked cereal Cold dry cereals (no nuts, dried fruit, coconut)  °French toast or pancakes Crackers  °Buttered toast Fruit juices or nectars  °Noodles or pasta (no rice) Canned fruit  °Potatoes (all types) Ripe bananas  °Soft, cooked vegetables (no corn, lima, or baked beans) Peeled, ripe, fresh fruit  °Creamed soups or vegetable soup Cakes (no nuts, dried fruit, coconut)  °Canned chicken noodle soup Plain doughnuts  °Gravies Ice cream  °Bacon dressing Pudding, mousse, custard  °Sauces:  Cheese, creamed, barbecue, tomato, white Fruit ice, Italian ice, sherbet  °Decaffeinated tea or coffee Whipped gelatin  °Pork chops Regular gelatin  ° Canned fruited  gelatin molds  ° Sugar, syrup, honey, jam, jelly  ° Cream  ° Non-dairy  ° Margarine  ° Oil  ° Mayonnaise  ° Ketchup  ° Mustard  ° °TROUBLESHOOTING IRREGULAR BOWELS  °1) Avoid extremes of bowel   movements (no bad constipation/diarrhea)  °2) Miralax 17gm mixed in 8oz. water or juice-daily. May use BID as needed.  °3) Gas-x,Phazyme, etc. as needed for gas & bloating.  °4) Soft,bland diet. No spicy,greasy,fried foods.  °5) Prilosec over-the-counter as needed  °6) May hold gluten/wheat products from diet to see if symptoms improve.  °7) May try probiotics (Align, Activa, etc) to help calm the bowels down  °7) If symptoms become worse call back immediately. ° ° ° °If you have any questions please call our office at [facility]: [phone]. ° °

## 2018-03-06 NOTE — Anesthesia Preprocedure Evaluation (Addendum)
Anesthesia Evaluation  Patient identified by MRN, date of birth, ID band Patient awake    Reviewed: Allergy & Precautions, NPO status   Airway Mallampati: II  TM Distance: >3 FB     Dental   Pulmonary asthma , former smoker,    breath sounds clear to auscultation       Cardiovascular + Peripheral Vascular Disease  + Valvular Problems/Murmurs  Rhythm:Regular Rate:Normal     Neuro/Psych    GI/Hepatic Neg liver ROS, GERD  ,  Endo/Other  negative endocrine ROS  Renal/GU negative Renal ROS     Musculoskeletal  (+) Arthritis ,   Abdominal   Peds  Hematology   Anesthesia Other Findings   Reproductive/Obstetrics                             Anesthesia Physical Anesthesia Plan  ASA: III  Anesthesia Plan: General   Post-op Pain Management:    Induction: Intravenous  PONV Risk Score and Plan: 3 and Treatment may vary due to age or medical condition, Ondansetron, Dexamethasone and Midazolam  Airway Management Planned: Oral ETT  Additional Equipment:   Intra-op Plan:   Post-operative Plan: Possible Post-op intubation/ventilation  Informed Consent: I have reviewed the patients History and Physical, chart, labs and discussed the procedure including the risks, benefits and alternatives for the proposed anesthesia with the patient or authorized representative who has indicated his/her understanding and acceptance.   Dental advisory given  Plan Discussed with: CRNA and Anesthesiologist  Anesthesia Plan Comments:        Anesthesia Quick Evaluation

## 2018-03-06 NOTE — H&P (Signed)
History of Present Illness The patient is a 57 year old female who presents with a hiatal hernia. Referred by: Dr. Ileene Patrick Chief Complaint: GERD Patient is a 57 year old female with a history of severe reflux, previous DVT in the past, who comes in with a significant history of reflux. Patient states that this reflux or possibly a 14-15 years ago. She states that she's been treated with multiple reflux medications however continues with reflux at this time. 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 She states that she's had 6 dilations in the last 2 years.  Patient states that she does have coughing, reflux at night, sleeps on multiple pillows to help with reflux. She endorses burning, water brash, regurgitating undigested food.  Patient underwent manometry study as well as pH probe study which revealed a DeMeester score of 22.9 and normal relaxation of the EG junction with no significant esophageal peristaltic abnormality. I did review these studies personally.  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 She was. She worked up by hematology and there was no organic cause. Patient was on blood thinners at that time however currently is not on any blood thinners.   Allergies  No Known Drug Allergies [01/17/2018]: Allergies Reconciled   Medication History  Baclofen (  Tablet, Oral) Active. RaNITidine HCl (  Tablet, Oral) Active. Medications Reconciled    Review of Systems  General Not Present- Appetite Loss, Chills, Fatigue, Fever, Night Sweats, Weight Gain and Weight Loss. Skin Not Present- Change in Wart/Mole, Dryness, Hives, Jaundice, New Lesions, Non-Healing Wounds, Rash and Ulcer. HEENT Present- Wears glasses/contact lenses. Not Present- Earache, Hearing Loss, Hoarseness, Nose Bleed, Oral Ulcers, Ringing in the Ears, Seasonal Allergies, Sinus Pain, Sore Throat, Visual Disturbances and Yellow Eyes. Breast Not  Present- Breast Mass, Breast Pain, Nipple Discharge and Skin Changes. Cardiovascular Not Present- Chest Pain, Difficulty Breathing Lying Down, Leg Cramps, Palpitations, Rapid Heart Rate, Shortness of Breath and Swelling of Extremities. Gastrointestinal Not Present- Abdominal Pain, Bloating, Bloody Stool, Change in Bowel Habits, Chronic diarrhea, Constipation, Difficulty Swallowing, Excessive gas, Gets full quickly at meals, Hemorrhoids, Indigestion, Nausea, Rectal Pain and Vomiting. Musculoskeletal Not Present- Back Pain, Joint Pain, Joint Stiffness, Muscle Pain, Muscle Weakness and Swelling of Extremities. Neurological Not Present- Decreased Memory, Fainting, Headaches, Numbness, Seizures, Tingling, Tremor, Trouble walking and Weakness. Psychiatric Not Present- Anxiety, Bipolar, Change in Sleep Pattern, Depression, Fearful and Frequent crying. Endocrine Not Present- Cold Intolerance, Excessive Hunger, Hair Changes, Heat Intolerance and New Diabetes. Hematology Not Present- Blood Thinners, Easy Bruising, Excessive bleeding, Gland problems, HIV and Persistent Infections. All other systems negative   Ht  (1.753 m)   Wt 88.5 kg (195 lb)   BMI 28.80 kg/m    Physical Exam The physical exam findings are as follows: Note:Constitutional: No acute distress, conversant, appears stated age  Eyes: Anicteric sclerae, moist conjunctiva, no lid lag  Neck: No thyromegaly, trachea midline, no cervical lymphadenopathy  Lungs: Clear to auscultation biilaterally, normal respiratory effot  Cardiovascular: regular rate & rhythm, no murmurs, no peripheal edema, pedal pulses 2+  GI: Soft, no masses or hepatosplenomegaly, non-tender to palpation  MSK: Normal gait, no clubbing cyanosis, edema  Skin: No rashes, palpation reveals normal skin turgor  Psychiatric: Appropriate judgment and insight, oriented to person, place, and time    Assessment & Plan GASTRIC REFLUX  (K21.9) Impression: 57 year old female with a history of severe GERD, and small hiatal hernia  1. Will proceed to the operating room for a  robotic hiatal hernia repair with mesh and Nissen fundoplication  2. Discussed with patient the risks and benefits of the procedure to include but not limited to: Infection, bleeding, damage to structures, possible pneumothorax, possible recurrence. The patient voiced understanding and wishes to proceed. HIATAL HERNIA (K44.9) Impression: As above

## 2018-03-06 NOTE — Anesthesia Procedure Notes (Signed)
Procedure Name: Intubation Date/Time: 03/06/2018 11:19 AM Performed by: Maxwell Caul, CRNA Pre-anesthesia Checklist: Patient identified, Emergency Drugs available, Suction available and Patient being monitored Patient Re-evaluated:Patient Re-evaluated prior to induction Oxygen Delivery Method: Circle system utilized Preoxygenation: Pre-oxygenation with 100% oxygen Induction Type: IV induction, Rapid sequence and Cricoid Pressure applied Laryngoscope Size: Mac and 4 Grade View: Grade I Tube type: Oral Tube size: 7.5 mm Number of attempts: 1 Airway Equipment and Method: Stylet Placement Confirmation: ETT inserted through vocal cords under direct vision,  positive ETCO2 and breath sounds checked- equal and bilateral Secured at: 21 cm Tube secured with: Tape Dental Injury: Teeth and Oropharynx as per pre-operative assessment

## 2018-03-06 NOTE — Anesthesia Postprocedure Evaluation (Signed)
Anesthesia Post Note  Patient: [name], advertising  Procedure(s) Performed: XI ROBOTIC HIATAL HERNIA REPAIR AND NISSEN FUNDOPLICATION (N/A )     Patient location during evaluation: PACU Anesthesia Type: General Level of consciousness: awake Pain management: pain level controlled Vital Signs Assessment: post-procedure vital signs reviewed and stable Respiratory status: spontaneous breathing Cardiovascular status: stable Anesthetic complications: no    Last Vitals:  Vitals:   03/06/18 1628 03/06/18 1723  BP: 112/77 129/72  Pulse: 60 (!) 59  Resp: 16 16  Temp: 36.6 C 36.4 C  SpO2: 96% 95%    Last Pain:  Vitals:   03/06/18 1754  TempSrc:   PainSc: 4                  Shana Zavaleta

## 2018-03-07 ENCOUNTER — Observation Stay (HOSPITAL_COMMUNITY): Payer: Federal, State, Local not specified - PPO

## 2018-03-07 DIAGNOSIS — I739 Peripheral vascular disease, unspecified: Secondary | ICD-10-CM | POA: Diagnosis not present

## 2018-03-07 DIAGNOSIS — Z86718 Personal history of other venous thrombosis and embolism: Secondary | ICD-10-CM | POA: Diagnosis not present

## 2018-03-07 DIAGNOSIS — Z87891 Personal history of nicotine dependence: Secondary | ICD-10-CM | POA: Diagnosis not present

## 2018-03-07 DIAGNOSIS — K219 Gastro-esophageal reflux disease without esophagitis: Secondary | ICD-10-CM | POA: Diagnosis not present

## 2018-03-07 DIAGNOSIS — Z79899 Other long term (current) drug therapy: Secondary | ICD-10-CM | POA: Diagnosis not present

## 2018-03-07 DIAGNOSIS — K449 Diaphragmatic hernia without obstruction or gangrene: Secondary | ICD-10-CM | POA: Diagnosis not present

## 2018-03-07 DIAGNOSIS — J45909 Unspecified asthma, uncomplicated: Secondary | ICD-10-CM | POA: Diagnosis not present

## 2018-03-07 DIAGNOSIS — M1990 Unspecified osteoarthritis, unspecified site: Secondary | ICD-10-CM | POA: Diagnosis not present

## 2018-03-07 DIAGNOSIS — K228 Other specified diseases of esophagus: Secondary | ICD-10-CM | POA: Diagnosis not present

## 2018-03-07 DIAGNOSIS — Z9889 Other specified postprocedural states: Secondary | ICD-10-CM | POA: Diagnosis not present

## 2018-03-07 LAB — BASIC METABOLIC PANEL
Anion gap: 8 (ref 5–15)
BUN: 10 mg/dL (ref 6–20)
CALCIUM: 8.6 mg/dL — AB (ref 8.9–10.3)
CO2: 23 mmol/L (ref 22–32)
Chloride: 110 mmol/L (ref 101–111)
Creatinine, Ser: 0.79 mg/dL (ref 0.44–1.00)
GFR calc non Af Amer: >60 mL/min (ref >60)
Glucose, Bld: 133 mg/dL — ABNORMAL HIGH (ref 65–99)
Potassium: 4.1 mmol/L (ref 3.5–5.1)
SODIUM: 141 mmol/L (ref 135–145)

## 2018-03-07 MED ORDER — IOPAMIDOL (ISOVUE-300) INJECTION 61%
INTRAVENOUS | Status: AC
  Administered 2018-03-07: 150 mL via ORAL
  Filled 2018-03-07: qty 150

## 2018-03-07 MED ORDER — TRAMADOL 5 MG/ML ORAL SUSPENSION: 50.0000 mg | Freq: Four times a day (QID) | ORAL | Status: DC | PRN

## 2018-03-07 MED ORDER — TRAMADOL HCL 50 MG PO TABS: 50.0000 mg | ORAL_TABLET | Freq: Four times a day (QID) | ORAL | Status: DC | PRN

## 2018-03-07 NOTE — Progress Notes (Signed)
1 Day Post-Op   Subjective/Chief Complaint: Pt doing well this AM Ambulated this AM States no reflux overnight Still with some pain   Objective: Vital signs in last 24 hours: Temp:  [97.6 F (36.4 C)-98.6 F (37 C)] 97.9 F (36.6 C) (05/02 0543) Pulse Rate:  [54-82] 82 (05/02 0543) Resp:  [7-18] 17 (05/02 0543) BP: (88-167)/(48-92) 121/76 (05/02 0543) SpO2:  [83 %-100 %] 95 % (05/02 0543) Weight:  [88.5 kg (195 lb)] 88.5 kg (195 lb) (05/01 0854) Last BM Date: 03/06/18  Intake/Output from previous day: 05/01 0701 - 05/02 0700 In: 2126.7 [I.V.:2026.7; IV Piggyback:100] Out: 1020 [Urine:1000; Blood:20] Intake/Output this shift: No intake/output data recorded.  General appearance: alert GI: soft, non-tender; bowel sounds normal; no masses,  no organomegaly  Lab Results:  No results for input(s): WBC, HGB, HCT, PLT in the last 72 hours. BMET Recent Labs    03/07/18 0440  NA 141  K 4.1  CL 110  CO2 23  GLUCOSE 133*  BUN 10  CREATININE 0.79  CALCIUM 8.6*   PT/INR No results for input(s): LABPROT, INR in the last 72 hours. ABG No results for input(s): PHART, HCO3 in the last 72 hours.  Invalid input(s): PCO2, PO2  Studies/Results: No results found.  Anti-infectives: Anti-infectives (From admission, onward)   Start     Dose/Rate Route Frequency Ordered Stop   03/06/18 0832  ceFAZolin (ANCEF) IVPB 2g/100 mL premix     2 g 200 mL/hr over 30 Minutes Intravenous On call to O.R. 03/06/18 0832 03/06/18 1143      Assessment/Plan: s/p Procedure(s): XI ROBOTIC HIATAL HERNIA REPAIR AND NISSEN FUNDOPLICATION (N/A) await Esophagogram and than start clears  Pt to crush all Rxs Mobilize Hopefully home tomorrow AM  LOS: 0 days    Marigene Ehlers., Jed [place] 03/07/2018

## 2018-03-07 NOTE — Plan of Care (Signed)
Nutrition Education Note  RD consulted for nutrition education regarding patient who is s/p nissen fundoplication.  RD provided handout on Nissen Fundoplication nutrition therapy. Reviewed clear and full liquids. Encouraged pt to avoid caffeine, carbonated beverages and the use of straws.  Provided examples of appropriate foods on a soft diet. Reviewed gas producing foods. Discouraged intake of processed foods and red meat. Recommended use of a liquid multivitamin while following a liquid diet. Reviewed acceptable protein supplements.  RD discussed why it is important for patient to adhere to diet recommendations. Teach back method used.  Expect good compliance. Pt with family support and her son has had this procedure before.  Body mass index is 28.8 kg/m.  Pt meets criteria for overweight based on current BMI.  Current diet order is clear liquids. Labs and medications reviewed. No further nutrition interventions warranted at this time. If additional nutrition issues arise, please re-consult RD.   Whitney Franco, MS, RD, LDN [facility] Inpatient Clinical Dietitian Pager: [phone] After Hours Pager: [phone]

## 2018-03-08 DIAGNOSIS — M1990 Unspecified osteoarthritis, unspecified site: Secondary | ICD-10-CM | POA: Diagnosis not present

## 2018-03-08 DIAGNOSIS — Z79899 Other long term (current) drug therapy: Secondary | ICD-10-CM | POA: Diagnosis not present

## 2018-03-08 DIAGNOSIS — K228 Other specified diseases of esophagus: Secondary | ICD-10-CM | POA: Diagnosis not present

## 2018-03-08 DIAGNOSIS — Z87891 Personal history of nicotine dependence: Secondary | ICD-10-CM | POA: Diagnosis not present

## 2018-03-08 DIAGNOSIS — Z86718 Personal history of other venous thrombosis and embolism: Secondary | ICD-10-CM | POA: Diagnosis not present

## 2018-03-08 DIAGNOSIS — I739 Peripheral vascular disease, unspecified: Secondary | ICD-10-CM | POA: Diagnosis not present

## 2018-03-08 DIAGNOSIS — J45909 Unspecified asthma, uncomplicated: Secondary | ICD-10-CM | POA: Diagnosis not present

## 2018-03-08 DIAGNOSIS — K449 Diaphragmatic hernia without obstruction or gangrene: Secondary | ICD-10-CM | POA: Diagnosis not present

## 2018-03-08 DIAGNOSIS — K219 Gastro-esophageal reflux disease without esophagitis: Secondary | ICD-10-CM | POA: Diagnosis not present

## 2018-03-08 MED ORDER — TRAMADOL HCL 50 MG PO TABS: 50.0000 mg | ORAL_TABLET | Freq: Four times a day (QID) | ORAL | 0 refills | Status: DC | PRN

## 2018-03-08 MED ORDER — DIPHENOXYLATE-ATROPINE 2.5-0.025 MG PO TABS
2.0000 | ORAL_TABLET | Freq: Once | ORAL | Status: AC
  Administered 2018-03-08: 2 via ORAL
  Filled 2018-03-08: qty 2

## 2018-03-08 MED ORDER — DIPHENOXYLATE-ATROPINE 2.5-0.025 MG/5ML PO LIQD: 10.0000 mL | Freq: Once | ORAL | Status: DC

## 2018-03-08 NOTE — Discharge Summary (Signed)
Physician Discharge Summary  Patient ID: Whitney Peters MRN: [id number] DOB/AGE: Apr 18, 1961 57 y.o.  Admit date: 03/06/2018 Discharge date: 03/08/2018  Admission Diagnoses: Uncontrolled reflux  Discharge Diagnoses:  Active Problems:   S/P Nissen fundoplication (without gastrostomy tube) procedure   Discharged Condition: good  Hospital Course: Patient is a 57 year old female who was admitted status post robotic hiatal hernia repair and Nissen fundoplication.  Patient was admitted to the floor.  Patient underwent esophagogram on postop day 1 which revealed no leaks.  Therefore patient was started on liquid diet.  Patient tolerated that well.  Patient had good pain control.  Patient was up ambulating well on her own.  She was otherwise afebrile, deemed stable for discharge and discharged home.  Consults: Dietitian  Significant Diagnostic Studies: Esophagram, normal without leak  Treatments: surgery: As above  Discharge Exam: Blood pressure 118/62, pulse 63, temperature 98.2 F (36.8 C), temperature source Oral, resp. rate 15, height  (1.753 m), weight 88.5 kg (195 lb), SpO2 97 %. General appearance: alert and cooperative GI: soft, non-tender; bowel sounds normal; no masses,  no organomegaly and inc c/d/i  Disposition: Discharge disposition: 01-Home or Self Care       Discharge Instructions    Diet - low sodium heart healthy   Complete by:  As directed    Increase activity slowly   Complete by:  As directed      Allergies as of 03/08/2018      Reactions   Xarelto [rivaroxaban] Other (See Comments)   nausea   Pradaxa [dabigatran Etexilate Mesylate] Nausea Only      Medication List    STOP taking these medications   ibuprofen 400 MG tablet Commonly known as:  ADVIL,MOTRIN   naproxen sodium 220 MG tablet Commonly known as:  ALEVE   ranitidine 150 MG tablet Commonly known as:  ZANTAC      Follow-up Information    Axel Filler, MD. Schedule an  appointment as soon as possible for a visit in 2 weeks.   Specialty:  General Surgery Why:  For wound re-check Contact information: [address] [place] [address] [phone]           Signed: Marigene Ehlers., [facility] 03/08/2018, 7:33 AM

## 2018-03-15 DIAGNOSIS — L03311 Cellulitis of abdominal wall: Secondary | ICD-10-CM | POA: Diagnosis not present

## 2018-03-15 DIAGNOSIS — N760 Acute vaginitis: Secondary | ICD-10-CM | POA: Diagnosis not present

## 2018-03-15 DIAGNOSIS — Z9889 Other specified postprocedural states: Secondary | ICD-10-CM | POA: Diagnosis not present

## 2018-03-25 ENCOUNTER — Observation Stay (HOSPITAL_COMMUNITY)
Admission: EM | Admit: 2018-03-25 | Discharge: 2018-03-27 | Disposition: A | Discharge status: Home / Self Care | Payer: Federal, State, Local not specified - PPO | Attending: Internal Medicine | Admitting: Internal Medicine

## 2018-03-25 ENCOUNTER — Encounter (HOSPITAL_COMMUNITY): Payer: Self-pay | Admitting: Emergency Medicine

## 2018-03-25 ENCOUNTER — Other Ambulatory Visit: Payer: Self-pay

## 2018-03-25 DIAGNOSIS — M19072 Primary osteoarthritis, left ankle and foot: Secondary | ICD-10-CM | POA: Diagnosis not present

## 2018-03-25 DIAGNOSIS — D72829 Elevated white blood cell count, unspecified: Secondary | ICD-10-CM | POA: Insufficient documentation

## 2018-03-25 DIAGNOSIS — Z8371 Family history of colonic polyps: Secondary | ICD-10-CM | POA: Insufficient documentation

## 2018-03-25 DIAGNOSIS — R011 Cardiac murmur, unspecified: Secondary | ICD-10-CM | POA: Diagnosis not present

## 2018-03-25 DIAGNOSIS — Z86711 Personal history of pulmonary embolism: Secondary | ICD-10-CM | POA: Diagnosis present

## 2018-03-25 DIAGNOSIS — H9193 Unspecified hearing loss, bilateral: Secondary | ICD-10-CM | POA: Diagnosis not present

## 2018-03-25 DIAGNOSIS — K8020 Calculus of gallbladder without cholecystitis without obstruction: Secondary | ICD-10-CM | POA: Diagnosis not present

## 2018-03-25 DIAGNOSIS — K429 Umbilical hernia without obstruction or gangrene: Secondary | ICD-10-CM | POA: Insufficient documentation

## 2018-03-25 DIAGNOSIS — R188 Other ascites: Secondary | ICD-10-CM | POA: Diagnosis not present

## 2018-03-25 DIAGNOSIS — Z888 Allergy status to other drugs, medicaments and biological substances status: Secondary | ICD-10-CM | POA: Insufficient documentation

## 2018-03-25 DIAGNOSIS — Z833 Family history of diabetes mellitus: Secondary | ICD-10-CM | POA: Insufficient documentation

## 2018-03-25 DIAGNOSIS — E785 Hyperlipidemia, unspecified: Secondary | ICD-10-CM | POA: Diagnosis not present

## 2018-03-25 DIAGNOSIS — Z803 Family history of malignant neoplasm of breast: Secondary | ICD-10-CM | POA: Diagnosis not present

## 2018-03-25 DIAGNOSIS — I81 Portal vein thrombosis: Secondary | ICD-10-CM | POA: Diagnosis not present

## 2018-03-25 DIAGNOSIS — M5137 Other intervertebral disc degeneration, lumbosacral region: Secondary | ICD-10-CM | POA: Diagnosis not present

## 2018-03-25 DIAGNOSIS — Z9049 Acquired absence of other specified parts of digestive tract: Secondary | ICD-10-CM | POA: Insufficient documentation

## 2018-03-25 DIAGNOSIS — K219 Gastro-esophageal reflux disease without esophagitis: Secondary | ICD-10-CM | POA: Insufficient documentation

## 2018-03-25 DIAGNOSIS — I700 Atherosclerosis of aorta: Secondary | ICD-10-CM | POA: Insufficient documentation

## 2018-03-25 DIAGNOSIS — R109 Unspecified abdominal pain: Secondary | ICD-10-CM | POA: Diagnosis not present

## 2018-03-25 DIAGNOSIS — Z800 Family history of malignant neoplasm of digestive organs: Secondary | ICD-10-CM | POA: Insufficient documentation

## 2018-03-25 DIAGNOSIS — Z7901 Long term (current) use of anticoagulants: Secondary | ICD-10-CM | POA: Diagnosis not present

## 2018-03-25 DIAGNOSIS — R1310 Dysphagia, unspecified: Secondary | ICD-10-CM | POA: Diagnosis not present

## 2018-03-25 DIAGNOSIS — K5730 Diverticulosis of large intestine without perforation or abscess without bleeding: Secondary | ICD-10-CM | POA: Insufficient documentation

## 2018-03-25 DIAGNOSIS — Z9889 Other specified postprocedural states: Secondary | ICD-10-CM | POA: Diagnosis present

## 2018-03-25 DIAGNOSIS — J45909 Unspecified asthma, uncomplicated: Secondary | ICD-10-CM | POA: Insufficient documentation

## 2018-03-25 DIAGNOSIS — Z8249 Family history of ischemic heart disease and other diseases of the circulatory system: Secondary | ICD-10-CM | POA: Insufficient documentation

## 2018-03-25 DIAGNOSIS — R1084 Generalized abdominal pain: Secondary | ICD-10-CM | POA: Diagnosis not present

## 2018-03-25 DIAGNOSIS — Z90710 Acquired absence of both cervix and uterus: Secondary | ICD-10-CM | POA: Diagnosis not present

## 2018-03-25 DIAGNOSIS — Z980 Intestinal bypass and anastomosis status: Secondary | ICD-10-CM | POA: Insufficient documentation

## 2018-03-25 DIAGNOSIS — Z87891 Personal history of nicotine dependence: Secondary | ICD-10-CM | POA: Insufficient documentation

## 2018-03-25 DIAGNOSIS — Z8049 Family history of malignant neoplasm of other genital organs: Secondary | ICD-10-CM | POA: Insufficient documentation

## 2018-03-25 DIAGNOSIS — Z86718 Personal history of other venous thrombosis and embolism: Secondary | ICD-10-CM | POA: Diagnosis not present

## 2018-03-25 DIAGNOSIS — Z820 Family history of epilepsy and other diseases of the nervous system: Secondary | ICD-10-CM | POA: Insufficient documentation

## 2018-03-25 LAB — CBC WITH DIFFERENTIAL/PLATELET
BASOS ABS: 0 10*3/uL (ref 0.0–0.1)
Basophils Relative: 0 %
EOS ABS: 0 10*3/uL (ref 0.0–0.7)
EOS PCT: 0 %
HCT: 35.9 % — ABNORMAL LOW (ref 36.0–46.0)
Hemoglobin: 12.6 g/dL (ref 12.0–15.0)
LYMPHS ABS: 2.9 10*3/uL (ref 0.7–4.0)
LYMPHS PCT: 23 %
MCH: 31.7 pg (ref 26.0–34.0)
MCHC: 35.1 g/dL (ref 30.0–36.0)
MCV: 90.2 fL (ref 78.0–100.0)
Monocytes Absolute: 1.1 10*3/uL — ABNORMAL HIGH (ref 0.1–1.0)
Monocytes Relative: 9 %
Neutro Abs: 8.4 10*3/uL — ABNORMAL HIGH (ref 1.7–7.7)
Neutrophils Relative %: 68 %
PLATELETS: 316 10*3/uL (ref 150–400)
RBC: 3.98 MIL/uL (ref 3.87–5.11)
RDW: 12.7 % (ref 11.5–15.5)
WBC: 12.4 10*3/uL — AB (ref 4.0–10.5)

## 2018-03-25 LAB — URINALYSIS, ROUTINE W REFLEX MICROSCOPIC
BACTERIA UA: NONE SEEN
Bilirubin Urine: NEGATIVE
Glucose, UA: NEGATIVE mg/dL
Ketones, ur: 80 mg/dL — AB
LEUKOCYTES UA: NEGATIVE
NITRITE: NEGATIVE
PROTEIN: 30 mg/dL — AB
Specific Gravity, Urine: 1.02 (ref 1.005–1.030)
pH: 6 (ref 5.0–8.0)

## 2018-03-25 LAB — COMPREHENSIVE METABOLIC PANEL
ALT: 16 U/L (ref 14–54)
AST: 14 U/L — AB (ref 15–41)
Albumin: 3 g/dL — ABNORMAL LOW (ref 3.5–5.0)
Alkaline Phosphatase: 96 U/L (ref 38–126)
Anion gap: 12 (ref 5–15)
BUN: 7 mg/dL (ref 6–20)
CHLORIDE: 103 mmol/L (ref 101–111)
CO2: 21 mmol/L — ABNORMAL LOW (ref 22–32)
Calcium: 9 mg/dL (ref 8.9–10.3)
Creatinine, Ser: 0.79 mg/dL (ref 0.44–1.00)
GFR calc Af Amer: >60 mL/min (ref >60)
Glucose, Bld: 111 mg/dL — ABNORMAL HIGH (ref 65–99)
POTASSIUM: 3.7 mmol/L (ref 3.5–5.1)
SODIUM: 136 mmol/L (ref 135–145)
Total Bilirubin: 0.9 mg/dL (ref 0.3–1.2)
Total Protein: 7.5 g/dL (ref 6.5–8.1)

## 2018-03-25 LAB — I-STAT CG4 LACTIC ACID, ED: LACTIC ACID, VENOUS: 1.17 mmol/L (ref 0.5–1.9)

## 2018-03-25 NOTE — ED Triage Notes (Signed)
Pt had a robotic hiatal hernia repair with Nissen fundoplication by Dr Derrell Lolling on May 1st  Pt states on Saturday she started having pain in her abdomen and her back  Pt states the pain has progressively been getting worse  Pt states she has been taking Miralax and her last BM was on Saturday  Pt adds that her urine feels hot when she goes and is dark in color

## 2018-03-26 ENCOUNTER — Encounter (HOSPITAL_COMMUNITY): Payer: Self-pay

## 2018-03-26 ENCOUNTER — Emergency Department (HOSPITAL_COMMUNITY): Payer: Federal, State, Local not specified - PPO

## 2018-03-26 DIAGNOSIS — D72829 Elevated white blood cell count, unspecified: Secondary | ICD-10-CM | POA: Diagnosis not present

## 2018-03-26 DIAGNOSIS — I81 Portal vein thrombosis: Secondary | ICD-10-CM | POA: Diagnosis not present

## 2018-03-26 DIAGNOSIS — Z7901 Long term (current) use of anticoagulants: Secondary | ICD-10-CM | POA: Diagnosis not present

## 2018-03-26 DIAGNOSIS — Z980 Intestinal bypass and anastomosis status: Secondary | ICD-10-CM | POA: Diagnosis not present

## 2018-03-26 DIAGNOSIS — R109 Unspecified abdominal pain: Secondary | ICD-10-CM | POA: Diagnosis not present

## 2018-03-26 DIAGNOSIS — Z86711 Personal history of pulmonary embolism: Secondary | ICD-10-CM

## 2018-03-26 DIAGNOSIS — Z9889 Other specified postprocedural states: Secondary | ICD-10-CM | POA: Diagnosis not present

## 2018-03-26 LAB — PROTIME-INR
INR: 1.4
Prothrombin Time: 17.1 seconds — ABNORMAL HIGH (ref 11.4–15.2)

## 2018-03-26 LAB — APTT: APTT: 34 s (ref 24–36)

## 2018-03-26 MED ORDER — ONDANSETRON HCL 4 MG/2ML IJ SOLN
4.0000 mg | Freq: Once | INTRAMUSCULAR | Status: AC
  Administered 2018-03-26: 4 mg via INTRAVENOUS
  Filled 2018-03-26: qty 2

## 2018-03-26 MED ORDER — FENTANYL CITRATE (PF) 100 MCG/2ML IJ SOLN
50.0000 ug | Freq: Once | INTRAMUSCULAR | Status: AC
  Administered 2018-03-26: 50 ug via INTRAVENOUS
  Filled 2018-03-26: qty 2

## 2018-03-26 MED ORDER — TRAMADOL HCL 50 MG PO TABS
50.0000 mg | ORAL_TABLET | Freq: Four times a day (QID) | ORAL | Status: DC | PRN
Start: 2018-03-26 — End: 2018-03-27
  Administered 2018-03-26 – 2018-03-27 (×2): 50 mg via ORAL
  Filled 2018-03-26 (×2): qty 1

## 2018-03-26 MED ORDER — IOPAMIDOL (ISOVUE-300) INJECTION 61%
INTRAVENOUS | Status: AC
  Filled 2018-03-26: qty 100

## 2018-03-26 MED ORDER — POLYETHYLENE GLYCOL 3350 17 G PO PACK
17.0000 g | PACK | Freq: Every day | ORAL | Status: DC
  Administered 2018-03-26 – 2018-03-27 (×2): 17 g via ORAL
  Filled 2018-03-26 (×2): qty 1

## 2018-03-26 MED ORDER — ENOXAPARIN SODIUM 100 MG/ML ~~LOC~~ SOLN
90.0000 mg | Freq: Once | SUBCUTANEOUS | Status: AC
Start: 2018-03-26 — End: 2018-03-26
  Administered 2018-03-26: 90 mg via SUBCUTANEOUS
  Filled 2018-03-26: qty 0.9

## 2018-03-26 MED ORDER — HYDROCODONE-ACETAMINOPHEN 5-325 MG PO TABS
1.0000 | ORAL_TABLET | Freq: Four times a day (QID) | ORAL | Status: DC | PRN
  Administered 2018-03-26 – 2018-03-27 (×3): 1 via ORAL
  Filled 2018-03-26 (×3): qty 1

## 2018-03-26 MED ORDER — APIXABAN 5 MG PO TABS: 5.0000 mg | ORAL_TABLET | Freq: Two times a day (BID) | ORAL | Status: DC

## 2018-03-26 MED ORDER — SODIUM CHLORIDE 0.9 % IV BOLUS
1000.0000 mL | Freq: Once | INTRAVENOUS | Status: AC
  Administered 2018-03-26: 1000 mL via INTRAVENOUS

## 2018-03-26 MED ORDER — IOPAMIDOL (ISOVUE-300) INJECTION 61%
100.0000 mL | Freq: Once | INTRAVENOUS | Status: AC | PRN
  Administered 2018-03-26: 100 mL via INTRAVENOUS

## 2018-03-26 MED ORDER — SODIUM CHLORIDE 0.9% FLUSH
3.0000 mL | Freq: Two times a day (BID) | INTRAVENOUS | Status: DC
  Administered 2018-03-26 – 2018-03-27 (×3): 3 mL via INTRAVENOUS

## 2018-03-26 MED ORDER — APIXABAN 5 MG PO TABS
10.0000 mg | ORAL_TABLET | Freq: Two times a day (BID) | ORAL | Status: DC
  Administered 2018-03-26 – 2018-03-27 (×3): 10 mg via ORAL
  Filled 2018-03-26 (×3): qty 2

## 2018-03-26 MED ORDER — IOHEXOL 300 MG/ML  SOLN
30.0000 mL | Freq: Once | INTRAMUSCULAR | Status: AC | PRN
  Administered 2018-03-26: 30 mL via ORAL

## 2018-03-26 NOTE — ED Provider Notes (Signed)
[facility]-EMERGENCY DEPT Provider Note   CSN: [id number] Arrival date & time: 03/25/18  1940  Time seen 12:09 AM   History   Chief Complaint Chief Complaint  Patient presents with  . Post-op Problem    HPI Whitney Peters is a 57 y.o. female.  HPI patient had a Niesen fundoplication and hiatal hernia repair done by Dr. Derrell Lolling on May 1.  She states she was doing well until Saturday, May 18 when she started having abdominal discomfort and could not get comfortable.  She states eating and walking makes the pain worse, nothing makes it feel better.  She has tried Ultram, heat, and Tiger balm.  She states the pain is diffuse and constant and radiates to both sides of her back.  She states the back is worse than the abdominal area.  She has had mild nausea without vomiting, she denies diarrhea, her last bowel movement was 2 days ago was slightly firmer than usual.  She has mild bloating.  She states she took MiraLAX yesterday and today without relief.  She is unsure if fever and thinks her urinary output is less.  She has an appointment to see her surgeon on the 22nd.  PCP Lenell Antu, DO Surgery Dr Derrell Lolling  Past Medical History:  Diagnosis Date  . Allergy    seasonal  . Anticoagulated on warfarin    off since 2015  . Arthritis    ankle- left   . Asthma    no problems in years, rarely uses inhaler  . Clotting disorder (HCC)    DVT,PE  . DVT (deep venous thrombosis) (HCC)    left leg in 2014;  no DVT now    . Family history of breast cancer   . GERD (gastroesophageal reflux disease)   . Heart murmur    pt denies  . Hyperlipidemia   . PE (pulmonary embolism) 04/2014   gone   . Ruptured or perforated eardrum, bilateral   . Uses hearing aid    bilateral  . Wears partial dentures    upper dentures and lower partial    Patient Active Problem List   Diagnosis Date Noted  . Portal vein thrombosis 03/26/2018  . S/P Nissen fundoplication (without  gastrostomy tube) procedure 03/06/2018  . Dysphagia   . Family history of breast cancer   . Esophageal stricture 08/28/2016  . Wrist fracture 12/02/2014  . 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 GERD (gastroesophageal reflux disease) 01/22/2014  . Hyperlipidemia LDL goal < 100 01/22/2014  . Hx pulmonary embolism 01/22/2014  . Vitamin D deficiency 01/22/2014    Past Surgical History:  Procedure Laterality Date  . 24 HOUR PH STUDY N/A 01/02/2018   Procedure: 24 HOUR PH STUDY;  Surgeon: Napoleon Form, MD;  Location: [facility];  Service: Endoscopy;  Laterality: N/A;  . ABDOMINAL HYSTERECTOMY    . APPENDECTOMY    . COLONOSCOPY    . ESOPHAGEAL MANOMETRY N/A 01/02/2018   Procedure: ESOPHAGEAL MANOMETRY (EM);  Surgeon: Napoleon Form, MD;  Location: [facility];  Service: Endoscopy;  Laterality: N/A;  . ESOPHAGOGASTRODUODENOSCOPY (EGD) WITH ESOPHAGEAL DILATION     x6  . FOOT SURGERY Bilateral    BONE SPURS  . KNEE SURGERY Right 2011   arthroscopy ; in [place]   . OPEN REDUCTION INTERNAL FIXATION (ORIF) DISTAL RADIAL FRACTURE Right 12/02/2014   Procedure: OPEN REDUCTION INTERNAL FIXATION (ORIF) DISTAL RADIAL FRACTURE;  Surgeon: Veverly Fells  Ophelia Charter, MD;  Location: [facility] OR;  Service: Orthopedics;  Laterality: Right;  . UPPER GASTROINTESTINAL ENDOSCOPY    . WRIST SURGERY     "broke radius bone and theres plates and screws there now ;  Dr Ophelia Charter did"      OB History   None      Home Medications    Prior to Admission medications   Medication Sig Start Date End Date Taking? Authorizing Provider  traMADol (ULTRAM) 50 MG tablet Take 1 tablet (50 mg total) by mouth every 6 (six) hours as needed. Patient taking differently: Take 50 mg by mouth every 6 (six) hours as needed for moderate pain or severe pain.  03/08/18 03/08/19 Yes Axel Filler, MD    Family History Family History  Problem Relation Age of Onset  . Heart disease Mother     . Dementia Mother        deceased 37  . Heart disease Father        PACEMAKER  . Cancer Father        penile cancer; currently 73  . Breast cancer Sister 62       currently 11  . Breast cancer Maternal Grandmother 71       deceased 74s  . Diabetes Maternal Grandmother   . Esophageal cancer Maternal Uncle   . Colon cancer Neg Hx   . Colon polyps Neg Hx   . Rectal cancer Neg Hx   . Stomach cancer Neg Hx     Social History Social History   Tobacco Use  . Smoking status: Former Smoker    Packs/day: 20.00    Types: Cigarettes    Last attempt to quit: 11/07/1991    Years since quitting: 26.4  . Smokeless tobacco: Never Used  Substance Use Topics  . Alcohol use: No    Alcohol/week: 0.0 oz  . Drug use: No  lives at home Lives with spouse Unemployed (when I ask her what kind of work she states 2%, husband states like milk. Erie Noe thinks she is just tired from not sleeping the past 2 days.    Allergies   Xarelto [rivaroxaban] and Pradaxa [dabigatran etexilate mesylate]   Review of Systems Review of Systems  All other systems reviewed and are negative.    Physical Exam Updated Vital Signs BP 122/70 (BP Location: Right Arm)   Temp 98.8 F (37.1 C) (Oral)   Resp (!) 21   SpO2 99%   Vital signs normal    Physical Exam  Constitutional: She is oriented to person, place, and time. She appears well-developed and well-nourished.  Non-toxic appearance. She does not appear ill. No distress.  HENT:  Head: Normocephalic and atraumatic.  Right Ear: External ear normal.  Left Ear: External ear normal.  Nose: Nose normal. No mucosal edema or rhinorrhea.  Mouth/Throat: Oropharynx is clear and moist and mucous membranes are normal. No dental abscesses or uvula swelling.  Eyes: Pupils are equal, round, and reactive to light. Conjunctivae and EOM are normal.  Neck: Normal range of motion and full passive range of motion without pain. Neck supple.  Cardiovascular: Normal rate,  regular rhythm and normal heart sounds. Exam reveals no gallop and no friction rub.  No murmur heard. Pulmonary/Chest: Effort normal and breath sounds normal. No respiratory distress. She has no wheezes. She has no rhonchi. She has no rales. She exhibits no tenderness and no crepitus.  Abdominal: Soft. Normal appearance and bowel sounds are normal. She exhibits no distension. There is  generalized tenderness. There is no rebound and no guarding.  Well-healing surgical scars  Musculoskeletal: Normal range of motion. She exhibits no edema or tenderness.  Moves all extremities well.   Neurological: She is alert and oriented to person, place, and time. She has normal strength. No cranial nerve deficit.  Skin: Skin is warm, dry and intact. No rash noted. No erythema. No pallor.  Psychiatric: She has a normal mood and affect. Her speech is normal and behavior is normal. Her mood appears not anxious.  Nursing note and vitals reviewed.    ED Treatments / Results  Labs (all labs ordered are listed, but only abnormal results are displayed) Results for orders placed or performed during the hospital encounter of 03/25/18  Comprehensive metabolic panel  Result Value Ref Range   Sodium 136 135 - 145 mmol/L   Potassium 3.7 3.5 - 5.1 mmol/L   Chloride 103 101 - 111 mmol/L   CO2 21 (L) 22 - 32 mmol/L   Glucose, Bld 111 (H) 65 - 99 mg/dL   BUN 7 6 - 20 mg/dL   Creatinine, Ser 1.61 0.44 - 1.00 mg/dL   Calcium 9.0 8.9 - 09.6 mg/dL   Total Protein 7.5 6.5 - 8.1 g/dL   Albumin 3.0 (L) 3.5 - 5.0 g/dL   AST 14 (L) 15 - 41 U/L   ALT 16 14 - 54 U/L   Alkaline Phosphatase 96 38 - 126 U/L   Total Bilirubin 0.9 0.3 - 1.2 mg/dL   GFR calc non Af Amer >60 >60 mL/min   GFR calc Af Amer >60 >60 mL/min   Anion gap 12 5 - 15  CBC with Differential  Result Value Ref Range   WBC 12.4 (H) 4.0 - 10.5 K/uL   RBC 3.98 3.87 - 5.11 MIL/uL   Hemoglobin 12.6 12.0 - 15.0 g/dL   HCT 04.5 (L) 40.9 - 81.1 %   MCV 90.2 78.0  - 100.0 fL   MCH 31.7 26.0 - 34.0 pg   MCHC 35.1 30.0 - 36.0 g/dL   RDW 91.4 78.2 - 95.6 %   Platelets 316 150 - 400 K/uL   Neutrophils Relative % 68 %   Neutro Abs 8.4 (H) 1.7 - 7.7 K/uL   Lymphocytes Relative 23 %   Lymphs Abs 2.9 0.7 - 4.0 K/uL   Monocytes Relative 9 %   Monocytes Absolute 1.1 (H) 0.1 - 1.0 K/uL   Eosinophils Relative 0 %   Eosinophils Absolute 0.0 0.0 - 0.7 K/uL   Basophils Relative 0 %   Basophils Absolute 0.0 0.0 - 0.1 K/uL  Urinalysis, Routine w reflex microscopic  Result Value Ref Range   Color, Urine AMBER (A) YELLOW   APPearance CLEAR CLEAR   Specific Gravity, Urine 1.020 1.005 - 1.030   pH 6.0 5.0 - 8.0   Glucose, UA NEGATIVE NEGATIVE mg/dL   Hgb urine dipstick MODERATE (A) NEGATIVE   Bilirubin Urine NEGATIVE NEGATIVE   Ketones, ur 80 (A) NEGATIVE mg/dL   Protein, ur 30 (A) NEGATIVE mg/dL   Nitrite NEGATIVE NEGATIVE   Leukocytes, UA NEGATIVE NEGATIVE   RBC / HPF 0-5 0 - 5 RBC/hpf   WBC, UA 0-5 0 - 5 WBC/hpf   Bacteria, UA NONE SEEN NONE SEEN   Squamous Epithelial / LPF 0-5 0 - 5   Mucus PRESENT   I-Stat CG4 Lactic Acid, ED  Result Value Ref Range   Lactic Acid, Venous 1.17 0.5 - 1.9 mmol/L  EKG None  Radiology Ct Abdomen Pelvis W Contrast  Result Date: 03/26/2018 CLINICAL DATA:  Increasing upper abdominal pain for 2 days. Status post Nissen fundoplication 3 weeks ago. EXAM: CT ABDOMEN AND PELVIS WITH CONTRAST TECHNIQUE: Multidetector CT imaging of the abdomen and pelvis was performed using the standard protocol following bolus administration of intravenous contrast. CONTRAST:  ISOVUE-300 IOPAMIDOL (ISOVUE-300) INJECTION 61% COMPARISON:  None. FINDINGS: Lower chest: Basilar atelectasis, left greater than right. Hepatobiliary: Findings suspicious for partial portal vein thrombosis with low density within the intrahepatic right portal vein, extending to the extrahepatic main portal vein and possibly superior mesenteric veins. Geographic  low-density involving the left lobe of the liver. No discrete focal hepatic lesion. Calcified gallstone within the gallbladder without abnormal gallbladder distention. Pancreas: Fat stranding about the body and neck of the pancreas is likely centered on the mesenteric and portal vein rather than primary pancreatic inflammation. No pancreatic ductal dilatation. Spleen: Normal in size without focal abnormality. Adrenals/Urinary Tract: Normal adrenal glands. No hydronephrosis or perinephric edema. Homogeneous renal enhancement with symmetric excretion on delayed phase imaging. Urinary bladder is physiologically distended without wall thickening. Stomach/Bowel: Post Nissen fundoplication without evidence of postoperative complication. Stomach physiologically distended. Mild stranding about the third portion of the duodenum appears centered more on the mesenteric veins rather than duodenal inflammation. No small bowel wall thickening, inflammatory change or obstruction. Multifocal colonic diverticulosis, prominent in the distal descending and sigmoid colon without diverticulitis. Appendix not visualized, history of appendectomy. No colonic inflammation or wall thickening. Vascular/Lymphatic: Low-density filling defect in the right portal vein and extrahepatic main portal vein. Adjacent soft tissue stranding tracks along the extrahepatic portal vein and superior mesenteric vein which appears expanded. Jejunal branches of the superior mesenteric vein also are prominent size without definite intraluminal filling defect, there is mesenteric edema tracking along the central mesentery. Decreased attenuation of the splenic vein at the porta splenic confluence. Aortic atherosclerosis without aneurysm. Small central mesenteric nodes are likely reactive. Reproductive: Status post hysterectomy. No adnexal masses. Other: Small to moderate pelvic ascites. No free air. No intra-abdominal abscess. Small fat containing umbilical hernia.  Laparoscopic port sites in the anterior abdominal wall. Musculoskeletal: There are no acute or suspicious osseous abnormalities. Degenerative disc disease at L5-S1. IMPRESSION: 1. 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 Superior mesenteric vein also appears expanded with adjacent stranding and may be occluded versus congested. No bowel inflammation or evidence of bowel ischemia. 2. Geographic decreased density of the left hepatic lobe compared to right may be vascular or steatosis. 3. Diffuse colonic diverticulosis without diverticulitis. 4. Post Nissen fundoplication without postoperative complication. 5. Gallstone without gallbladder inflammation. These results were called by telephone at the time of interpretation on 03/26/2018 at 3:25 am to Dr. Devoria Albe , who verbally acknowledged these results. Electronically Signed   By: Rubye Oaks M.D.   On: 03/26/2018 03:26      Dg Esophagus W/water Sol Cm  Result Date: 03/07/2018 CLINICAL DATA:  Postop day 1 Nissen fundoplication MPRESSION: Postop Nissen fall Nissen fundoplication with edema in the gastric fundus. There is narrowing of the distal esophagus with distended esophagus. Negative for obstruction or leak. Electronically Signed   By: Marlan Palau M.D.   On: 03/07/2018 10:06    Procedures .Critical Care Performed by: Devoria Albe, MD Authorized by: Devoria Albe, MD   Critical care provider statement:    Critical care time (minutes):  31  Critical care was necessary to treat or prevent imminent or life-threatening deterioration of the following conditions: Possible post operative obstruction.   Critical care was time spent personally by me on the following activities:  Discussions with consultants, examination of patient, obtaining history from patient or surrogate, ordering and review of laboratory studies, ordering and review of  radiographic studies and re-evaluation of patient's condition   (including critical care time)  Medications Ordered in ED Medications  iopamidol (ISOVUE-300) 61 % injection (has no administration in time range)  enoxaparin (LOVENOX) injection 90 mg (has no administration in time range)  sodium chloride 0.9 % bolus 1,000 mL (0 mLs Intravenous Stopped 03/26/18 0233)  sodium chloride 0.9 % bolus 1,000 mL (0 mLs Intravenous Stopped 03/26/18 0233)  ondansetron (ZOFRAN) injection 4 mg (4 mg Intravenous Given 03/26/18 0043)  iohexol (OMNIPAQUE) 300 MG/ML solution 30 mL (30 mLs Oral Contrast Given 03/26/18 0039)  iopamidol (ISOVUE-300) 61 % injection 100 mL (100 mLs Intravenous Contrast Given 03/26/18 0242)  fentaNYL (SUBLIMAZE) injection 50 mcg (50 mcg Intravenous Given 03/26/18 0330)     Initial Impression / Assessment and Plan / ED Course  I have reviewed the triage vital signs and the nursing notes.  Pertinent labs & imaging results that were available during my care of the patient were reviewed by me and considered in my medical decision making (see chart for details).    Pt was given IV fluids and IV nausea medications. CT AP was done to look for any post-op complications other than constipation.   03:24 AM Radiologist called her CT results.  She was started on Lovenox for anticoagulation.  I went back to talk to the patient and she said she is been on Eliquis in the past when she had a PE and a DVT in her leg.  She states she had testing done to check for clotting disorder but those tests were negative before.  We discussed her CT results and patient was started on Lovenox.  Final Clinical Impressions(s) / ED Diagnoses   Final diagnoses:  Portal vein thrombosis    Plan admission  Devoria Albe, MD, Concha Pyo, MD 03/26/18 [phone]

## 2018-03-26 NOTE — Progress Notes (Signed)
ANTICOAGULATION CONSULT NOTE - Initial Consult  Pharmacy Consult for Apixaban Indication: Portal vein thrombosis with history of DVT/PE   Allergies  Allergen Reactions  . Xarelto [Rivaroxaban] Other (See Comments)    nausea  . Pradaxa [Dabigatran Etexilate Mesylate] Nausea Only    Patient Measurements: Height:  (175.3 cm) Weight: 182 lb (82.6 kg) IBW/kg (Calculated) : 66.2 Heparin Dosing Weight:   Vital Signs: Temp: 98.5 F (36.9 C) (05/21 0522) Temp Source: Oral (05/21 0522) BP: 108/63 (05/21 0522) Pulse Rate: 70 (05/21 0522)  Labs: Recent Labs    03/25/18 2019 03/26/18 0337  HGB 12.6  --   HCT 35.9*  --   PLT 316  --   APTT  --  34  LABPROT  --  17.1*  INR  --  1.40  CREATININE 0.79  --     Estimated Creatinine Clearance: 88.1 mL/min (by C-G formula based on SCr of 0.79 mg/dL).   Medical History: Past Medical History:  Diagnosis Date  . Allergy    seasonal  . Anticoagulated on warfarin    off since 2015  . Arthritis    ankle- left   . Asthma    no problems in years, rarely uses inhaler  . Clotting disorder (HCC)    DVT,PE  . DVT (deep venous thrombosis) (HCC)    left leg in 2014;  no DVT now    . Family history of breast cancer   . GERD (gastroesophageal reflux disease)   . Heart murmur    pt denies  . Hyperlipidemia   . PE (pulmonary embolism) 04/2014   gone   . Ruptured or perforated eardrum, bilateral   . Uses hearing aid    bilateral  . Wears partial dentures    upper dentures and lower partial    Medications:  Scheduled:  . apixaban  10 mg Oral BID  . [START ON 04/02/2018] apixaban  5 mg Oral BID  . iopamidol      . sodium chloride flush  3 mL Intravenous Q12H    Assessment: Patient with Portal vein thrombosis with history of DVT/PE With prior single dose of enoxaparin in ED.  Goal of Therapy:  Safe and effective use of apixaban   Plan:  10 mg orally twice daily for the first 7 days of therapy; after 7 days, 5 mg  orally twice daily  Darlina Guys, Jacquenette Shone Crowford 03/26/2018,5:58 AM

## 2018-03-26 NOTE — Discharge Instructions (Signed)
Information on my medicine - ELIQUIS (apixaban)  This medication education was reviewed with me or my healthcare representative as part of my discharge preparation.  The pharmacist that spoke with me during my hospital stay was:  Berkley Harvey, [facility]  Why was Eliquis prescribed for you? Eliquis was prescribed to treat blood clots that may have been found in the veins of your legs (deep vein thrombosis) or in your lungs (pulmonary embolism) and to reduce the risk of them occurring again.  What do You need to know about Eliquis ? The starting dose is 10 mg (two 5 mg tablets) taken TWICE daily for the FIRST SEVEN (7) DAYS, then on Apr 02, 2018  the dose is reduced to ONE 5 mg tablet taken TWICE daily.  Eliquis may be taken with or without food.   Try to take the dose about the same time in the morning and in the evening. If you have difficulty swallowing the tablet whole please discuss with your pharmacist how to take the medication safely.  Take Eliquis exactly as prescribed and DO NOT stop taking Eliquis without talking to the doctor who prescribed the medication.  Stopping may increase your risk of developing a new blood clot.  Refill your prescription before you run out.  After discharge, you should have regular check-up appointments with your healthcare provider that is prescribing your Eliquis.    What do you do if you miss a dose? If a dose of ELIQUIS is not taken at the scheduled time, take it as soon as possible on the same day and twice-daily administration should be resumed. The dose should not be doubled to make up for a missed dose.  Important Safety Information A possible side effect of Eliquis is bleeding. You should call your healthcare provider right away if you experience any of the following: ? Bleeding from an injury or your nose that does not stop. ? Unusual colored urine (red or dark brown) or unusual colored stools (red or black). ? Unusual bruising for  unknown reasons. ? A serious fall or if you hit your head (even if there is no bleeding).  Some medicines may interact with Eliquis and might increase your risk of bleeding or clotting while on Eliquis. To help avoid this, consult your healthcare provider or pharmacist prior to using any new prescription or non-prescription medications, including herbals, vitamins, non-steroidal anti-inflammatory drugs (NSAIDs) and supplements.  This website has more information on Eliquis (apixaban): http://www.eliquis.com/eliquis/home

## 2018-03-26 NOTE — Progress Notes (Signed)
[facility] PROGRESS NOTE    Progress Note  Whitney Peters  JYN:[id number] DOB: [birth year] DOA: 03/25/2018 PCP: Lenell Antu, DO     Brief Narrative:   Whitney Peters is an 57 y.o. female past medical history significant for DVT and PE off anticoagulation who presents with abdominal comfort this started 3 days prior to admission, with nausea and vomiting and pain radiating to her back she was not able to tolerate anything by mouth.  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 It is likely that she will need long life anticoagulation, agree with oral Eliquis. Continue hydrocodone as needed for pain. Relates she has barely tolerating her clear liquid diet will continue her clear liquid diet.  Will advance as tolerated. MiraLAX orally as needed.  Leukocytosis: She has remained afebrile, likely stress demargination.   S/P Nissen fundoplication (without gastrostomy tube) procedure   DVT prophylaxis: eluquis Family Communication:none Disposition Plan/Barrier to D/C: home in 2-3 days Code Status:     Code Status Orders  (From admission, onward)        Start     Ordered   03/26/18 0447  Full code  Continuous     03/26/18 0451    Code Status History    Date Active Date Inactive Code Status Order ID Comments User Context   03/06/2018 1507 03/08/2018 1357 Full Code [id number]  Axel Filler, MD Inpatient   12/02/2014 2051 12/03/2014 1245 Full Code [id number]  Zonia Kief, PA-C Inpatient        IV Access:    Peripheral IV   Procedures and diagnostic studies:   Ct Abdomen Pelvis W Contrast  Result Date: 03/26/2018 CLINICAL DATA:  Increasing upper abdominal pain for 2 days. Status post Nissen fundoplication 3 weeks ago. EXAM: CT ABDOMEN AND PELVIS WITH CONTRAST TECHNIQUE: Multidetector CT imaging of the  abdomen and pelvis was performed using the standard protocol following bolus administration of intravenous contrast. CONTRAST:  ISOVUE-300 IOPAMIDOL (ISOVUE-300) INJECTION 61% COMPARISON:  None. FINDINGS: Lower chest: Basilar atelectasis, left greater than right. Hepatobiliary: Findings suspicious for partial portal vein thrombosis with low density within the intrahepatic right portal vein, extending to the extrahepatic main portal vein and possibly superior mesenteric veins. Geographic low-density involving the left lobe of the liver. No discrete focal hepatic lesion. Calcified gallstone within the gallbladder without abnormal gallbladder distention. Pancreas: Fat stranding about the body and neck of the pancreas is likely centered on the mesenteric and portal vein rather than primary pancreatic inflammation. No pancreatic ductal dilatation. Spleen: Normal in size without focal abnormality. Adrenals/Urinary Tract: Normal adrenal glands. No hydronephrosis or perinephric edema. Homogeneous renal enhancement with symmetric excretion on delayed phase imaging. Urinary bladder is physiologically distended without wall thickening. Stomach/Bowel: Post Nissen fundoplication without evidence of postoperative complication. Stomach physiologically distended. Mild stranding about the third portion of the duodenum appears centered more on the mesenteric veins rather than duodenal inflammation. No small bowel wall thickening, inflammatory change or obstruction. Multifocal colonic diverticulosis, prominent in the distal descending and sigmoid colon without diverticulitis. Appendix not visualized, history of appendectomy. No colonic inflammation or wall thickening. Vascular/Lymphatic: Low-density filling defect in the right portal vein and extrahepatic main portal vein. Adjacent soft tissue stranding tracks along the extrahepatic portal vein and superior mesenteric vein which appears expanded. Jejunal branches of the superior  mesenteric vein also are prominent size  without definite intraluminal filling defect, there is mesenteric edema tracking along the central mesentery. Decreased attenuation of the splenic vein at the porta splenic confluence. Aortic atherosclerosis without aneurysm. Small central mesenteric nodes are likely reactive. Reproductive: Status post hysterectomy. No adnexal masses. Other: Small to moderate pelvic ascites. No free air. No intra-abdominal abscess. Small fat containing umbilical hernia. Laparoscopic port sites in the anterior abdominal wall. Musculoskeletal: There are no acute or suspicious osseous abnormalities. Degenerative disc disease at L5-S1. IMPRESSION: 1. 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 Superior mesenteric vein also appears expanded with adjacent stranding and may be occluded versus congested. No bowel inflammation or evidence of bowel ischemia. 2. Geographic decreased density of the left hepatic lobe compared to right may be vascular or steatosis. 3. Diffuse colonic diverticulosis without diverticulitis. 4. Post Nissen fundoplication without postoperative complication. 5. Gallstone without gallbladder inflammation. These results were called by telephone at the time of interpretation on 03/26/2018 at 3:25 am to Dr. Devoria Albe , who verbally acknowledged these results. Electronically Signed   By: Rubye Oaks M.D.   On: 03/26/2018 03:26     Medical Consultants:    None.  Anti-Infectives:   None  Subjective:    Whitney Peters she relates her abdominal pain is improved, she is still not tolerating her clear liquid diet she relates that when she takes a sip she feels bloated.  Has not had a bowel movement.  Objective:    Vitals:   03/26/18 0130 03/26/18 0436 03/26/18 0452 03/26/18 0522  BP: 126/73 124/76  108/63  Pulse: 79 76  70  Resp: Temp:     98.5 F (36.9 C)  TempSrc:    Oral  SpO2: 99% 99%  94%  Weight:   82.6 kg (182 lb)   Height:    (1.753 m)     Intake/Output Summary (Last 24 hours) at 03/26/2018 0838 Last data filed at 03/26/2018 0233 Gross per 24 hour  Intake 2000 ml  Output -  Net 2000 ml   Filed Weights   03/26/18 0452  Weight: 82.6 kg (182 lb)    Exam: General exam: In no acute distress. Respiratory system: Good air movement and clear to auscultation. Cardiovascular system: S1 & S2 heard, RRR. Gastrointestinal system: Abdomen is soft with epigastric tenderness no rebound or guarding. Central nervous system: Alert and oriented. No focal neurological deficits. Extremities: No pedal edema. Skin: No rashes, lesions or ulcers    Data Reviewed:    Labs: Basic Metabolic Panel: Recent Labs  Lab 03/25/18 2019  NA 136  K 3.7  CL 103  CO2 21*  GLUCOSE 111*  BUN 7  CREATININE 0.79  CALCIUM 9.0   GFR Estimated Creatinine Clearance: 88.1 mL/min (by C-G formula based on SCr of 0.79 mg/dL). Liver Function Tests: Recent Labs  Lab 03/25/18 2019  AST 14*  ALT 16  ALKPHOS 96  BILITOT 0.9  PROT 7.5  ALBUMIN 3.0*   No results for input(s): LIPASE, AMYLASE in the last 168 hours. No results for input(s): AMMONIA in the last 168 hours. Coagulation profile Recent Labs  Lab 03/26/18 0337  INR 1.40    CBC: Recent Labs  Lab 03/25/18 2019  WBC 12.4*  NEUTROABS 8.4*  HGB 12.6  HCT 35.9*  MCV 90.2  PLT 316   Cardiac Enzymes: No results for input(s): CKTOTAL, CKMB, CKMBINDEX, TROPONINI in the last 168  hours. BNP (last 3 results) No results for input(s): PROBNP in the last 8760 hours. CBG: No results for input(s): GLUCAP in the last 168 hours. D-Dimer: No results for input(s): DDIMER in the last 72 hours. Hgb A1c: No results for input(s): HGBA1C in the last 72 hours. Lipid Profile: No results for input(s): CHOL, HDL, LDLCALC, TRIG, CHOLHDL, LDLDIRECT in the last 72 hours. Thyroid  function studies: No results for input(s): TSH, T4TOTAL, T3FREE, THYROIDAB in the last 72 hours.  Invalid input(s): FREET3 Anemia work up: No results for input(s): VITAMINB12, FOLATE, FERRITIN, TIBC, IRON, RETICCTPCT in the last 72 hours. Sepsis Labs: Recent Labs  Lab 03/25/18 2019 03/25/18 2029  WBC 12.4*  --   LATICACIDVEN  --  1.17   Microbiology No results found for this or any previous visit (from the past 240 hour(s)).   Medications:   . apixaban  10 mg Oral BID  . [START ON 04/02/2018] apixaban  5 mg Oral BID  . iopamidol      . sodium chloride flush  3 mL Intravenous Q12H   Continuous Infusions:    LOS: 0 days   Marinda Elk  [facility] Pager [phone]  *Please refer to [email or web address], password [email or web address] to get updated schedule on who will round on this patient, as hospitalists switch teams weekly. If 7PM-7AM, please contact night-coverage at [email or web address], password [email or web address] for any overnight needs.  03/26/2018, 8:38 AM

## 2018-03-26 NOTE — ED Notes (Signed)
ED TO INPATIENT HANDOFF REPORT  Name/Age/Gender Whitney Peters 57 y.o. female  Code Status Code Status History    Date Active Date Inactive Code Status Order ID Comments User Context   03/06/2018 1507 03/08/2018 1357 Full Code [id number]  Ralene Ok, MD Inpatient   12/02/2014 2051 12/03/2014 1245 Full Code [id number]  Benjiman Core, PA-C Inpatient      Home/SNF/Other Home  Chief Complaint Post op pain in abdomen   Level of Care/Admitting Diagnosis ED Disposition    ED Disposition Condition [place]: [facility] [[id number]]  Level of Care: Telemetry [5]  Admit to tele based on following criteria: Complex arrhythmia (Bradycardia/Tachycardia)  Diagnosis: Portal vein thrombosis [452.ICD-9-CM]  Admitting Physician: Norval Morton [[id number]]  Attending Physician: Norval Morton [[id number]]  PT Class (Do Not Modify): Observation [104]  PT Acc Code (Do Not Modify): Observation [10022]       Medical History Past Medical History:  Diagnosis Date  . Allergy    seasonal  . Anticoagulated on warfarin    off since 2015  . Arthritis    ankle- left   . Asthma    no problems in years, rarely uses inhaler  . Clotting disorder ([place])    DVT,PE  . DVT (deep venous thrombosis) ([place])    left leg in 2014;  no DVT now    . Family history of breast cancer   . GERD (gastroesophageal reflux disease)   . Heart murmur    pt denies  . Hyperlipidemia   . PE (pulmonary embolism) 04/2014   gone   . Ruptured or perforated eardrum, bilateral   . Uses hearing aid    bilateral  . Wears partial dentures    upper dentures and lower partial    Allergies Allergies  Allergen Reactions  . Xarelto [Rivaroxaban] Other (See Comments)    nausea  . Pradaxa [Dabigatran Etexilate Mesylate] Nausea Only    IV Location/Drains/Wounds Patient Lines/Drains/Airways Status   Active Line/Drains/Airways    Name:   Placement date:   Placement time:   Site:   Days:    Peripheral IV 03/26/18 Right Antecubital   03/26/18    0042    Antecubital   less than 1   Incision (Closed) 12/02/14 Arm Left   12/02/14    1918     1210   Incision (Closed) 03/06/18 Abdomen Other (Comment)   03/06/18    1307     20   Incision - 6 Ports Abdomen Right;Lateral Right;Medial Umbilicus Left;Medial Left;Lateral Mid;Upper   03/06/18    1215     20          Labs/Imaging Results for orders placed or performed during the hospital encounter of 03/25/18 (from the past 48 hour(s))  Urinalysis, Routine w reflex microscopic     Status: Abnormal   Collection Time: 03/25/18  8:11 PM  Result Value Ref Range   Color, Urine AMBER (A) YELLOW    Comment: BIOCHEMICALS MAY BE AFFECTED BY COLOR   APPearance CLEAR CLEAR   Specific Gravity, Urine 1.020 1.005 - 1.030   pH 6.0 5.0 - 8.0   Glucose, UA NEGATIVE NEGATIVE mg/dL   Hgb urine dipstick MODERATE (A) NEGATIVE   Bilirubin Urine NEGATIVE NEGATIVE   Ketones, ur 80 (A) NEGATIVE mg/dL   Protein, ur 30 (A) NEGATIVE mg/dL   Nitrite NEGATIVE NEGATIVE   Leukocytes, UA NEGATIVE NEGATIVE   RBC / HPF 0-5 0 - 5 RBC/hpf  WBC, UA 0-5 0 - 5 WBC/hpf   Bacteria, UA NONE SEEN NONE SEEN   Squamous Epithelial / LPF 0-5 0 - 5   Mucus PRESENT     Comment: Performed at [facility], [place] [address], [place], [place] [address]  Comprehensive metabolic panel     Status: Abnormal   Collection Time: 03/25/18  8:19 PM  Result Value Ref Range   Sodium 136 135 - 145 mmol/L   Potassium 3.7 3.5 - 5.1 mmol/L   Chloride 103 101 - 111 mmol/L   CO2 21 (L) 22 - 32 mmol/L   Glucose, Bld 111 (H) 65 - 99 mg/dL   BUN 7 6 - 20 mg/dL   Creatinine, Ser 0.79 0.44 - 1.00 mg/dL   Calcium 9.0 8.9 - 10.3 mg/dL   Total Protein 7.5 6.5 - 8.1 g/dL   Albumin 3.0 (L) 3.5 - 5.0 g/dL   AST 14 (L) 15 - 41 U/L   ALT 16 14 - 54 U/L   Alkaline Phosphatase 96 38 - 126 U/L   Total Bilirubin 0.9 0.3 - 1.2 mg/dL   GFR calc non Af Amer >60 >60 mL/min   GFR calc Af  Amer >60 >60 mL/min    Comment: (NOTE) The eGFR has been calculated using the CKD EPI equation. This calculation has not been validated in all clinical situations. eGFR's persistently <60 mL/min signify possible Chronic Kidney Disease.    Anion gap 12 5 - 15    Comment: Performed at [facility], [place] [address], [place], [place] [address]  CBC with Differential     Status: Abnormal   Collection Time: 03/25/18  8:19 PM  Result Value Ref Range   WBC 12.4 (H) 4.0 - 10.5 K/uL   RBC 3.98 3.87 - 5.11 MIL/uL   Hemoglobin 12.6 12.0 - 15.0 g/dL   HCT 35.9 (L) 36.0 - 46.0 %   MCV 90.2 78.0 - 100.0 fL   MCH 31.7 26.0 - 34.0 pg   MCHC 35.1 30.0 - 36.0 g/dL   RDW 12.7 11.5 - 15.5 %   Platelets 316 150 - 400 K/uL   Neutrophils Relative % 68 %   Neutro Abs 8.4 (H) 1.7 - 7.7 K/uL   Lymphocytes Relative 23 %   Lymphs Abs 2.9 0.7 - 4.0 K/uL   Monocytes Relative 9 %   Monocytes Absolute 1.1 (H) 0.1 - 1.0 K/uL   Eosinophils Relative 0 %   Eosinophils Absolute 0.0 0.0 - 0.7 K/uL   Basophils Relative 0 %   Basophils Absolute 0.0 0.0 - 0.1 K/uL    Comment: Performed at [facility], [place] [address], [place], [place] [address]  I-Stat CG4 Lactic Acid, ED     Status: None   Collection Time: 03/25/18  8:29 PM  Result Value Ref Range   Lactic Acid, Venous 1.17 0.5 - 1.9 mmol/L  Protime-INR     Status: Abnormal   Collection Time: 03/26/18  3:37 AM  Result Value Ref Range   Prothrombin Time 17.1 (H) 11.4 - 15.2 seconds   INR 1.40     Comment: Performed at [facility], [place] [address], [place], [place] [address]  APTT     Status: None   Collection Time: 03/26/18  3:37 AM  Result Value Ref Range   aPTT 34 24 - 36 seconds    Comment: Performed at [facility], [place] [address], [place],  [address]   Ct  Abdomen Pelvis W Contrast  Result Date: 03/26/2018 CLINICAL DATA:  Increasing upper abdominal pain for 2 days. Status  post Nissen fundoplication 3 weeks ago. EXAM: CT ABDOMEN AND PELVIS WITH CONTRAST TECHNIQUE: Multidetector CT imaging of the abdomen and pelvis was performed using the standard protocol following bolus administration of intravenous contrast. CONTRAST:  113m ISOVUE-300 IOPAMIDOL (ISOVUE-300) INJECTION 61% COMPARISON:  None. FINDINGS: Lower chest: Basilar atelectasis, left greater than right. Hepatobiliary: Findings suspicious for partial portal vein thrombosis with low density within the intrahepatic right portal vein, extending to the extrahepatic main portal vein and possibly superior mesenteric veins. Geographic low-density involving the left lobe of the liver. No discrete focal hepatic lesion. Calcified gallstone within the gallbladder without abnormal gallbladder distention. Pancreas: Fat stranding about the body and neck of the pancreas is likely centered on the mesenteric and portal vein rather than primary pancreatic inflammation. No pancreatic ductal dilatation. Spleen: Normal in size without focal abnormality. Adrenals/Urinary Tract: Normal adrenal glands. No hydronephrosis or perinephric edema. Homogeneous renal enhancement with symmetric excretion on delayed phase imaging. Urinary bladder is physiologically distended without wall thickening. Stomach/Bowel: Post Nissen fundoplication without evidence of postoperative complication. Stomach physiologically distended. Mild stranding about the third portion of the duodenum appears centered more on the mesenteric veins rather than duodenal inflammation. No small bowel wall thickening, inflammatory change or obstruction. Multifocal colonic diverticulosis, prominent in the distal descending and sigmoid colon without diverticulitis. Appendix not visualized, history of appendectomy. No colonic inflammation or wall thickening. Vascular/Lymphatic: Low-density filling defect in the right portal vein and extrahepatic main portal vein. Adjacent soft tissue stranding  tracks along the extrahepatic portal vein and superior mesenteric vein which appears expanded. Jejunal branches of the superior mesenteric vein also are prominent size without definite intraluminal filling defect, there is mesenteric edema tracking along the central mesentery. Decreased attenuation of the splenic vein at the porta splenic confluence. Aortic atherosclerosis without aneurysm. Small central mesenteric nodes are likely reactive. Reproductive: Status post hysterectomy. No adnexal masses. Other: Small to moderate pelvic ascites. No free air. No intra-abdominal abscess. Small fat containing umbilical hernia. Laparoscopic port sites in the anterior abdominal wall. Musculoskeletal: There are no acute or suspicious osseous abnormalities. Degenerative disc disease at L5-S1. IMPRESSION: 1. 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 Superior mesenteric vein also appears expanded with adjacent stranding and may be occluded versus congested. No bowel inflammation or evidence of bowel ischemia. 2. Geographic decreased density of the left hepatic lobe compared to right may be vascular or steatosis. 3. Diffuse colonic diverticulosis without diverticulitis. 4. Post Nissen fundoplication without postoperative complication. 5. Gallstone without gallbladder inflammation. These results were called by telephone at the time of interpretation on 03/26/2018 at 3:25 am to Dr. IRolland Porter, who verbally acknowledged these results. Electronically Signed   By: MJeb LeveringM.D.   On: 03/26/2018 03:26    Pending Labs Unresulted Labs (From admission, onward)   None      Vitals/Pain Today's Vitals   03/25/18 1958 03/25/18 2009 03/26/18 0130  BP: 122/70  126/73  Pulse:   79  Resp: (!) 21  18  Temp: 98.8 F (37.1 C)    TempSrc: Oral    SpO2: 99%  99%  PainSc:  8      Isolation Precautions No active  isolations  Medications Medications  iopamidol (ISOVUE-300) 61 % injection (has no administration in time range)  enoxaparin (LOVENOX) injection 90 mg (  has no administration in time range)  sodium chloride 0.9 % bolus 1,000 mL (0 mLs Intravenous Stopped 03/26/18 0233)  sodium chloride 0.9 % bolus 1,000 mL (0 mLs Intravenous Stopped 03/26/18 0233)  ondansetron (ZOFRAN) injection 4 mg (4 mg Intravenous Given 03/26/18 0043)  iohexol (OMNIPAQUE) 300 MG/ML solution 30 mL (30 mLs Oral Contrast Given 03/26/18 0039)  iopamidol (ISOVUE-300) 61 % injection 100 mL (100 mLs Intravenous Contrast Given 03/26/18 0242)  fentaNYL (SUBLIMAZE) injection 50 mcg (50 mcg Intravenous Given 03/26/18 0330)    Mobility walks

## 2018-03-26 NOTE — H&P (Signed)
History and Physical    Whitney Peters GNF:[id number] DOB: Sep 23, 1961 DOA: 03/25/2018  Referring MD/NP/PA: Dr. Devoria Albe PCP: Lenell Antu, DO  Patient coming from: home  Chief Complaint: Abdominal pain  I have personally briefly reviewed patient's old medical records in [facility]    HPI: Whitney Peters is a 57 y.o. female with medical history significant of DVT/PE off anticoagulation, hiatal hernia; who presents with complaints of abdominal discomfort for last 3 days.  She describes the pain as "massive" with radiation to both her side and her back.  Eating anything or any kind of movement seem to make pain symptoms worse.  She tried all TRAM and Tiger balm without relief of pain symptoms.  Associated symptoms included nausea and bloating.  Denies any fever, chills, chest pain, cough, vomiting, diarrhea, or significant shortness of breath.  Patient had recent Nelson fundoplication and hernia repair performed by Dr. Derrell Lolling on 5/1.  Initially after procedure patient had been doing well and had been able to advance her diet. Patient reports previous history of pulmonary embolus in September of 2014 and subsequent right lower extremity DVT in 2015 for which she had been on anticoagulation of Eliquis for approximately 1 year.  She reports having a hypercoagulable work-up by oncology performed in 11/12015 that was noted to be negative, and at that time she was told that she did not need to be on long-term anticoagulants.  ED Course: Upon admission into the emergency department patient was noted to be afebrile with vital signs relatively within normal limits.  Work-up revealed WBC 12.4, hemoglobin 12.6, lactic acid 1.17, PT 17.1, INR 1.4, APTT 34, and all other labs relatively within normal limits.  Urinalysis did not show any significant findings concerning for infection.  A CTof the abdomen and pelvis revealed portal vein occlusion without significant signs of bowel inflammation or ischemia.   Patient had been given 2 L of normal saline IV fluids, fentanyl for pain, Zofran, and given empiric treatment dose of Lovenox.  TRH called to admit.   Review of Systems  Constitutional: Negative for chills and fever.  HENT: Negative for ear discharge and nosebleeds.   Eyes: Negative for photophobia and pain.  Respiratory: Negative for cough and shortness of breath.   Cardiovascular: Negative for chest pain and leg swelling.  Gastrointestinal: Positive for abdominal pain and nausea. Negative for diarrhea and vomiting.  Genitourinary: Negative for dysuria and hematuria.  Musculoskeletal: Positive for back pain.  Skin: Negative for itching and rash.  Neurological: Negative for focal weakness and loss of consciousness.  Psychiatric/Behavioral: Negative for substance abuse. The patient is not nervous/anxious.    Past Medical History:  Diagnosis Date  . Allergy    seasonal  . Anticoagulated on warfarin    off since 2015  . Arthritis    ankle- left   . Asthma    no problems in years, rarely uses inhaler  . Clotting disorder (HCC)    DVT,PE  . DVT (deep venous thrombosis) (HCC)    left leg in 2014;  no DVT now    . Family history of breast cancer   . GERD (gastroesophageal reflux disease)   . Heart murmur    pt denies  . Hyperlipidemia   . PE (pulmonary embolism) 04/2014   gone   . Ruptured or perforated eardrum, bilateral   . Uses hearing aid    bilateral  . Wears partial dentures    upper dentures and lower partial    Past  Surgical History:  Procedure Laterality Date  . 24 HOUR PH STUDY N/A 01/02/2018   Procedure: 24 HOUR PH STUDY;  Surgeon: Napoleon Form, MD;  Location: [facility];  Service: Endoscopy;  Laterality: N/A;  . ABDOMINAL HYSTERECTOMY    . APPENDECTOMY    . COLONOSCOPY    . ESOPHAGEAL MANOMETRY N/A 01/02/2018   Procedure: ESOPHAGEAL MANOMETRY (EM);  Surgeon: Napoleon Form, MD;  Location: [facility];  Service: Endoscopy;  Laterality: N/A;  .  ESOPHAGOGASTRODUODENOSCOPY (EGD) WITH ESOPHAGEAL DILATION     x6  . FOOT SURGERY Bilateral    BONE SPURS  . KNEE SURGERY Right 2011   arthroscopy ; in [place]   . OPEN REDUCTION INTERNAL FIXATION (ORIF) DISTAL RADIAL FRACTURE Right 12/02/2014   Procedure: OPEN REDUCTION INTERNAL FIXATION (ORIF) DISTAL RADIAL FRACTURE;  Surgeon: Eldred Manges, MD;  Location: [facility] OR;  Service: Orthopedics;  Laterality: Right;  . UPPER GASTROINTESTINAL ENDOSCOPY    . WRIST SURGERY     "broke radius bone and theres plates and screws there now ;  Dr Ophelia Charter did"      reports that she quit smoking about 26 years ago. Her smoking use included cigarettes. She smoked 20.00 packs per day. She has never used smokeless tobacco. She reports that she does not drink alcohol or use drugs.  Allergies  Allergen Reactions  . Xarelto [Rivaroxaban] Other (See Comments)    nausea  . Pradaxa [Dabigatran Etexilate Mesylate] Nausea Only    Family History  Problem Relation Age of Onset  . Heart disease Mother   . Dementia Mother        deceased 24  . Heart disease Father        PACEMAKER  . Cancer Father        penile cancer; currently 75  . Breast cancer Sister 95       currently 33  . Breast cancer Maternal Grandmother 25       deceased 66s  . Diabetes Maternal Grandmother   . Esophageal cancer Maternal Uncle   . Colon cancer Neg Hx   . Colon polyps Neg Hx   . Rectal cancer Neg Hx   . Stomach cancer Neg Hx     Prior to Admission medications   Medication Sig Start Date End Date Taking? Authorizing Provider  traMADol (ULTRAM) 50 MG tablet Take 1 tablet (50 mg total) by mouth every 6 (six) hours as needed. Patient taking differently: Take 50 mg by mouth every 6 (six) hours as needed for moderate pain or severe pain.  03/08/18 03/08/19 Yes Axel Filler, MD    Physical Exam:  Constitutional: NAD, calm, comfortable Vitals:   03/25/18 1958 03/26/18 0130  BP: 122/70 126/73  Pulse:  79  Resp: (!) 21 18    Temp: 98.8 F (37.1 C)   TempSrc: Oral   SpO2: 99% 99%   Eyes: PERRL, lids and conjunctivae normal ENMT: Mucous membranes are dry.  posterior pharynx clear of any exudate or lesions.  Neck: normal, supple, no masses, no thyromegaly Respiratory: clear to auscultation bilaterally, no wheezing, no crackles. Normal respiratory effort. No accessory muscle use.  Cardiovascular: Regular rate and rhythm, no murmurs / rubs / gallops. No extremity edema. 2+ pedal pulses. No carotid bruits.  Abdomen: Protuberant abdomen with mild generalized tenderness to palpation.    Musculoskeletal: no clubbing / cyanosis. No joint deformity upper and lower extremities. Good ROM, no contractures. Normal muscle tone.  Skin: Healed abdominal scar. no other rashes,  lesions, ulcers. No induration Neurologic: CN 2-12 grossly intact. Sensation intact, DTR normal. Strength 5/5 in all 4.  Psychiatric: Normal judgment and insight. Alert and oriented x 3. Normal mood.     Labs on Admission: I have personally reviewed following labs and imaging studies  CBC: Recent Labs  Lab 03/25/18 2019  WBC 12.4*  NEUTROABS 8.4*  HGB 12.6  HCT 35.9*  MCV 90.2  PLT 316   Basic Metabolic Panel: Recent Labs  Lab 03/25/18 2019  NA 136  K 3.7  CL 103  CO2 21*  GLUCOSE 111*  BUN 7  CREATININE 0.79  CALCIUM 9.0   GFR: CrCl cannot be calculated (Unknown ideal weight.). Liver Function Tests: Recent Labs  Lab 03/25/18 2019  AST 14*  ALT 16  ALKPHOS 96  BILITOT 0.9  PROT 7.5  ALBUMIN 3.0*   No results for input(s): LIPASE, AMYLASE in the last 168 hours. No results for input(s): AMMONIA in the last 168 hours. Coagulation Profile: No results for input(s): INR, PROTIME in the last 168 hours. Cardiac Enzymes: No results for input(s): CKTOTAL, CKMB, CKMBINDEX, TROPONINI in the last 168 hours. BNP (last 3 results) No results for input(s): PROBNP in the last 8760 hours. HbA1C: No results for input(s): HGBA1C in the  last 72 hours. CBG: No results for input(s): GLUCAP in the last 168 hours. Lipid Profile: No results for input(s): CHOL, HDL, LDLCALC, TRIG, CHOLHDL, LDLDIRECT in the last 72 hours. Thyroid Function Tests: No results for input(s): TSH, T4TOTAL, FREET4, T3FREE, THYROIDAB in the last 72 hours. Anemia Panel: No results for input(s): VITAMINB12, FOLATE, FERRITIN, TIBC, IRON, RETICCTPCT in the last 72 hours. Urine analysis:    Component Value Date/Time   COLORURINE AMBER (A) 03/25/2018 2011   APPEARANCEUR CLEAR 03/25/2018 2011   LABSPEC 1.020 03/25/2018 2011   PHURINE 6.0 03/25/2018 2011   GLUCOSEU NEGATIVE 03/25/2018 2011   HGBUR MODERATE (A) 03/25/2018 2011   BILIRUBINUR NEGATIVE 03/25/2018 2011   BILIRUBINUR negative 12/16/2014 1708   KETONESUR 80 (A) 03/25/2018 2011   PROTEINUR 30 (A) 03/25/2018 2011   UROBILINOGEN negative 12/16/2014 1708   NITRITE NEGATIVE 03/25/2018 2011   LEUKOCYTESUR NEGATIVE 03/25/2018 2011   Sepsis Labs: No results found for this or any previous visit (from the past 240 hour(s)).   Radiological Exams on Admission: Ct Abdomen Pelvis W Contrast  Result Date: 03/26/2018 CLINICAL DATA:  Increasing upper abdominal pain for 2 days. Status post Nissen fundoplication 3 weeks ago. EXAM: CT ABDOMEN AND PELVIS WITH CONTRAST TECHNIQUE: Multidetector CT imaging of the abdomen and pelvis was performed using the standard protocol following bolus administration of intravenous contrast. CONTRAST:  ISOVUE-300 IOPAMIDOL (ISOVUE-300) INJECTION 61% COMPARISON:  None. FINDINGS: Lower chest: Basilar atelectasis, left greater than right. Hepatobiliary: Findings suspicious for partial portal vein thrombosis with low density within the intrahepatic right portal vein, extending to the extrahepatic main portal vein and possibly superior mesenteric veins. Geographic low-density involving the left lobe of the liver. No discrete focal hepatic lesion. Calcified gallstone within the  gallbladder without abnormal gallbladder distention. Pancreas: Fat stranding about the body and neck of the pancreas is likely centered on the mesenteric and portal vein rather than primary pancreatic inflammation. No pancreatic ductal dilatation. Spleen: Normal in size without focal abnormality. Adrenals/Urinary Tract: Normal adrenal glands. No hydronephrosis or perinephric edema. Homogeneous renal enhancement with symmetric excretion on delayed phase imaging. Urinary bladder is physiologically distended without wall thickening. Stomach/Bowel: Post Nissen fundoplication without evidence of postoperative complication. Stomach physiologically  distended. Mild stranding about the third portion of the duodenum appears centered more on the mesenteric veins rather than duodenal inflammation. No small bowel wall thickening, inflammatory change or obstruction. Multifocal colonic diverticulosis, prominent in the distal descending and sigmoid colon without diverticulitis. Appendix not visualized, history of appendectomy. No colonic inflammation or wall thickening. Vascular/Lymphatic: Low-density filling defect in the right portal vein and extrahepatic main portal vein. Adjacent soft tissue stranding tracks along the extrahepatic portal vein and superior mesenteric vein which appears expanded. Jejunal branches of the superior mesenteric vein also are prominent size without definite intraluminal filling defect, there is mesenteric edema tracking along the central mesentery. Decreased attenuation of the splenic vein at the porta splenic confluence. Aortic atherosclerosis without aneurysm. Small central mesenteric nodes are likely reactive. Reproductive: Status post hysterectomy. No adnexal masses. Other: Small to moderate pelvic ascites. No free air. No intra-abdominal abscess. Small fat containing umbilical hernia. Laparoscopic port sites in the anterior abdominal wall. Musculoskeletal: There are no acute or suspicious osseous  abnormalities. Degenerative disc disease at L5-S1. IMPRESSION: 1. 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 Superior mesenteric vein also appears expanded with adjacent stranding and may be occluded versus congested. No bowel inflammation or evidence of bowel ischemia. 2. Geographic decreased density of the left hepatic lobe compared to right may be vascular or steatosis. 3. Diffuse colonic diverticulosis without diverticulitis. 4. Post Nissen fundoplication without postoperative complication. 5. Gallstone without gallbladder inflammation. These results were called by telephone at the time of interpretation on 03/26/2018 at 3:25 am to Dr. Devoria Albe , who verbally acknowledged these results. Electronically Signed   By: Rubye Oaks M.D.   On: 03/26/2018 03:26     Assessment/Plan Portal vein thrombosis, history DVT/PE: Patient presents with complaints of epigastric abdominal pain after recent Nelson fundoplication on 5/1.  Reports previous history of PE in 2014 and DVT in 2015 for which patient had been on anticoagulation of Eliquis.  Review of records shows that patient had a negative hypercoagulable work-up for which she was advised to stop anticoagulation in 09/2014 by hematology.  Patient was given treatment dose of Lovenox.  Suspect patient night likely would benefit from lifelong anticoagulation given that this is her third event. - Admit to a telemetry bed - Restart Eliquis per pharmacy - Hydrocodone prn severe pain  Leukocytosis: WBC elevated at 12.4 on admission.  Urinalysis was clear of any signs of acute infection.  Suspect secondary to above. - Continue to monitor   S/p Nissen fundoplication: Patient reports history of hiatal hernia for which he underwent Nissen fundoplication on 5/1 by Dr. Derrell Lolling.  Patient has follow-up scheduled for 5/22. - Continue outpatient follow-up   DVT  prophylaxis: Eliquis to be started Code Status: Full Family Communication: This plan of care with the patient family present at bedside Disposition Plan: Likely discharge home Consults called: None Admission status: Observation  Clydie Braun MD [facility] Pager [phone]   If 7PM-7AM, please contact night-coverage [email or web address] Password [email or web address]  03/26/2018, 3:50 AM

## 2018-03-27 DIAGNOSIS — Z86711 Personal history of pulmonary embolism: Secondary | ICD-10-CM | POA: Diagnosis not present

## 2018-03-27 DIAGNOSIS — I81 Portal vein thrombosis: Secondary | ICD-10-CM | POA: Diagnosis not present

## 2018-03-27 MED ORDER — APIXABAN 5 MG PO TABS: 10.0000 mg | ORAL_TABLET | Freq: Two times a day (BID) | ORAL | 0 refills | Status: DC

## 2018-03-27 MED ORDER — APIXABAN 5 MG PO TABS: 5.0000 mg | ORAL_TABLET | Freq: Two times a day (BID) | ORAL | 2 refills | Status: DC

## 2018-03-27 NOTE — Progress Notes (Signed)
Patient discharge teaching given, including activity, diet, follow-up appoints, and medications. Patient verbalized understanding of all discharge instructions. IV access was d/c'd. Vitals are stable. Skin is intact except as charted in most recent assessments. Pt to be escorted out by NT, to be driven home by family. 

## 2018-03-27 NOTE — Discharge Summary (Signed)
Physician Discharge Summary  Whitney Peters JXB:[id number] DOB: [birth year] DOA: 03/25/2018  PCP: Lenell Antu, DO  Admit date: 03/25/2018 Discharge date: 03/27/2018  Admitted From: home Disposition:  Home  Recommendations for Outpatient Follow-up:  1. Follow up with Oncology in 1-2 weeks 2. Please obtain BMP/CBC in one week   Home Health:no Equipment/Devices: None  Discharge Condition:stable CODE STATUS:full Diet recommendation: Heart Healthy   Brief/Interim Summary: 57 y.o. female past medical history significant for DVT and PE off anticoagulation who presents with abdominal comfort this started 3 days prior to admission, with nausea and vomiting and pain radiating to her back she was not able to tolerate anything by mouth.  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 Her pain was controlled with hydrocodone. On admission she was not able to tolerate anything by mouth after 2 days of anticoagulation she was able to tolerate her diet a week she will go home on tramadol for pain which she has at home and Eliquis.  LeukoCytosis: Likely reactive she remained afebrile Discharge Instructions  Discharge Instructions    Diet - low sodium heart healthy   Complete by:  As directed    Increase activity slowly   Complete by:  As directed      Allergies as of 03/27/2018      Reactions   Xarelto [rivaroxaban] Other (See Comments)   nausea   Pradaxa [dabigatran Etexilate Mesylate] Nausea Only      Medication List    TAKE these  medications   apixaban 5 MG Tabs tablet Commonly known as:  ELIQUIS Take 2 tablets (10 mg total) by mouth 2 (two) times daily.   apixaban 5 MG Tabs tablet Commonly known as:  ELIQUIS Take 1 tablet (5 mg total) by mouth 2 (two) times daily. Start taking on:  04/02/2018   traMADol 50 MG tablet Commonly known as:  ULTRAM Take 1 tablet (50 mg total) by mouth every 6 (six) hours as needed. What changed:  reasons to take this       Allergies  Allergen Reactions  . Xarelto [Rivaroxaban] Other (See Comments)    nausea  . Pradaxa [Dabigatran Etexilate Mesylate] Nausea Only    Consultations:  None   Procedures/Studies: Ct Abdomen Pelvis W Contrast  Result Date: 03/26/2018 CLINICAL DATA:  Increasing upper abdominal pain for 2 days. Status post Nissen fundoplication 3 weeks ago. EXAM: CT ABDOMEN AND PELVIS WITH CONTRAST TECHNIQUE: Multidetector CT imaging of the abdomen and pelvis was performed using the standard protocol following bolus administration of intravenous contrast. CONTRAST:  ISOVUE-300 IOPAMIDOL (ISOVUE-300) INJECTION 61% COMPARISON:  None. FINDINGS: Lower chest: Basilar atelectasis, left greater than right. Hepatobiliary: Findings suspicious for partial portal vein thrombosis with low density within the intrahepatic right portal vein, extending to the extrahepatic main portal vein and possibly superior mesenteric veins. Geographic low-density involving the left lobe of the liver. No discrete focal hepatic lesion. Calcified gallstone within the gallbladder without abnormal gallbladder distention. Pancreas: Fat stranding about the body and neck of the pancreas is likely centered on the mesenteric and portal  vein rather than primary pancreatic inflammation. No pancreatic ductal dilatation. Spleen: Normal in size without focal abnormality. Adrenals/Urinary Tract: Normal adrenal glands. No hydronephrosis or perinephric edema. Homogeneous renal enhancement with symmetric excretion  on delayed phase imaging. Urinary bladder is physiologically distended without wall thickening. Stomach/Bowel: Post Nissen fundoplication without evidence of postoperative complication. Stomach physiologically distended. Mild stranding about the third portion of the duodenum appears centered more on the mesenteric veins rather than duodenal inflammation. No small bowel wall thickening, inflammatory change or obstruction. Multifocal colonic diverticulosis, prominent in the distal descending and sigmoid colon without diverticulitis. Appendix not visualized, history of appendectomy. No colonic inflammation or wall thickening. Vascular/Lymphatic: Low-density filling defect in the right portal vein and extrahepatic main portal vein. Adjacent soft tissue stranding tracks along the extrahepatic portal vein and superior mesenteric vein which appears expanded. Jejunal branches of the superior mesenteric vein also are prominent size without definite intraluminal filling defect, there is mesenteric edema tracking along the central mesentery. Decreased attenuation of the splenic vein at the porta splenic confluence. Aortic atherosclerosis without aneurysm. Small central mesenteric nodes are likely reactive. Reproductive: Status post hysterectomy. No adnexal masses. Other: Small to moderate pelvic ascites. No free air. No intra-abdominal abscess. Small fat containing umbilical hernia. Laparoscopic port sites in the anterior abdominal wall. Musculoskeletal: There are no acute or suspicious osseous abnormalities. Degenerative disc disease at L5-S1. IMPRESSION: 1. 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 Superior mesenteric vein also appears expanded with adjacent stranding and may be occluded versus congested. No bowel inflammation or evidence of bowel ischemia. 2. Geographic decreased density of the left hepatic lobe  compared to right may be vascular or steatosis. 3. Diffuse colonic diverticulosis without diverticulitis. 4. Post Nissen fundoplication without postoperative complication. 5. Gallstone without gallbladder inflammation. These results were called by telephone at the time of interpretation on 03/26/2018 at 3:25 am to Dr. Devoria Albe , who verbally acknowledged these results. Electronically Signed   By: Rubye Oaks M.D.   On: 03/26/2018 03:26   Dg Esophagus W/water Sol Cm  Result Date: 03/07/2018 CLINICAL DATA:  Postop day 1 Nissen fundoplication EXAM: ESOPHOGRAM/BARIUM SWALLOW TECHNIQUE: Single contrast examination was performed using  Isovue 300. FLUOROSCOPY TIME:  Fluoroscopy Time:  3 minutes 12 second Radiation Exposure Index (if provided by the fluoroscopic device): Number of Acquired Spot Images: 0 COMPARISON:  01/23/2018 FINDINGS: Esophagus is diffusely distended. There is narrowing of the distal esophagus at the site of Nissen fundoplication. There is edema in the fundus of the stomach. Negative for leak. Stomach appears normal IMPRESSION: Postop Nissen fall Nissen fundoplication with edema in the gastric fundus. There is narrowing of the distal esophagus with distended esophagus. Negative for obstruction or leak. Electronically Signed   By: Marlan Palau M.D.   On: 03/07/2018 10:06     Subjective: No complaints feels great tolerating her diet in a good mood as her pain is significantly improved.  Discharge Exam: Vitals:   03/27/18 0552 03/27/18 0635  BP: (!) 96/57 111/81  Pulse: (!) 59 73  Resp: 14   Temp: 98 F (36.7 C)   SpO2: 96%    Vitals:   03/26/18 1524 03/26/18 1924 03/27/18 0552 03/27/18 0635  BP: 113/68 102/62 (!) 96/57 111/81  Pulse: 77 65 (!) 59 73  Resp: Temp: 99.1 F (37.3 C) 97.9 F (36.6 C) 98 F (36.7 C)   TempSrc: Oral Oral  Oral   SpO2: 98% 95% 96%   Weight:      Height:        General: Pt is alert, awake, not in acute distress Cardiovascular:  RRR, S1/S2 +, no rubs, no gallops Respiratory: CTA bilaterally, no wheezing, no rhonchi Abdominal: Soft, NT, ND, bowel sounds + Extremities: no edema, no cyanosis    The results of significant diagnostics from this hospitalization (including imaging, microbiology, ancillary and laboratory) are listed below for reference.     Microbiology: No results found for this or any previous visit (from the past 240 hour(s)).   Labs: BNP (last 3 results) No results for input(s): BNP in the last 8760 hours. Basic Metabolic Panel: Recent Labs  Lab 03/25/18 2019  NA 136  K 3.7  CL 103  CO2 21*  GLUCOSE 111*  BUN 7  CREATININE 0.79  CALCIUM 9.0   Liver Function Tests: Recent Labs  Lab 03/25/18 2019  AST 14*  ALT 16  ALKPHOS 96  BILITOT 0.9  PROT 7.5  ALBUMIN 3.0*   No results for input(s): LIPASE, AMYLASE in the last 168 hours. No results for input(s): AMMONIA in the last 168 hours. CBC: Recent Labs  Lab 03/25/18 2019  WBC 12.4*  NEUTROABS 8.4*  HGB 12.6  HCT 35.9*  MCV 90.2  PLT 316   Cardiac Enzymes: No results for input(s): CKTOTAL, CKMB, CKMBINDEX, TROPONINI in the last 168 hours. BNP: Invalid input(s): POCBNP CBG: No results for input(s): GLUCAP in the last 168 hours. D-Dimer No results for input(s): DDIMER in the last 72 hours. Hgb A1c No results for input(s): HGBA1C in the last 72 hours. Lipid Profile No results for input(s): CHOL, HDL, LDLCALC, TRIG, CHOLHDL, LDLDIRECT in the last 72 hours. Thyroid function studies No results for input(s): TSH, T4TOTAL, T3FREE, THYROIDAB in the last 72 hours.  Invalid input(s): FREET3 Anemia work up No results for input(s): VITAMINB12, FOLATE, FERRITIN, TIBC, IRON, RETICCTPCT in the last 72 hours. Urinalysis    Component Value Date/Time   COLORURINE AMBER (A) 03/25/2018 2011   APPEARANCEUR CLEAR 03/25/2018 2011   LABSPEC 1.020 03/25/2018 2011   PHURINE 6.0 03/25/2018 2011   GLUCOSEU NEGATIVE 03/25/2018 2011    HGBUR MODERATE (A) 03/25/2018 2011   BILIRUBINUR NEGATIVE 03/25/2018 2011   BILIRUBINUR negative 12/16/2014 1708   KETONESUR 80 (A) 03/25/2018 2011   PROTEINUR 30 (A) 03/25/2018 2011   UROBILINOGEN negative 12/16/2014 1708   NITRITE NEGATIVE 03/25/2018 2011   LEUKOCYTESUR NEGATIVE 03/25/2018 2011   Sepsis Labs Invalid input(s): PROCALCITONIN,  WBC,  LACTICIDVEN Microbiology No results found for this or any previous visit (from the past 240 hour(s)).   Time coordinating discharge: 35 minutes  SIGNED:   Marinda Elk, MD  [facility] 03/27/2018, 9:07 AM Pager   If 7PM-7AM, please contact night-coverage [email or web address] Password [email or web address]

## 2018-04-09 DIAGNOSIS — Z09 Encounter for follow-up examination after completed treatment for conditions other than malignant neoplasm: Secondary | ICD-10-CM | POA: Diagnosis not present

## 2018-04-09 DIAGNOSIS — E785 Hyperlipidemia, unspecified: Secondary | ICD-10-CM | POA: Diagnosis not present

## 2018-04-09 DIAGNOSIS — I81 Portal vein thrombosis: Secondary | ICD-10-CM | POA: Diagnosis not present

## 2018-04-18 ENCOUNTER — Inpatient Hospital Stay (HOSPITAL_COMMUNITY): Payer: Federal, State, Local not specified - PPO

## 2018-04-18 ENCOUNTER — Encounter (HOSPITAL_COMMUNITY): Payer: Self-pay | Admitting: Hematology

## 2018-04-18 ENCOUNTER — Inpatient Hospital Stay (HOSPITAL_COMMUNITY): Payer: Federal, State, Local not specified - PPO | Attending: Hematology | Admitting: Hematology

## 2018-04-18 ENCOUNTER — Other Ambulatory Visit: Payer: Self-pay

## 2018-04-18 VITALS — Wt 178.5 lb

## 2018-04-18 DIAGNOSIS — Z7901 Long term (current) use of anticoagulants: Secondary | ICD-10-CM | POA: Insufficient documentation

## 2018-04-18 DIAGNOSIS — Z87891 Personal history of nicotine dependence: Secondary | ICD-10-CM

## 2018-04-18 DIAGNOSIS — I81 Portal vein thrombosis: Secondary | ICD-10-CM | POA: Diagnosis not present

## 2018-04-18 DIAGNOSIS — Z86711 Personal history of pulmonary embolism: Secondary | ICD-10-CM | POA: Diagnosis not present

## 2018-04-18 DIAGNOSIS — Z86718 Personal history of other venous thrombosis and embolism: Secondary | ICD-10-CM | POA: Diagnosis not present

## 2018-04-18 DIAGNOSIS — D6859 Other primary thrombophilia: Secondary | ICD-10-CM | POA: Diagnosis not present

## 2018-04-18 NOTE — Progress Notes (Signed)
CONSULT NOTE  Patient Care Team: [name], DO as PCP - General (Family Medicine)  CHIEF COMPLAINTS/PURPOSE OF CONSULTATION:  Portal vein thrombosis.  HISTORY OF PRESENTING ILLNESS:  Whitney Peters 57 y.o. female is seen in consultation today for further work-up and management of portal vein thrombosis.  She was recently admitted to the hospital on 03/25/2018 with severe abdominal pain.  A CT scan on 03/26/2018 after abdomen and pelvis showed partial portal vein thrombus within the intrahepatic right portal vein extending into extrahepatic main portal vein and possibly SMV.  She was started on Lovenox, transition to Eliquis.  She is tolerating it very well.  No family history of blood clots or cancers located.  She had a history of unprovoked pulmonary embolism in August 2014 which was diagnosed in [place], [place].  She was initially tried on Xarelto and Pradaxa which she could not tolerate secondary to nausea.  She was put on warfarin.  While she was on warfarin on 04/10/2014, she developed femoral vein DVT.  She was then switched to Eliquis until end of October 2015.  A hypercoagulable work-up on 09/15/2014 was essentially negative.  She has been off of anticoagulation since then until she developed portal vein thrombosis.  Denies any fevers, night sweats or weight loss.  Denies any vasomotor symptoms or erythromelalgia.  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  MEDICAL HISTORY:  Past Medical History:  Diagnosis Date  . Allergy    seasonal  . Anticoagulated on warfarin    off since 2015  . Arthritis    ankle- left   . Asthma    no problems in years, rarely uses inhaler  . Clotting disorder ([place])    DVT,PE  . DVT (deep venous thrombosis) ([place])    left leg in 2014;  no DVT now    . Family history of breast cancer   . GERD (gastroesophageal reflux disease)   . Heart murmur    pt denies   . Hyperlipidemia   . PE (pulmonary embolism) 04/2014   gone   . Ruptured or perforated eardrum, bilateral   . Uses hearing aid    bilateral  . Wears partial dentures    upper dentures and lower partial    SURGICAL HISTORY: Past Surgical History:  Procedure Laterality Date  . [place] STUDY N/A 01/02/2018   Procedure: [place] STUDY;  Surgeon: [name], MD;  Location: [facility];  Service: Endoscopy;  Laterality: N/A;  . ABDOMINAL HYSTERECTOMY    . APPENDECTOMY    . COLONOSCOPY    . ESOPHAGEAL MANOMETRY N/A 01/02/2018   Procedure: ESOPHAGEAL MANOMETRY (EM);  Surgeon: [name], MD;  Location: [facility];  Service: Endoscopy;  Laterality: N/A;  . ESOPHAGOGASTRODUODENOSCOPY (EGD) WITH ESOPHAGEAL DILATION     x6  . FOOT SURGERY Bilateral    BONE SPURS  . KNEE SURGERY Right 2011   arthroscopy ; in [place]   . OPEN REDUCTION INTERNAL FIXATION (ORIF) DISTAL RADIAL FRACTURE Right 12/02/2014   Procedure: OPEN REDUCTION INTERNAL FIXATION (ORIF) DISTAL RADIAL FRACTURE;  Surgeon: [name], MD;  Location: [facility];  Service: Orthopedics;  Laterality: Right;  . UPPER GASTROINTESTINAL ENDOSCOPY    . WRIST SURGERY     "broke radius bone and theres plates and screws there now ;  Dr Lorin Mercy did"     SOCIAL HISTORY: Social  History   Socioeconomic History  . Marital status: Married    Spouse name: Not on file  . Number of children: Not on file  . Years of education: Not on file  . Highest education level: Not on file  Occupational History  . Occupation: Ship broker     Comment: [place]  . Financial resource strain: Not on file  . Food insecurity:    Worry: Not on file    Inability: Not on file  . Transportation needs:    Medical: Not on file    Non-medical: Not on file  Tobacco Use  . Smoking status: Former Smoker    Packs/day: 20.00    Types: Cigarettes    Last attempt to quit: 11/07/1991    Years since quitting: 26.4  . Smokeless tobacco:  Never Used  Substance and Sexual Activity  . Alcohol use: No    Alcohol/week: 0.0 oz  . Drug use: No  . Sexual activity: Yes    Birth control/protection: Surgical, Other-see comments    Comment: Hysterectomy  Lifestyle  . Physical activity:    Days per week: Not on file    Minutes per session: Not on file  . Stress: Not on file  Relationships  . Social connections:    Talks on phone: Not on file    Gets together: Not on file    Attends religious service: Not on file    Active member of club or organization: Not on file    Attends meetings of clubs or organizations: Not on file    Relationship status: Not on file  . Intimate partner violence:    Fear of current or ex partner: Not on file    Emotionally abused: Not on file    Physically abused: Not on file    Forced sexual activity: Not on file  Other Topics Concern  . Not on file  Social History Narrative  . Not on file    FAMILY HISTORY: Family History  Problem Relation Age of Onset  . Heart disease Mother   . Dementia Mother        deceased 53  . Heart disease Father        PACEMAKER  . Cancer Father        penile cancer; currently 55  . Breast cancer Sister 62       currently 45  . Breast cancer Maternal Grandmother 66       deceased 16s  . Diabetes Maternal Grandmother   . Esophageal cancer Maternal Uncle   . Colon cancer Neg Hx   . Colon polyps Neg Hx   . Rectal cancer Neg Hx   . Stomach cancer Neg Hx     ALLERGIES:  is allergic to xarelto [rivaroxaban] and pradaxa [dabigatran etexilate mesylate].  MEDICATIONS:  Current Outpatient Medications  Medication Sig Dispense Refill  . apixaban (ELIQUIS) 5 MG TABS tablet Take 1 tablet (5 mg total) by mouth 2 (two) times daily. 60 tablet 2  . traMADol (ULTRAM) 50 MG tablet Take 1 tablet (50 mg total) by mouth every 6 (six) hours as needed. (Patient taking differently: Take 50 mg by mouth every 6 (six) hours as needed for moderate pain or severe pain. ) 20 tablet  0   No current facility-administered medications for this visit.     REVIEW OF SYSTEMS:   Constitutional: Denies fevers, chills or abnormal night sweats Eyes: Denies blurriness of vision, double vision or watery eyes Ears, nose, mouth,  throat, and face: Denies mucositis or sore throat Respiratory: Denies cough, dyspnea or wheezes Cardiovascular: Denies palpitation, chest discomfort or lower extremity swelling Gastrointestinal:  Denies nausea, heartburn or change in bowel habits Skin: Denies abnormal skin rashes Lymphatics: Denies new lymphadenopathy or easy bruising Neurological:Denies numbness, tingling or new weaknesses Behavioral/Psych: Mood is stable, no new changes  All other systems were reviewed with the patient and are negative.  PHYSICAL EXAMINATION: ECOG PERFORMANCE STATUS: 0 - Asymptomatic  There were no vitals filed for this visit. Filed Weights   04/18/18 1430  Weight: 178 lb 8 oz (81 kg)    GENERAL:alert, no distress and comfortable SKIN: skin color, texture, turgor are normal, no rashes or significant lesions EYES: normal, conjunctiva are pink and non-injected, sclera clear OROPHARYNX:no exudate, no erythema and lips, buccal mucosa, and tongue normal  NECK: supple, thyroid normal size, non-tender, without nodularity LYMPH:  no palpable lymphadenopathy in the cervical, axillary or inguinal LUNGS: clear to auscultation and percussion with normal breathing effort HEART: regular rate & rhythm and no murmurs and no lower extremity edema ABDOMEN:abdomen soft, non-tender and normal bowel sounds PSYCH: alert & oriented x 3 with fluent speech   LABORATORY DATA:  I have reviewed the data as listed Recent Results (from the past 2160 hour(s))  Basic metabolic panel     Status: None   Collection Time: 02/28/18  9:34 AM  Result Value Ref Range   Sodium 138 135 - 145 mmol/L   Potassium 4.0 3.5 - 5.1 mmol/L   Chloride 107 101 - 111 mmol/L   CO2 22 22 - 32 mmol/L    Glucose, Bld 90 65 - 99 mg/dL   BUN 7 6 - 20 mg/dL   Creatinine, Ser 0.98 0.44 - 1.00 mg/dL   Calcium 9.1 8.9 - 10.3 mg/dL   GFR calc non Af Amer >60 >60 mL/min   GFR calc Af Amer >60 >60 mL/min    Comment: (NOTE) The eGFR has been calculated using the CKD EPI equation. This calculation has not been validated in all clinical situations. eGFR's persistently <60 mL/min signify possible Chronic Kidney Disease.    Anion gap 9 5 - 15    Comment: Performed at [facility], [place] [address] [place], [place] [address]  CBC     Status: None   Collection Time: 02/28/18  9:34 AM  Result Value Ref Range   WBC 7.6 4.0 - 10.5 K/uL   RBC 4.41 3.87 - 5.11 MIL/uL   Hemoglobin 13.6 12.0 - 15.0 g/dL   HCT 40.8 36.0 - 46.0 %   MCV 92.5 78.0 - 100.0 fL   MCH 30.8 26.0 - 34.0 pg   MCHC 33.3 30.0 - 36.0 g/dL   RDW 13.3 11.5 - 15.5 %   Platelets 323 150 - 400 K/uL    Comment: Performed at [facility], [place] [address] [place], [place] [address]  Type and screen All Cardiac and thoracic surgeries, spinal fusions, myomectomies, craniotomies, colon & liver resections, total joint revisions, same day c-section with placenta previa or accreta.     Status: None   Collection Time: 02/28/18  9:34 AM  Result Value Ref Range   ABO/RH(D) B POS    Antibody Screen NEG    Sample Expiration 03/09/2018    Extend sample reason      NO TRANSFUSIONS OR PREGNANCY IN THE PAST 3 MONTHS Performed at [facility], [place] [address], [place], [place] [address]   ABO/Rh  Status: None   Collection Time: 02/28/18  9:34 AM  Result Value Ref Range   ABO/RH(D)      B POS Performed at [facility], [place] [address], [place], [place] [address]   Basic metabolic panel     Status: Abnormal   Collection Time: 03/07/18  4:40 AM  Result Value Ref Range   Sodium 141 135 - 145 mmol/L   Potassium 4.1 3.5 - 5.1 mmol/L   Chloride 110 101 - 111 mmol/L   CO2 23  22 - 32 mmol/L   Glucose, Bld 133 (H) 65 - 99 mg/dL   BUN 10 6 - 20 mg/dL   Creatinine, Ser 0.79 0.44 - 1.00 mg/dL   Calcium 8.6 (L) 8.9 - 10.3 mg/dL   GFR calc non Af Amer >60 >60 mL/min   GFR calc Af Amer >60 >60 mL/min    Comment: (NOTE) The eGFR has been calculated using the CKD EPI equation. This calculation has not been validated in all clinical situations. eGFR's persistently <60 mL/min signify possible Chronic Kidney Disease.    Anion gap 8 5 - 15    Comment: Performed at [facility], [place] [address], [place], [place] [address]  Urinalysis, Routine w reflex microscopic     Status: Abnormal   Collection Time: 03/25/18  8:11 PM  Result Value Ref Range   Color, Urine AMBER (A) YELLOW    Comment: BIOCHEMICALS MAY BE AFFECTED BY COLOR   APPearance CLEAR CLEAR   Specific Gravity, Urine 1.020 1.005 - 1.030   pH 6.0 5.0 - 8.0   Glucose, UA NEGATIVE NEGATIVE mg/dL   Hgb urine dipstick MODERATE (A) NEGATIVE   Bilirubin Urine NEGATIVE NEGATIVE   Ketones, ur 80 (A) NEGATIVE mg/dL   Protein, ur 30 (A) NEGATIVE mg/dL   Nitrite NEGATIVE NEGATIVE   Leukocytes, UA NEGATIVE NEGATIVE   RBC / HPF 0-5 0 - 5 RBC/hpf   WBC, UA 0-5 0 - 5 WBC/hpf   Bacteria, UA NONE SEEN NONE SEEN   Squamous Epithelial / LPF 0-5 0 - 5   Mucus PRESENT     Comment: Performed at [facility], [place] [address], [place], [place] [address]  Comprehensive metabolic panel     Status: Abnormal   Collection Time: 03/25/18  8:19 PM  Result Value Ref Range   Sodium 136 135 - 145 mmol/L   Potassium 3.7 3.5 - 5.1 mmol/L   Chloride 103 101 - 111 mmol/L   CO2 21 (L) 22 - 32 mmol/L   Glucose, Bld 111 (H) 65 - 99 mg/dL   BUN 7 6 - 20 mg/dL   Creatinine, Ser 0.79 0.44 - 1.00 mg/dL   Calcium 9.0 8.9 - 10.3 mg/dL   Total Protein 7.5 6.5 - 8.1 g/dL   Albumin 3.0 (L) 3.5 - 5.0 g/dL   AST 14 (L) 15 - 41 U/L   ALT 16 14 - 54 U/L   Alkaline Phosphatase 96 38 - 126 U/L   Total Bilirubin  0.9 0.3 - 1.2 mg/dL   GFR calc non Af Amer >60 >60 mL/min   GFR calc Af Amer >60 >60 mL/min    Comment: (NOTE) The eGFR has been calculated using the CKD EPI equation. This calculation has not been validated in all clinical situations. eGFR's persistently <60 mL/min signify possible Chronic Kidney Disease.    Anion gap 12 5 - 15    Comment: Performed at [facility], [place]  [address] [place], [place] [address]  CBC with Differential     Status: Abnormal   Collection Time: 03/25/18  8:19 PM  Result Value Ref Range   WBC 12.4 (H) 4.0 - 10.5 K/uL   RBC 3.98 3.87 - 5.11 MIL/uL   Hemoglobin 12.6 12.0 - 15.0 g/dL   HCT 35.9 (L) 36.0 - 46.0 %   MCV 90.2 78.0 - 100.0 fL   MCH 31.7 26.0 - 34.0 pg   MCHC 35.1 30.0 - 36.0 g/dL   RDW 12.7 11.5 - 15.5 %   Platelets 316 150 - 400 K/uL   Neutrophils Relative % 68 %   Neutro Abs 8.4 (H) 1.7 - 7.7 K/uL   Lymphocytes Relative 23 %   Lymphs Abs 2.9 0.7 - 4.0 K/uL   Monocytes Relative 9 %   Monocytes Absolute 1.1 (H) 0.1 - 1.0 K/uL   Eosinophils Relative 0 %   Eosinophils Absolute 0.0 0.0 - 0.7 K/uL   Basophils Relative 0 %   Basophils Absolute 0.0 0.0 - 0.1 K/uL    Comment: Performed at [facility], [place] [address] [place], [place] [address]  I-Stat CG4 Lactic Acid, ED     Status: None   Collection Time: 03/25/18  8:29 PM  Result Value Ref Range   Lactic Acid, Venous 1.17 0.5 - 1.9 mmol/L  Protime-INR     Status: Abnormal   Collection Time: 03/26/18  3:37 AM  Result Value Ref Range   Prothrombin Time 17.1 (H) 11.4 - 15.2 seconds   INR 1.40     Comment: Performed at [facility], [place] [address] [place], [place] [address]  APTT     Status: None   Collection Time: 03/26/18  3:37 AM  Result Value Ref Range   aPTT 34 24 - 36 seconds    Comment: Performed at [facility], [place] [address], [place],  [address]    RADIOGRAPHIC STUDIES: I have personally  reviewed the radiological images as listed and agreed with the findings in the report. Ct Abdomen Pelvis W Contrast  Result Date: 03/26/2018 CLINICAL DATA:  Increasing upper abdominal pain for 2 days. Status post Nissen fundoplication 3 weeks ago. EXAM: CT ABDOMEN AND PELVIS WITH CONTRAST TECHNIQUE: Multidetector CT imaging of the abdomen and pelvis was performed using the standard protocol following bolus administration of intravenous contrast. CONTRAST:  138m ISOVUE-300 IOPAMIDOL (ISOVUE-300) INJECTION 61% COMPARISON:  None. FINDINGS: Lower chest: Basilar atelectasis, left greater than right. Hepatobiliary: Findings suspicious for partial portal vein thrombosis with low density within the intrahepatic right portal vein, extending to the extrahepatic main portal vein and possibly superior mesenteric veins. Geographic low-density involving the left lobe of the liver. No discrete focal hepatic lesion. Calcified gallstone within the gallbladder without abnormal gallbladder distention. Pancreas: Fat stranding about the body and neck of the pancreas is likely centered on the mesenteric and portal vein rather than primary pancreatic inflammation. No pancreatic ductal dilatation. Spleen: Normal in size without focal abnormality. Adrenals/Urinary Tract: Normal adrenal glands. No hydronephrosis or perinephric edema. Homogeneous renal enhancement with symmetric excretion on delayed phase imaging. Urinary bladder is physiologically distended without wall thickening. Stomach/Bowel: Post Nissen fundoplication without evidence of postoperative complication. Stomach physiologically distended. Mild stranding about the third portion of the duodenum appears centered more on the mesenteric veins rather than duodenal inflammation. No small bowel wall thickening, inflammatory change or obstruction. Multifocal colonic diverticulosis, prominent in the distal descending and sigmoid colon without diverticulitis. Appendix not  visualized, history of appendectomy. No colonic inflammation or wall thickening. Vascular/Lymphatic: Low-density filling defect in the right portal vein and extrahepatic main portal vein. Adjacent soft tissue stranding tracks along the extrahepatic portal vein and superior mesenteric vein which appears expanded. Jejunal branches of the superior mesenteric vein also are prominent size without definite intraluminal filling defect, there is mesenteric edema tracking along the central mesentery. Decreased attenuation of the splenic vein at the porta splenic confluence. Aortic atherosclerosis without aneurysm. Small central mesenteric nodes are likely reactive. Reproductive: Status post hysterectomy. No adnexal masses. Other: Small to moderate pelvic ascites. No free air. No intra-abdominal abscess. Small fat containing umbilical hernia. Laparoscopic port sites in the anterior abdominal wall. Musculoskeletal: There are no acute or suspicious osseous abnormalities. Degenerative disc disease at L5-S1. IMPRESSION: 1. 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 Superior mesenteric vein also appears expanded with adjacent stranding and may be occluded versus congested. No bowel inflammation or evidence of bowel ischemia. 2. Geographic decreased density of the left hepatic lobe compared to right may be vascular or steatosis. 3. Diffuse colonic diverticulosis without diverticulitis. 4. Post Nissen fundoplication without postoperative complication. 5. Gallstone without gallbladder inflammation. These results were called by telephone at the time of interpretation on 03/26/2018 at 3:25 am to Dr. Rolland Porter , who verbally acknowledged these results. Electronically Signed   By: Jeb Levering M.D.   On: 03/26/2018 03:26    ASSESSMENT & PLAN:  Portal vein thrombosis 1.  Hypercoagulable state: - Unprovoked pulmonary  embolism in August 2014, diagnosed in [facility], started on Coumadin (could not tolerate Xarelto and Pradaxa due to nausea) - Developed femoral vein DVT on 04/10/2014 while patient on Coumadin, subsequently changed to Eliquis, discontinued in October 2015. -Hypercoagulable work-up on 09/15/2014 while patient off anticoagulation shows normal protein C, protein S, negative lupus anticoagulant, negative beta-2 glycoprotein 1 antibody, negative anticardiolipin 1 antibody, negative factor V Leiden and PT 20210A mutation - She underwent Nissen fundoplication for acid reflux on 03/06/2018, stayed in the hospital for 2 to 3 days, received Lovenox prophylaxis doses. - Developed severe abdominal pain, admitted to hospital on 03/25/2018, CT scan of the abdomen on 03/26/2018 shows partial portal vein thrombus within the intrahepatic right portal vein, extends to extrahepatic main portal vein and possibly SMV.  Patient was given Lovenox and transitioned to Eliquis which she is taking now. - As she had at least 2 unprovoked DVTs, I have recommended indefinite anticoagulation.  She is agreeable to this. - I will do one last test for hypercoagulable state, which is Jak 2 V617F mutation to rule out myeloproliferative neoplasms which can cause splanchnic vein thrombosis.  She will be seen back in 2 to 3 weeks to discuss the results.     All questions were answered. The patient knows to call the clinic with any problems, questions or concerns. Total time spent is 45 minutes with more than 50% of the time spent face-to-face discussing her diagnosis, therapeutic recommendations and further testing and coordination of care.     Derek Jack, MD 04/18/18 5:30 PM

## 2018-04-18 NOTE — Assessment & Plan Note (Signed)
1.  Hypercoagulable state: - Unprovoked pulmonary embolism in August 2014, diagnosed in [place] , started on Coumadin (could not tolerate Xarelto and Pradaxa due to nausea) - Developed femoral vein DVT on 04/10/2014 while patient on Coumadin, subsequently changed to Eliquis, discontinued in October 2015. -Hypercoagulable work-up on 09/15/2014 while patient off anticoagulation shows normal protein C, protein S, negative lupus anticoagulant, negative beta-2 glycoprotein 1 antibody, negative anticardiolipin 1 antibody, negative factor V Leiden and PT 20210A mutation - She underwent Nissen fundoplication for acid reflux on 03/06/2018, stayed in the hospital for 2 to 3 days, received Lovenox prophylaxis doses. - Developed severe abdominal pain, admitted to hospital on 03/25/2018, CT scan of the abdomen on 03/26/2018 shows partial portal vein thrombus within the intrahepatic right portal vein, extends to extrahepatic main portal vein and possibly SMV.  Patient was given Lovenox and transitioned to Eliquis which she is taking now. - As she had at least 2 unprovoked DVTs, I have recommended indefinite anticoagulation.  She is agreeable to this. - I will do one last test for hypercoagulable state, which is Jak 2 V617F mutation to rule out myeloproliferative neoplasms which can cause splanchnic vein thrombosis.  She will be seen back in 2 to 3 weeks to discuss the results.

## 2018-05-01 LAB — JAK2 V617F, W REFLEX TO CALR/E12/MPL

## 2018-05-01 LAB — CALR + JAK2 E12-15 + MPL (REFLEXED)

## 2018-05-22 ENCOUNTER — Encounter (HOSPITAL_COMMUNITY): Payer: Self-pay | Admitting: Hematology

## 2018-05-22 ENCOUNTER — Ambulatory Visit (HOSPITAL_COMMUNITY)
Admission: RE | Admit: 2018-05-22 | Discharge: 2018-05-22 | Disposition: A | Discharge status: Home / Self Care | Payer: Federal, State, Local not specified - PPO | Source: Ambulatory Visit | Attending: Nurse Practitioner | Admitting: Nurse Practitioner

## 2018-05-22 ENCOUNTER — Inpatient Hospital Stay (HOSPITAL_COMMUNITY): Payer: Federal, State, Local not specified - PPO | Attending: Hematology | Admitting: Hematology

## 2018-05-22 ENCOUNTER — Other Ambulatory Visit: Payer: Self-pay

## 2018-05-22 VITALS — BP 130/69 | HR 58 | Temp 97.8°F | Resp 16 | Wt 173.0 lb

## 2018-05-22 DIAGNOSIS — I81 Portal vein thrombosis: Secondary | ICD-10-CM | POA: Diagnosis not present

## 2018-05-22 DIAGNOSIS — M79662 Pain in left lower leg: Secondary | ICD-10-CM | POA: Diagnosis not present

## 2018-05-22 DIAGNOSIS — Z87891 Personal history of nicotine dependence: Secondary | ICD-10-CM | POA: Diagnosis not present

## 2018-05-22 DIAGNOSIS — Z803 Family history of malignant neoplasm of breast: Secondary | ICD-10-CM

## 2018-05-22 DIAGNOSIS — Z7901 Long term (current) use of anticoagulants: Secondary | ICD-10-CM | POA: Diagnosis not present

## 2018-05-22 DIAGNOSIS — Z800 Family history of malignant neoplasm of digestive organs: Secondary | ICD-10-CM | POA: Insufficient documentation

## 2018-05-22 NOTE — Progress Notes (Signed)
[facility] [address], [place] [address]   CLINIC:  Medical Oncology/Hematology  PCP:  Lenell Antu, DO [address] [place] [address] [phone]   REASON FOR VISIT:  Follow-up for portal vein thrombosis  CURRENT THERAPY: Eliquis   INTERVAL HISTORY:  Whitney Peters 57 y.o. female returns for routine follow-up for portal vein thrombosis. Patient is here today and has a severe pain in her left groin. She describes it as a stabbing pain. The pain comes and goes isn't constant. She describes it as the same pain as the other blood clots she has had. She states the abdominal pain from the last clot is gone. Denies any other pains. She has been taking her Eliquis daily as prescribed. Denies any issues taking the medication no nausea or vomiting.    REVIEW OF SYSTEMS:  Review of Systems  Hematological:       Left groin pain   All other systems reviewed and are negative.    PAST MEDICAL/SURGICAL HISTORY:  Past Medical History:  Diagnosis Date  . Allergy    seasonal  . Anticoagulated on warfarin    off since 2015  . Arthritis    ankle- left   . Asthma    no problems in years, rarely uses inhaler  . Clotting disorder (HCC)    DVT,PE  . DVT (deep venous thrombosis) (HCC)    left leg in 2014;  no DVT now    . Family history of breast cancer   . GERD (gastroesophageal reflux disease)   . Heart murmur    pt denies  . Hyperlipidemia   . PE (pulmonary embolism) 04/2014   gone   . Ruptured or perforated eardrum, bilateral   . Uses hearing aid    bilateral  . Wears partial dentures    upper dentures and lower partial   Past Surgical History:  Procedure Laterality Date  . 24 HOUR PH STUDY N/A 01/02/2018   Procedure: 24 HOUR PH STUDY;  Surgeon: Napoleon Form, MD;  Location: [facility];  Service: Endoscopy;  Laterality: N/A;  . ABDOMINAL HYSTERECTOMY    . APPENDECTOMY    . COLONOSCOPY    . ESOPHAGEAL MANOMETRY N/A 01/02/2018   Procedure: ESOPHAGEAL MANOMETRY (EM);  Surgeon: Napoleon Form, MD;  Location: [facility];  Service: Endoscopy;  Laterality: N/A;  . ESOPHAGOGASTRODUODENOSCOPY (EGD) WITH ESOPHAGEAL DILATION     x6  . FOOT SURGERY Bilateral    BONE SPURS  . KNEE SURGERY Right 2011   arthroscopy ; in [place]   . OPEN REDUCTION INTERNAL FIXATION (ORIF) DISTAL RADIAL FRACTURE Right 12/02/2014   Procedure: OPEN REDUCTION INTERNAL FIXATION (ORIF) DISTAL RADIAL FRACTURE;  Surgeon: Eldred Manges, MD;  Location: [facility] OR;  Service: Orthopedics;  Laterality: Right;  . UPPER GASTROINTESTINAL ENDOSCOPY    . WRIST SURGERY     "broke radius bone and theres plates and screws there now ;  Dr Ophelia Charter did"      SOCIAL HISTORY:  Social History   Socioeconomic History  . Marital status: Married    Spouse name: Not on file  . Number of children: Not on file  . Years of education: Not on file  . Highest education level: Not on file  Occupational History  . Occupation: Consulting civil engineer     Comment: CMA  Social Needs  . Financial resource strain: Not on file  . Food insecurity:    Worry: Not on file  Inability: Not on file  . Transportation needs:    Medical: Not on file    Non-medical: Not on file  Tobacco Use  . Smoking status: Former Smoker    Packs/day: 20.00    Types: Cigarettes    Last attempt to quit: 11/07/1991    Years since quitting: 26.5  . Smokeless tobacco: Never Used  Substance and Sexual Activity  . Alcohol use: No    Alcohol/week: 0.0 oz  . Drug use: No  . Sexual activity: Yes    Birth control/protection: Surgical, Other-see comments    Comment: Hysterectomy  Lifestyle  . Physical activity:    Days per week: Not on file    Minutes per session: Not on file  . Stress: Not on file  Relationships  . Social connections:    Talks on phone: Not on file    Gets together: Not on file    Attends religious service: Not on file    Active member of club or organization: Not on file    Attends  meetings of clubs or organizations: Not on file    Relationship status: Not on file  . Intimate partner violence:    Fear of current or ex partner: Not on file    Emotionally abused: Not on file    Physically abused: Not on file    Forced sexual activity: Not on file  Other Topics Concern  . Not on file  Social History Narrative  . Not on file    FAMILY HISTORY:  Family History  Problem Relation Age of Onset  . Heart disease Mother   . Dementia Mother        deceased 2677  . Heart disease Father        PACEMAKER  . Cancer Father        penile cancer; currently 6583  . Breast cancer Sister 2238       currently 8356  . Breast cancer Maternal Grandmother 3465       deceased 3770s  . Diabetes Maternal Grandmother   . Esophageal cancer Maternal Uncle   . Colon cancer Neg Hx   . Colon polyps Neg Hx   . Rectal cancer Neg Hx   . Stomach cancer Neg Hx     CURRENT MEDICATIONS:  Outpatient Encounter Medications as of 05/22/2018  Medication Sig  . apixaban (ELIQUIS) 5 MG TABS tablet Take 1 tablet (5 mg total) by mouth 2 (two) times daily.  . traMADol (ULTRAM) 50 MG tablet Take 1 tablet (50 mg total) by mouth every 6 (six) hours as needed. (Patient not taking: Reported on 05/22/2018)   No facility-administered encounter medications on file as of 05/22/2018.     ALLERGIES:  Allergies  Allergen Reactions  . Xarelto [Rivaroxaban] Other (See Comments)    nausea  . Pradaxa [Dabigatran Etexilate Mesylate] Nausea Only     PHYSICAL EXAM:  ECOG Performance status: 0  Vitals:   05/22/18 0908  BP: 130/69  Pulse: (!) 58  Resp: 16  Temp: 97.8 F (36.6 C)  SpO2: 98%   Filed Weights   05/22/18 0908  Weight: 173 lb (78.5 kg)    Physical Exam   LABORATORY DATA:  I have reviewed the labs as listed.  CBC    Component Value Date/Time   WBC 12.4 (H) 03/25/2018 2019   RBC 3.98 03/25/2018 2019   HGB 12.6 03/25/2018 2019   HCT 35.9 (L) 03/25/2018 2019   PLT 316 03/25/2018 2019   MCV  90.2 03/25/2018 2019   MCH 31.7 03/25/2018 2019   MCHC 35.1 03/25/2018 2019   RDW 12.7 03/25/2018 2019   LYMPHSABS 2.9 03/25/2018 2019   MONOABS 1.1 (H) 03/25/2018 2019   EOSABS 0.0 03/25/2018 2019   BASOSABS 0.0 03/25/2018 2019   CMP Latest Ref Rng & Units 03/25/2018 03/07/2018 02/28/2018  Glucose 65 - 99 mg/dL 295(A) 213(Y) 90  BUN 6 - 20 mg/dL 7 10 7   Creatinine 0.44 - 1.00 mg/dL 8.65 7.84 6.96  Sodium 135 - 145 mmol/L 136 141 138  Potassium 3.5 - 5.1 mmol/L 3.7 4.1 4.0  Chloride 101 - 111 mmol/L 103 110 107  CO2 22 - 32 mmol/L 21(L) 23 22  Calcium 8.9 - 10.3 mg/dL 9.0 2.9(B) 9.1  Total Protein 6.5 - 8.1 g/dL 7.5 - -  Total Bilirubin 0.3 - 1.2 mg/dL 0.9 - -  Alkaline Phos 38 - 126 U/L 96 - -  AST 15 - 41 U/L 14(L) - -  ALT 14 - 54 U/L 16 - -       DIAGNOSTIC IMAGING:  I have independently reviewed the left lower extremity Doppler report.  I discussed it with the patient.     ASSESSMENT & PLAN:   Portal vein thrombosis 1.  Hypercoagulable state: - Unprovoked pulmonary embolism in August 2014, diagnosed in [facility], started on Coumadin (could not tolerate Xarelto and Pradaxa due to nausea) - Developed femoral vein DVT on 04/10/2014 while patient on Coumadin, subsequently changed to Eliquis, discontinued in October 2015. -Hypercoagulable work-up on 09/15/2014 while patient off anticoagulation shows normal protein C, protein S, negative lupus anticoagulant, negative beta-2 glycoprotein 1 antibody, negative anticardiolipin 1 antibody, negative factor V Leiden and PT 20210A mutation - She underwent Nissen fundoplication for acid reflux on 03/06/2018, stayed in the hospital for 2 to 3 days, received Lovenox prophylaxis doses. - Developed severe abdominal pain, admitted to hospital on 03/25/2018, CT scan of the abdomen on 03/26/2018 shows partial portal vein thrombus within the intrahepatic right portal vein, extends to extrahepatic main portal vein and possibly SMV.   Patient was given Lovenox and transitioned to Eliquis which she is taking now. - As she had at least 2 unprovoked DVTs, I have recommended indefinite anticoagulation.  She is agreeable to this. - We talked about the results of the Jak 2 reflex testing which were all negative for myeloproliferative neoplasms. -She complained of pain in her left thigh region, sharp in nature, 4 out of 10 in intensity started 2 weeks ago.  The pain is very similar to the pain she perceived when she had right femoral DVT.  We will order a Doppler of her left lower extremity stat.  Addendum: -I have reviewed the results of the left lower extremity Doppler which was done today, negative for deep vein thrombosis.  Her left groin pain is most likely musculoskeletal.  She will continue Eliquis at this time.  We will see her back in 6 months for follow-up.      Orders placed this encounter:  Orders Placed This Encounter  Procedures  . US Venous Img Lower Unilateral Left  . D-dimer, quantitative      Doreatha Massed, MD [facility] [phone]

## 2018-05-22 NOTE — Assessment & Plan Note (Addendum)
1.  Hypercoagulable state: - [place] started on [place] (could not tolerate Xarelto and Pradaxa due to nausea) - Developed femoral vein DVT on 04/10/2014 while patient on Coumadin, subsequently changed to Eliquis, discontinued in October 2015. -Hypercoagulable work-up on 09/15/2014 while patient off anticoagulation shows normal protein C, protein S, negative lupus anticoagulant, negative beta-2 glycoprotein 1 antibody, negative anticardiolipin 1 antibody, negative factor V Leiden and PT 20210A mutation - She underwent Nissen fundoplication for acid reflux on 03/06/2018, stayed in the hospital for 2 to 3 days, received Lovenox prophylaxis doses. - Developed severe abdominal pain, admitted to hospital on 03/25/2018, CT scan of the abdomen on 03/26/2018 shows partial portal vein thrombus within the intrahepatic right portal vein, extends to extrahepatic main portal vein and possibly SMV.  Patient was given Lovenox and transitioned to Eliquis which she is taking now. - As she had at least 2 unprovoked DVTs, I have recommended indefinite anticoagulation.  She is agreeable to this. - We talked about the results of the Jak 2 reflex testing which were all negative for myeloproliferative neoplasms. -She complained of pain in her left thigh region, sharp in nature, 4 out of 10 in intensity started 2 weeks ago.  The pain is very similar to the pain she perceived when she had right femoral DVT.  We will order a Doppler of her left lower extremity stat.  Addendum: -I have reviewed the results of the left lower extremity Doppler which was done today, negative for deep vein thrombosis.  Her left groin pain is most likely musculoskeletal.  She will continue Eliquis at this time.  We will see her back in 6 months for follow-up.

## 2018-05-24 ENCOUNTER — Ambulatory Visit (HOSPITAL_COMMUNITY): Payer: Federal, State, Local not specified - PPO | Admitting: Oncology

## 2018-05-28 DIAGNOSIS — E782 Mixed hyperlipidemia: Secondary | ICD-10-CM | POA: Diagnosis not present

## 2018-05-28 DIAGNOSIS — I81 Portal vein thrombosis: Secondary | ICD-10-CM | POA: Diagnosis not present

## 2018-05-28 DIAGNOSIS — Z86718 Personal history of other venous thrombosis and embolism: Secondary | ICD-10-CM | POA: Diagnosis not present

## 2018-05-28 DIAGNOSIS — R1032 Left lower quadrant pain: Secondary | ICD-10-CM | POA: Diagnosis not present

## 2018-05-30 ENCOUNTER — Other Ambulatory Visit: Payer: Self-pay | Admitting: Family Medicine

## 2018-05-30 DIAGNOSIS — E782 Mixed hyperlipidemia: Secondary | ICD-10-CM | POA: Diagnosis not present

## 2018-05-30 DIAGNOSIS — L659 Nonscarring hair loss, unspecified: Secondary | ICD-10-CM | POA: Diagnosis not present

## 2018-05-30 DIAGNOSIS — M25552 Pain in left hip: Secondary | ICD-10-CM

## 2018-05-30 DIAGNOSIS — R1032 Left lower quadrant pain: Secondary | ICD-10-CM | POA: Diagnosis not present

## 2018-05-30 DIAGNOSIS — Z86718 Personal history of other venous thrombosis and embolism: Secondary | ICD-10-CM | POA: Diagnosis not present

## 2018-06-01 ENCOUNTER — Ambulatory Visit (HOSPITAL_BASED_OUTPATIENT_CLINIC_OR_DEPARTMENT_OTHER): Payer: Federal, State, Local not specified - PPO

## 2018-06-15 ENCOUNTER — Ambulatory Visit (HOSPITAL_BASED_OUTPATIENT_CLINIC_OR_DEPARTMENT_OTHER)
Admission: RE | Admit: 2018-06-15 | Discharge: 2018-06-15 | Disposition: A | Discharge status: Home / Self Care | Payer: Federal, State, Local not specified - PPO | Source: Ambulatory Visit | Attending: Family Medicine | Admitting: Family Medicine

## 2018-06-15 DIAGNOSIS — M1612 Unilateral primary osteoarthritis, left hip: Secondary | ICD-10-CM | POA: Diagnosis not present

## 2018-06-15 DIAGNOSIS — R1032 Left lower quadrant pain: Secondary | ICD-10-CM | POA: Diagnosis not present

## 2018-06-15 DIAGNOSIS — M25552 Pain in left hip: Secondary | ICD-10-CM | POA: Diagnosis not present

## 2018-06-24 DIAGNOSIS — Z86718 Personal history of other venous thrombosis and embolism: Secondary | ICD-10-CM | POA: Diagnosis not present

## 2018-06-24 DIAGNOSIS — Z86711 Personal history of pulmonary embolism: Secondary | ICD-10-CM | POA: Diagnosis not present

## 2018-06-24 DIAGNOSIS — K219 Gastro-esophageal reflux disease without esophagitis: Secondary | ICD-10-CM | POA: Diagnosis not present

## 2018-06-24 DIAGNOSIS — M1612 Unilateral primary osteoarthritis, left hip: Secondary | ICD-10-CM | POA: Diagnosis not present

## 2018-06-24 DIAGNOSIS — Z0000 Encounter for general adult medical examination without abnormal findings: Secondary | ICD-10-CM | POA: Diagnosis not present

## 2018-08-06 ENCOUNTER — Other Ambulatory Visit: Payer: Self-pay

## 2018-08-06 ENCOUNTER — Emergency Department (HOSPITAL_COMMUNITY): Payer: Federal, State, Local not specified - PPO

## 2018-08-06 ENCOUNTER — Encounter (HOSPITAL_COMMUNITY): Payer: Self-pay

## 2018-08-06 ENCOUNTER — Emergency Department (HOSPITAL_COMMUNITY)
Admission: EM | Admit: 2018-08-06 | Discharge: 2018-08-06 | Disposition: A | Discharge status: Home / Self Care | Payer: Federal, State, Local not specified - PPO | Attending: Emergency Medicine | Admitting: Emergency Medicine

## 2018-08-06 DIAGNOSIS — J45909 Unspecified asthma, uncomplicated: Secondary | ICD-10-CM | POA: Insufficient documentation

## 2018-08-06 DIAGNOSIS — R1084 Generalized abdominal pain: Secondary | ICD-10-CM

## 2018-08-06 DIAGNOSIS — Z86711 Personal history of pulmonary embolism: Secondary | ICD-10-CM | POA: Insufficient documentation

## 2018-08-06 DIAGNOSIS — Z79899 Other long term (current) drug therapy: Secondary | ICD-10-CM | POA: Diagnosis not present

## 2018-08-06 DIAGNOSIS — K8020 Calculus of gallbladder without cholecystitis without obstruction: Secondary | ICD-10-CM | POA: Insufficient documentation

## 2018-08-06 DIAGNOSIS — Z87891 Personal history of nicotine dependence: Secondary | ICD-10-CM | POA: Insufficient documentation

## 2018-08-06 DIAGNOSIS — Z7901 Long term (current) use of anticoagulants: Secondary | ICD-10-CM | POA: Diagnosis not present

## 2018-08-06 DIAGNOSIS — R1011 Right upper quadrant pain: Secondary | ICD-10-CM | POA: Diagnosis not present

## 2018-08-06 LAB — COMPREHENSIVE METABOLIC PANEL
ALT: 20 U/L (ref 0–44)
ANION GAP: 7 (ref 5–15)
AST: 23 U/L (ref 15–41)
Albumin: 3.6 g/dL (ref 3.5–5.0)
Alkaline Phosphatase: 124 U/L (ref 38–126)
BUN: <5 mg/dL — ABNORMAL LOW (ref 6–20)
CHLORIDE: 107 mmol/L (ref 98–111)
CO2: 25 mmol/L (ref 22–32)
CREATININE: 0.75 mg/dL (ref 0.44–1.00)
Calcium: 9.2 mg/dL (ref 8.9–10.3)
GFR calc Af Amer: >60 mL/min (ref >60)
Glucose, Bld: 92 mg/dL (ref 70–99)
POTASSIUM: 3.3 mmol/L — AB (ref 3.5–5.1)
SODIUM: 139 mmol/L (ref 135–145)
Total Bilirubin: 0.9 mg/dL (ref 0.3–1.2)
Total Protein: 6.7 g/dL (ref 6.5–8.1)

## 2018-08-06 LAB — URINALYSIS, ROUTINE W REFLEX MICROSCOPIC
Bacteria, UA: NONE SEEN
Bilirubin Urine: NEGATIVE
Glucose, UA: NEGATIVE mg/dL
Hgb urine dipstick: NEGATIVE
Ketones, ur: 5 mg/dL — AB
LEUKOCYTES UA: NEGATIVE
Nitrite: NEGATIVE
PH: 7 (ref 5.0–8.0)
Protein, ur: NEGATIVE mg/dL

## 2018-08-06 LAB — CBC
HCT: 41.6 % (ref 36.0–46.0)
HEMOGLOBIN: 13.6 g/dL (ref 12.0–15.0)
MCH: 30.1 pg (ref 26.0–34.0)
MCHC: 32.7 g/dL (ref 30.0–36.0)
MCV: 92 fL (ref 78.0–100.0)
Platelets: 180 10*3/uL (ref 150–400)
RBC: 4.52 MIL/uL (ref 3.87–5.11)
RDW: 13.8 % (ref 11.5–15.5)
WBC: 8.2 10*3/uL (ref 4.0–10.5)

## 2018-08-06 LAB — I-STAT BETA HCG BLOOD, ED (MC, WL, AP ONLY): I-stat hCG, quantitative: 8.1 m[IU]/mL — ABNORMAL HIGH (ref <5)

## 2018-08-06 LAB — LIPASE, BLOOD: Lipase: 24 U/L (ref 11–51)

## 2018-08-06 MED ORDER — IOHEXOL 300 MG/ML  SOLN
100.0000 mL | Freq: Once | INTRAMUSCULAR | Status: AC | PRN
  Administered 2018-08-06: 100 mL via INTRAVENOUS

## 2018-08-06 NOTE — ED Provider Notes (Signed)
[facility] EMERGENCY DEPARTMENT Provider Note   CSN: [id number] Arrival date & time: 08/06/18  1534     History   Chief Complaint Chief Complaint  Patient presents with  . Abdominal Pain    HPI Whitney Peters is a 57 y.o. female.  The history is provided by the patient. No language interpreter was used.  Abdominal Pain   This is a new problem. The current episode started 1 to 2 hours ago. The problem has not changed since onset.The pain is located in the RUQ. The pain is at a severity of 8/10. The pain is severe. Pertinent negatives include anorexia, vomiting, dysuria and frequency. Nothing aggravates the symptoms. Nothing relieves the symptoms. Her past medical history is significant for gallstones.  Pt reports she had sudden onset of pain right upper abdominal pain.  Pt reports she was shopping and pain came on suddenly.  Pt reports she had similar pain in the past when she was diagnosed with portal vein thrombosis.  Pt is currently on eliquis   Past Medical History:  Diagnosis Date  . Allergy    seasonal  . Anticoagulated on warfarin    off since 2015  . Arthritis    ankle- left   . Asthma    no problems in years, rarely uses inhaler  . Clotting disorder (HCC)    DVT,PE  . DVT (deep venous thrombosis) (HCC)    left leg in 2014;  no DVT now    . Family history of breast cancer   . GERD (gastroesophageal reflux disease)   . Heart murmur    pt denies  . Hyperlipidemia   . PE (pulmonary embolism) 04/2014   gone   . Ruptured or perforated eardrum, bilateral   . Uses hearing aid    bilateral  . Wears partial dentures    upper dentures and lower partial    Patient Active Problem List   Diagnosis Date Noted  . Portal vein thrombosis 03/26/2018  . S/P Nissen fundoplication (without gastrostomy tube) procedure 03/06/2018  . Dysphagia   . Family history of breast cancer   . Esophageal stricture 08/28/2016  . Wrist fracture 12/02/2014  . Deep  venous embolism and thrombosis of left lower extremity (HCC) 04/29/2014  . GERD (gastroesophageal reflux disease) 01/22/2014  . Hyperlipidemia LDL goal < 100 01/22/2014  . Hx pulmonary embolism 01/22/2014  . Vitamin D deficiency 01/22/2014    Past Surgical History:  Procedure Laterality Date  . 24 HOUR PH STUDY N/A 01/02/2018   Procedure: 24 HOUR PH STUDY;  Surgeon: Napoleon Form, MD;  Location: [facility];  Service: Endoscopy;  Laterality: N/A;  . ABDOMINAL HYSTERECTOMY    . APPENDECTOMY    . COLONOSCOPY    . ESOPHAGEAL MANOMETRY N/A 01/02/2018   Procedure: ESOPHAGEAL MANOMETRY (EM);  Surgeon: Napoleon Form, MD;  Location: [facility];  Service: Endoscopy;  Laterality: N/A;  . ESOPHAGOGASTRODUODENOSCOPY (EGD) WITH ESOPHAGEAL DILATION     x6  . FOOT SURGERY Bilateral    BONE SPURS  . KNEE SURGERY Right 2011   arthroscopy ; in [place]   . OPEN REDUCTION INTERNAL FIXATION (ORIF) DISTAL RADIAL FRACTURE Right 12/02/2014   Procedure: OPEN REDUCTION INTERNAL FIXATION (ORIF) DISTAL RADIAL FRACTURE;  Surgeon: Eldred Manges, MD;  Location: [facility] OR;  Service: Orthopedics;  Laterality: Right;  . UPPER GASTROINTESTINAL ENDOSCOPY    . WRIST SURGERY     "broke radius bone and theres plates and screws there now ;  Dr Ophelia Charter did"      OB History   None      Home Medications    Prior to Admission medications   Medication Sig Start Date End Date Taking? Authorizing Provider  acetaminophen (TYLENOL) 325 MG tablet Take 650 mg by mouth every 6 (six) hours as needed (for pain or headaches).   Yes [provider]  apixaban (ELIQUIS) 5 MG TABS tablet Take 1 tablet (5 mg total) by mouth 2 (two) times daily. 04/02/18  Yes Marinda Elk, MD  BIOTIN PO Take 1 tablet by mouth daily.   Yes [provider]  traMADol (ULTRAM) 50 MG tablet Take 1 tablet (50 mg total) by mouth every 6 (six) hours as needed. Patient not taking: Reported on 08/06/2018 03/08/18 03/08/19   Axel Filler, MD    Family History Family History  Problem Relation Age of Onset  . Heart disease Mother   . Dementia Mother        deceased 76  . Heart disease Father        PACEMAKER  . Cancer Father        penile cancer; currently 25  . Breast cancer Sister 3       currently 63  . Breast cancer Maternal Grandmother 24       deceased 33s  . Diabetes Maternal Grandmother   . Esophageal cancer Maternal Uncle   . Colon cancer Neg Hx   . Colon polyps Neg Hx   . Rectal cancer Neg Hx   . Stomach cancer Neg Hx     Social History Social History   Tobacco Use  . Smoking status: Former Smoker    Packs/day: 20.00    Types: Cigarettes    Last attempt to quit: 11/07/1991    Years since quitting: 26.7  . Smokeless tobacco: Never Used  Substance Use Topics  . Alcohol use: No    Alcohol/week: 0.0 standard drinks  . Drug use: No     Allergies   Xarelto [rivaroxaban]; Chocolate; Lactose intolerance (gi); Meat [alpha-gal]; and Pradaxa [dabigatran etexilate mesylate]   Review of Systems Review of Systems  Gastrointestinal: Positive for abdominal pain. Negative for anorexia and vomiting.  Genitourinary: Negative for dysuria and frequency.  All other systems reviewed and are negative.    Physical Exam Updated Vital Signs BP 125/72   Pulse (!) 59   Temp 98.1 F (36.7 C) (Oral)   Resp 19   Ht 5\' 9"  (1.753 m)   Wt 73 kg   SpO2 97%   BMI 23.78 kg/m   Physical Exam  Constitutional: She appears well-developed and well-nourished.  HENT:  Head: Normocephalic.  Mouth/Throat: Oropharynx is clear and moist.  Eyes: Pupils are equal, round, and reactive to light.  Cardiovascular: Normal rate.  Pulmonary/Chest: Effort normal.  Abdominal: Normal appearance and bowel sounds are normal. There is tenderness in the right upper quadrant.  Soft   Neurological: She is alert.  Skin: Skin is warm.  Psychiatric: She has a normal mood and affect.  Nursing note and vitals  reviewed.    ED Treatments / Results  Labs (all labs ordered are listed, but only abnormal results are displayed) Labs Reviewed  COMPREHENSIVE METABOLIC PANEL - Abnormal; Notable for the following components:      Result Value   Potassium 3.3 (*)    BUN <5 (*)    All other components within normal limits  I-STAT BETA HCG BLOOD, ED ([facility], [facility], [facility] ONLY) - Abnormal; Notable  for the following components:   I-stat hCG, quantitative 8.1 (*)    All other components within normal limits  LIPASE, BLOOD  CBC  URINALYSIS, ROUTINE W REFLEX MICROSCOPIC    EKG None  Radiology Ct Abdomen Pelvis W Contrast  Result Date: 08/06/2018 CLINICAL DATA:  Abdominal pain, status post Nissen fundoplication in May, complicated by portal vein thrombosis, on Eliquis EXAM: CT ABDOMEN AND PELVIS WITH CONTRAST TECHNIQUE: Multidetector CT imaging of the abdomen and pelvis was performed using the standard protocol following bolus administration of intravenous contrast. CONTRAST:  OMNIPAQUE IOHEXOL 300 MG/ML  SOLN COMPARISON:  03/26/2018 FINDINGS: Lower chest: Lung bases are clear. Hepatobiliary: Liver is within normal limits. Layering small gallstone (series 3/image 25), without associated inflammatory changes. No intrahepatic or extrahepatic ductal dilatation. Pancreas: Within normal limits. Spleen: Within normal limits. Adrenals/Urinary Tract: Adrenal glands are within normal limits. Kidneys are within normal limits.  No hydronephrosis. Bladder is within normal limits. Stomach/Bowel: Stomach is notable for postsurgical changes related to Nissen fundoplication. No evidence of bowel obstruction. Appendix is not discretely visualized, reportedly surgically absent. Left colonic diverticulosis, without evidence of diverticulitis. Vascular/Lymphatic: No evidence of abdominal aortic aneurysm. Atherosclerotic calcifications of the abdominal aorta and branch vessels. Occlusion of the main portal vein, splenic vein, and SMV, now  with collateralization. No suspicious abdominopelvic lymphadenopathy. Reproductive: Status post hysterectomy. No adnexal masses. Other: Small volume pelvic ascites. Musculoskeletal: Mild degenerative changes at L5-S1. IMPRESSION: Chronic occlusion of the portal vein, splenic vein, and SMV, now with collateralization. Cholelithiasis, without associated inflammatory changes. No evidence of bowel obstruction. Prior appendectomy. Left colonic diverticulosis, without evidence of diverticulitis. Small volume pelvic ascites. Electronically Signed   By: Charline Bills M.D.   On: 08/06/2018 20:56    Procedures Procedures (including critical care time)  Medications Ordered in ED Medications  iohexol (OMNIPAQUE) 300 MG/ML solution 100 mL (100 mLs Intravenous Contrast Given 08/06/18 2024)     Initial Impression / Assessment and Plan / ED Course  I have reviewed the triage vital signs and the nursing notes.  Pertinent labs & imaging results that were available during my care of the patient were reviewed by me and considered in my medical decision making (see chart for details).     MDM  Ct scan obtained. Ct shows gallstones, no change in portal vein thrombosis.   Pt counseled on results.  Pt advised to follow up with general surgery.    Pt's care turned over to Elpidio Anis PA at 10:30pm.  Ua pending.   Final Clinical Impressions(s) / ED Diagnoses   Final diagnoses:  Calculus of gallbladder without cholecystitis without obstruction  Generalized abdominal pain    ED Discharge Orders    None       Elson Areas, PA-C 08/06/18 2250    Virgina Norfolk, DO 08/06/18 2328

## 2018-08-06 NOTE — Discharge Instructions (Signed)
See your Physician for recheck.  See your surgeon for evaluation of gallbladder disease and gallstones

## 2018-08-06 NOTE — ED Notes (Signed)
Called CT to let them know pt had IV and is ready to scan.

## 2018-08-06 NOTE — ED Notes (Signed)
Pt made aware of the need for urine.

## 2018-08-06 NOTE — ED Notes (Signed)
Two sticks, with two blown veins.

## 2018-08-06 NOTE — ED Notes (Signed)
Patient transported to CT 

## 2018-08-06 NOTE — ED Notes (Signed)
Pt alert and oriented in NAD. Pt verbalized understanding of discharge instructions. 

## 2018-08-06 NOTE — ED Triage Notes (Signed)
Pt BIB POV for eval of sudden onset abd pain @ 1500 while out shopping. Pt reports hx of nissun fundoplication on 5/20. Reports had a portal vein thrombosis afterwards, started on eliquis d/t clot.  States this pain feels similar to portal vein thrombosis. Pt endorses nausea, no vomiting.

## 2018-08-06 NOTE — ED Provider Notes (Signed)
Patient placed in Quick Look pathway, seen and evaluated   Chief Complaint: abdominal pain  HPI:  Whitney Peters is a 57 y.o. female who presents to the ED with sudden onset abd pain @ 1500 while out shopping. Pt reports hx of Nissen fundoplication on 5/20. Reports had a portal vein thrombosis afterwards, started on eliquis d/t clot.  States this pain feels similar to portal vein thrombosis. Pt endorses nausea, no vomiting.   ROS: GI: abdominal pain  Physical Exam:  BP (!) 145/76 (BP Location: Right Arm)   Pulse 75   Temp 98.1 F (36.7 C) (Oral)   Resp 20   Ht 5\' 9"  (1.753 m)   Wt 73 kg   SpO2 100%   BMI 23.78 kg/m    Gen: No distress  Neuro: Awake and Alert  Skin: Warm and dry  Abdomen: soft, tender with palpation periumbilical    Initiation of care has begun. The patient has been counseled on the process, plan, and necessity for staying for the completion/evaluation, and the remainder of the medical screening examination    Janne Napoleon, NP 08/06/18 1603    Shaune Pollack, MD 08/07/18 0005

## 2018-08-22 DIAGNOSIS — Z01419 Encounter for gynecological examination (general) (routine) without abnormal findings: Secondary | ICD-10-CM | POA: Diagnosis not present

## 2018-09-11 ENCOUNTER — Other Ambulatory Visit (HOSPITAL_BASED_OUTPATIENT_CLINIC_OR_DEPARTMENT_OTHER): Payer: Self-pay | Admitting: Obstetrics & Gynecology

## 2018-09-11 DIAGNOSIS — Z1231 Encounter for screening mammogram for malignant neoplasm of breast: Secondary | ICD-10-CM

## 2018-09-17 ENCOUNTER — Ambulatory Visit (HOSPITAL_BASED_OUTPATIENT_CLINIC_OR_DEPARTMENT_OTHER)
Admission: RE | Admit: 2018-09-17 | Discharge: 2018-09-17 | Disposition: A | Discharge status: Home / Self Care | Payer: Federal, State, Local not specified - PPO | Source: Ambulatory Visit | Attending: Obstetrics & Gynecology | Admitting: Obstetrics & Gynecology

## 2018-09-17 DIAGNOSIS — Z1231 Encounter for screening mammogram for malignant neoplasm of breast: Secondary | ICD-10-CM

## 2018-10-16 DIAGNOSIS — I8310 Varicose veins of unspecified lower extremity with inflammation: Secondary | ICD-10-CM | POA: Diagnosis not present

## 2018-10-16 DIAGNOSIS — D225 Melanocytic nevi of trunk: Secondary | ICD-10-CM | POA: Diagnosis not present

## 2018-10-16 DIAGNOSIS — Z1283 Encounter for screening for malignant neoplasm of skin: Secondary | ICD-10-CM | POA: Diagnosis not present

## 2018-10-16 DIAGNOSIS — L820 Inflamed seborrheic keratosis: Secondary | ICD-10-CM | POA: Diagnosis not present

## 2018-11-22 ENCOUNTER — Inpatient Hospital Stay (HOSPITAL_COMMUNITY): Payer: Federal, State, Local not specified - PPO | Attending: Hematology

## 2018-11-22 DIAGNOSIS — Z86718 Personal history of other venous thrombosis and embolism: Secondary | ICD-10-CM | POA: Diagnosis not present

## 2018-11-22 DIAGNOSIS — Z7901 Long term (current) use of anticoagulants: Secondary | ICD-10-CM | POA: Insufficient documentation

## 2018-11-22 DIAGNOSIS — I81 Portal vein thrombosis: Secondary | ICD-10-CM | POA: Insufficient documentation

## 2018-11-22 DIAGNOSIS — Z86711 Personal history of pulmonary embolism: Secondary | ICD-10-CM | POA: Insufficient documentation

## 2018-11-22 DIAGNOSIS — Z87891 Personal history of nicotine dependence: Secondary | ICD-10-CM | POA: Diagnosis not present

## 2018-11-22 LAB — D-DIMER, QUANTITATIVE: D-Dimer, Quant: 0.6 ug/mL-FEU — ABNORMAL HIGH (ref 0.00–0.50)

## 2018-11-29 ENCOUNTER — Encounter (HOSPITAL_COMMUNITY): Payer: Self-pay

## 2018-11-29 ENCOUNTER — Encounter (HOSPITAL_COMMUNITY): Payer: Self-pay | Admitting: Hematology

## 2018-11-29 ENCOUNTER — Other Ambulatory Visit: Payer: Self-pay

## 2018-11-29 ENCOUNTER — Inpatient Hospital Stay (HOSPITAL_BASED_OUTPATIENT_CLINIC_OR_DEPARTMENT_OTHER): Payer: Federal, State, Local not specified - PPO | Admitting: Hematology

## 2018-11-29 VITALS — BP 126/71 | HR 67 | Temp 97.8°F | Resp 16 | Wt 163.0 lb

## 2018-11-29 DIAGNOSIS — Z7901 Long term (current) use of anticoagulants: Secondary | ICD-10-CM | POA: Diagnosis not present

## 2018-11-29 DIAGNOSIS — Z87891 Personal history of nicotine dependence: Secondary | ICD-10-CM | POA: Diagnosis not present

## 2018-11-29 DIAGNOSIS — Z86718 Personal history of other venous thrombosis and embolism: Secondary | ICD-10-CM | POA: Diagnosis not present

## 2018-11-29 DIAGNOSIS — Z86711 Personal history of pulmonary embolism: Secondary | ICD-10-CM | POA: Diagnosis not present

## 2018-11-29 DIAGNOSIS — I81 Portal vein thrombosis: Secondary | ICD-10-CM | POA: Diagnosis not present

## 2018-11-29 NOTE — Assessment & Plan Note (Signed)
1.  Hypercoagulable state: - Unprovoked pulmonary embolism in August 2014, diagnosed in [place], started on Coumadin (could not tolerate Xarelto and Pradaxa due to nausea) - Developed femoral vein DVT on 04/10/2014 while patient on Coumadin, subsequently changed to Eliquis, discontinued in October 2015. -Hypercoagulable work-up on 09/15/2014 while patient off anticoagulation shows normal protein C, protein S, negative lupus anticoagulant, negative beta-2 glycoprotein 1 antibody, negative anticardiolipin 1 antibody, negative factor V Leiden and PT 20210A mutation - She underwent Nissen fundoplication for acid reflux on 03/06/2018, stayed in the hospital for 2 to 3 days, received Lovenox prophylaxis doses. - Developed severe abdominal pain, admitted to hospital on 03/25/2018, CT scan of the abdomen on 03/26/2018 shows partial portal vein thrombus within the intrahepatic right portal vein, extends to extrahepatic main portal vein and possibly SMV.  Patient was given Lovenox and transitioned to Eliquis which she is taking now. - As she had 2 unprovoked DVTs, I have recommended indefinite anticoagulation.   - We have checked Jak 2 and other mutation testing which was negative. -Lower extremity Doppler on 05/22/2018 was negative for blood clots. -D-dimer today was elevated at 0.6.  She is tolerating Eliquis very well without any bleeding issues. -She is having some issues with her left hip.  She reports that she might need surgical intervention at some point.  She will contact us when the need arises. -She will come back in 1 year for follow-up.

## 2018-11-29 NOTE — Patient Instructions (Signed)
[facility] Discharge Instructions     Thank you for choosing [facility] at [facility] to provide your oncology and hematology care.  To afford each patient quality time with our provider, please arrive at least 15 minutes before your scheduled appointment time.   If you have a lab appointment with the Cancer Center please come in thru the  Main Entrance and check in at the main information desk  You need to re-schedule your appointment should you arrive 10 or more minutes late.  We strive to give you quality time with our providers, and arriving late affects you and other patients whose appointments are after yours.  Also, if you no show three or more times for appointments you may be dismissed from the clinic at the providers discretion.     Again, thank you for choosing [facility].  Our hope is that these requests will decrease the amount of time that you wait before being seen by our physicians.       _____________________________________________________________  Should you have questions after your visit to [facility], please contact our office at [phone] between the hours of 8:00 a.m. and 4:30 p.m.  Voicemails left after 4:00 p.m. will not be returned until the following business day.  For prescription refill requests, have your pharmacy contact our office and allow 72 hours.    Cancer Center Support Programs:   > Cancer Support Group  2nd Tuesday of the month 1pm-2pm, Journey Room    

## 2018-11-29 NOTE — Progress Notes (Signed)
[facility] [address] [place], [place] [address]   CLINIC:  Medical Oncology/Hematology  PCP:  Lenell AntuLe, Thao P, DO [address] [place] [id number] [phone]   REASON FOR VISIT: Follow-up for portal vein thrombosis  CURRENT THERAPY: Eliquis  INTERVAL HISTORY:  [name] 58 y.o. female returns for routine follow-up for portal vein thrombosis. She is here today and doing well she is tolerating the Eliquis well. Denies any bruising. Denies any nausea, vomiting, or diarrhea. Denies any new pains. Had not noticed any recent bleeding such as epistaxis, hematuria or hematochezia. Denies recent chest pain on exertion, shortness of breath on minimal exertion, pre-syncopal episodes, or palpitations. Denies any numbness or tingling in hands or feet. Denies any recent fevers, infections, or recent hospitalizations. Patient reports appetite at 100% and energy level at 100%.   REVIEW OF SYSTEMS:  Review of Systems  All other systems reviewed and are negative.    PAST MEDICAL/SURGICAL HISTORY:  Past Medical History:  Diagnosis Date  . Allergy    seasonal  . Anticoagulated on warfarin    off since 2015  . Arthritis    ankle- left   . Asthma    no problems in years, rarely uses inhaler  . Clotting disorder (HCC)    DVT,PE  . DVT (deep venous thrombosis) (HCC)    left leg in 2014;  no DVT now    . Family history of breast cancer   . GERD (gastroesophageal reflux disease)   . Heart murmur    pt denies  . Hyperlipidemia   . PE (pulmonary embolism) 04/2014   gone   . Ruptured or perforated eardrum, bilateral   . Uses hearing aid    bilateral  . Wears partial dentures    upper dentures and lower partial   Past Surgical History:  Procedure Laterality Date  . 24 HOUR PH STUDY N/A 01/02/2018   Procedure: 24 HOUR PH STUDY;  Surgeon: Napoleon FormNandigam, Kavitha V, MD;  Location: [facility]COPY;  Service: Endoscopy;  Laterality: N/A;  . ABDOMINAL HYSTERECTOMY    .  APPENDECTOMY    . COLONOSCOPY    . ESOPHAGEAL MANOMETRY N/A 01/02/2018   Procedure: ESOPHAGEAL MANOMETRY (EM);  Surgeon: Napoleon FormNandigam, Kavitha V, MD;  Location: [facility];  Service: Endoscopy;  Laterality: N/A;  . ESOPHAGOGASTRODUODENOSCOPY (EGD) WITH ESOPHAGEAL DILATION     x6  . FOOT SURGERY Bilateral    BONE SPURS  . KNEE SURGERY Right 2011   arthroscopy ; [place]   . OPEN REDUCTION INTERNAL FIXATION (ORIF) DISTAL RADIAL FRACTURE Right 12/02/2014   Procedure: OPEN REDUCTION INTERNAL FIXATION (ORIF) DISTAL RADIAL FRACTURE;  Surgeon: Eldred MangesMark C Yates, MD;  Location: [facility] OR;  Service: Orthopedics;  Laterality: Right;  . UPPER GASTROINTESTINAL ENDOSCOPY    . WRIST SURGERY     "broke radius bone and theres plates and screws there now ;  Dr Ophelia CharterYates did"      SOCIAL HISTORY:  Social History   Socioeconomic History  . Marital status: Married    Spouse name: Not on file  . Number of children: Not on file  . Years of education: Not on file  . Highest education level: Not on file  Occupational History  . Occupation: Consulting civil engineertudent     Comment: CMA  Social Needs  . Financial resource strain: Not on file  . Food insecurity:    Worry: Not on file    Inability: Not on file  . Transportation  needs:    Medical: Not on file    Non-medical: Not on file  Tobacco Use  . Smoking status: Former Smoker    Packs/day: 20.00    Types: Cigarettes    Last attempt to quit: 11/07/1991    Years since quitting: 27.0  . Smokeless tobacco: Never Used  Substance and Sexual Activity  . Alcohol use: No    Alcohol/week: 0.0 standard drinks  . Drug use: No  . Sexual activity: Yes    Birth control/protection: Surgical, Other-see comments    Comment: Hysterectomy  Lifestyle  . Physical activity:    Days per week: Not on file    Minutes per session: Not on file  . Stress: Not on file  Relationships  . Social connections:    Talks on phone: Not on file    Gets together: Not on file    Attends religious  service: Not on file    Active member of club or organization: Not on file    Attends meetings of clubs or organizations: Not on file    Relationship status: Not on file  . Intimate partner violence:    Fear of current or ex partner: Not on file    Emotionally abused: Not on file    Physically abused: Not on file    Forced sexual activity: Not on file  Other Topics Concern  . Not on file  Social History Narrative  . Not on file    FAMILY HISTORY:  Family History  Problem Relation Age of Onset  . Heart disease Mother   . Dementia Mother        deceased 2077  . Heart disease Father        PACEMAKER  . Cancer Father        penile cancer; currently 6783  . Breast cancer Sister [id number]       currently 6456  . Breast cancer Maternal Grandmother [id number]       deceased 6570s  . Diabetes Maternal Grandmother   . Esophageal cancer Maternal Uncle   . Colon cancer Neg Hx   . Colon polyps Neg Hx   . Rectal cancer Neg Hx   . Stomach cancer Neg Hx     CURRENT MEDICATIONS:  Outpatient Encounter Medications as of 11/29/2018  Medication Sig  . apixaban (ELIQUIS) 5 MG TABS tablet Take 1 tablet (5 mg total) by mouth 2 (two) times daily.  . valACYclovir (VALTREX) 500 MG tablet TAKE 1 TABLET BY MOUTH ONCE DAILY FOR 90 DAYS  . acetaminophen (TYLENOL) 325 MG tablet Take 650 mg by mouth every 6 (six) hours as needed (for pain or headaches).  . [DISCONTINUED] BIOTIN PO Take 1 tablet by mouth daily.  . [DISCONTINUED] traMADol (ULTRAM) 50 MG tablet Take 1 tablet (50 mg total) by mouth every 6 (six) hours as needed. (Patient not taking: Reported on 08/06/2018)   No facility-administered encounter medications on file as of 11/29/2018.     ALLERGIES:  Allergies  Allergen Reactions  . Xarelto [Rivaroxaban] Nausea Only  . Chocolate Diarrhea  . Lactose Intolerance (Gi) Diarrhea  . Meat [Alpha-Gal] Nausea Only and Other (See Comments)    And stomach pain  . Pradaxa [Dabigatran Etexilate Mesylate] Nausea Only      PHYSICAL EXAM:  ECOG Performance status: 1  Vitals:   11/29/18 1100  BP: 126/71  Pulse: 67  Resp: 16  Temp: 97.8 F (36.6 C)  SpO2: 97%   Filed Weights   11/29/18 1100  Weight: 163 lb (73.9 kg)    Physical Exam Constitutional:      Appearance: Normal appearance. She is normal weight.  Cardiovascular:     Rate and Rhythm: Normal rate and regular rhythm.     Heart sounds: Normal heart sounds.  Pulmonary:     Effort: Pulmonary effort is normal.     Breath sounds: Normal breath sounds.  Abdominal:     General: Abdomen is flat.     Palpations: Abdomen is soft.  Musculoskeletal: Normal range of motion.  Skin:    General: Skin is warm and dry.  Neurological:     Mental Status: She is alert and oriented to person, place, and time. Mental status is at baseline.  Psychiatric:        Mood and Affect: Mood normal.        Behavior: Behavior normal.        Thought Content: Thought content normal.        Judgment: Judgment normal.      LABORATORY DATA:  I have reviewed the labs as listed.  CBC    Component Value Date/Time   WBC 8.2 08/06/2018 1604   RBC 4.52 08/06/2018 1604   HGB 13.6 08/06/2018 1604   HCT 41.6 08/06/2018 1604   PLT 180 08/06/2018 1604   MCV 92.0 08/06/2018 1604   MCH 30.1 08/06/2018 1604   MCHC 32.7 08/06/2018 1604   RDW 13.8 08/06/2018 1604   LYMPHSABS 2.9 03/25/2018 2019   MONOABS 1.1 (H) 03/25/2018 2019   EOSABS 0.0 03/25/2018 2019   BASOSABS 0.0 03/25/2018 2019   CMP Latest Ref Rng & Units 08/06/2018 03/25/2018 03/07/2018  Glucose 70 - 99 mg/dL 92 510(C) 585(I)  BUN 6 - 20 mg/dL <7(P) 7 10  Creatinine 0.44 - 1.00 mg/dL 8.24 2.35 3.61  Sodium 135 - 145 mmol/L 139 136 141  Potassium 3.5 - 5.1 mmol/L 3.3(L) 3.7 4.1  Chloride 98 - 111 mmol/L 107 103 110  CO2 22 - 32 mmol/L 25 21(L) 23  Calcium 8.9 - 10.3 mg/dL 9.2 9.0 4.4(R)  Total Protein 6.5 - 8.1 g/dL 6.7 7.5 -  Total Bilirubin 0.3 - 1.2 mg/dL 0.9 0.9 -  Alkaline Phos 38 - 126 U/L 124  96 -  AST 15 - 41 U/L 23 14(L) -  ALT 0 - 44 U/L 20 16 -       DIAGNOSTIC IMAGING:  I have independently reviewed the scans and discussed with the patient.   I have reviewed Mathis Bud, NP's note and agree with the documentation.  I personally performed a face-to-face visit, made revisions and my assessment and plan is as follows.    ASSESSMENT & PLAN:   Portal vein thrombosis 1.  Hypercoagulable state: - Unprovoked pulmonary embolism in August 2014, diagnosed in [facility], started on Coumadin (could not tolerate Xarelto and Pradaxa due to nausea) - Developed femoral vein DVT on 04/10/2014 while patient on Coumadin, subsequently changed to Eliquis, discontinued in October 2015. -Hypercoagulable work-up on 09/15/2014 while patient off anticoagulation shows normal protein C, protein S, negative lupus anticoagulant, negative beta-2 glycoprotein 1 antibody, negative anticardiolipin 1 antibody, negative factor V Leiden and PT 20210A mutation - She underwent Nissen fundoplication for acid reflux on 03/06/2018, stayed in the hospital for 2 to 3 days, received Lovenox prophylaxis doses. - Developed severe abdominal pain, admitted to hospital on 03/25/2018, CT scan of the abdomen on 03/26/2018 shows partial portal vein thrombus within the intrahepatic right portal vein, extends to  extrahepatic main portal vein and possibly SMV.  Patient was given Lovenox and transitioned to Eliquis which she is taking now. - As she had 2 unprovoked DVTs, I have recommended indefinite anticoagulation.   - We have checked Jak 2 and other mutation testing which was negative. -Lower extremity Doppler on 05/22/2018 was negative for blood clots. -D-dimer today was elevated at 0.6.  She is tolerating Eliquis very well without any bleeding issues. -She is having some issues with her left hip.  She reports that she might need surgical intervention at some point.  She will contact us when the need arises. -She  will come back in 1 year for follow-up.       Orders placed this encounter:  Orders Placed This Encounter  Procedures  . CBC with Differential/Platelet  . Comprehensive metabolic panel      Doreatha Massed, MD [facility] [phone]

## 2018-12-11 DIAGNOSIS — M545 Low back pain: Secondary | ICD-10-CM | POA: Diagnosis not present

## 2018-12-18 ENCOUNTER — Encounter: Payer: Federal, State, Local not specified - PPO | Admitting: Vascular Surgery

## 2018-12-18 ENCOUNTER — Encounter (HOSPITAL_COMMUNITY): Payer: Federal, State, Local not specified - PPO

## 2018-12-24 ENCOUNTER — Other Ambulatory Visit: Payer: Self-pay

## 2018-12-24 DIAGNOSIS — I83893 Varicose veins of bilateral lower extremities with other complications: Secondary | ICD-10-CM

## 2018-12-25 ENCOUNTER — Ambulatory Visit (HOSPITAL_COMMUNITY)
Admission: RE | Admit: 2018-12-25 | Discharge: 2018-12-25 | Disposition: A | Discharge status: Home / Self Care | Payer: Federal, State, Local not specified - PPO | Source: Ambulatory Visit | Attending: Vascular Surgery | Admitting: Vascular Surgery

## 2018-12-25 DIAGNOSIS — I83893 Varicose veins of bilateral lower extremities with other complications: Secondary | ICD-10-CM | POA: Diagnosis not present

## 2018-12-26 ENCOUNTER — Ambulatory Visit (INDEPENDENT_AMBULATORY_CARE_PROVIDER_SITE_OTHER): Payer: Federal, State, Local not specified - PPO | Admitting: Vascular Surgery

## 2018-12-26 ENCOUNTER — Other Ambulatory Visit: Payer: Self-pay

## 2018-12-26 ENCOUNTER — Encounter: Payer: Self-pay | Admitting: Vascular Surgery

## 2018-12-26 VITALS — BP 121/82 | HR 71 | Temp 97.0°F | Resp 16 | Ht 69.0 in | Wt 160.0 lb

## 2018-12-26 DIAGNOSIS — I83812 Varicose veins of left lower extremities with pain: Secondary | ICD-10-CM

## 2018-12-26 NOTE — Progress Notes (Signed)
REASON FOR CONSULT:    Painful varicose veins.  The patient is self-referred.  HPI:   Whitney Peters is a pleasant 58 y.o. female, who presents with complaints of pain along her lateral left thigh associated with some spider veins.  This patient has a significant history and that she had a pulmonary embolus in 2014.  This was initially treated with warfarin.  In 2015 she had a right lower extremity DVT.  She had been on Xarelto but did not tolerate this.  She was later switched to Pradaxa but did not tolerate this.  She is currently on Eliquis.  This was just started last year after she developed portal vein thrombosis after a Niesen fundoplication which was done in May 2019.  Patient describes aching pain and heaviness mostly in the left lateral thigh which is aggravated by sitting and standing and relieved somewhat with elevation.  She has not worn compression stockings.  She does elevate her legs some.  She takes ibuprofen as needed for pain.  She has lost approximately 40 pounds since her surgery.  She has not had any previous venous procedures.  Past Medical History:  Diagnosis Date  . Allergy    seasonal  . Anticoagulated on warfarin    off since 2015  . Arthritis    ankle- left   . Asthma    no problems in years, rarely uses inhaler  . Clotting disorder (HCC)    DVT,PE  . DVT (deep venous thrombosis) (HCC)    left leg in 2014;  no DVT now    . Family history of breast cancer   . GERD (gastroesophageal reflux disease)   . Heart murmur    pt denies  . Hyperlipidemia   . PE (pulmonary embolism) 04/2014   gone   . Ruptured or perforated eardrum, bilateral   . Uses hearing aid    bilateral  . Wears partial dentures    upper dentures and lower partial    Family History  Problem Relation Age of Onset  . Heart disease Mother   . Dementia Mother        deceased 5877  . Heart disease Father        PACEMAKER  . Cancer Father        penile cancer; currently 4383  .  Breast cancer Sister 2438       currently 6756  . Breast cancer Maternal Grandmother [id number]       deceased 2470s  . Diabetes Maternal Grandmother   . Esophageal cancer Maternal Uncle   . Colon cancer Neg Hx   . Colon polyps Neg Hx   . Rectal cancer Neg Hx   . Stomach cancer Neg Hx     SOCIAL HISTORY: Social History   Socioeconomic History  . Marital status: Married    Spouse name: Not on file  . Number of children: Not on file  . Years of education: Not on file  . Highest education level: Not on file  Occupational History  . Occupation: Consulting civil engineertudent     Comment: CMA  Social Needs  . Financial resource strain: Not on file  . Food insecurity:    Worry: Not on file    Inability: Not on file  . Transportation needs:    Medical: Not on file    Non-medical: Not on file  Tobacco Use  . Smoking status: Former Smoker    Packs/day: 20.00    Types: Cigarettes    Last attempt to  quit: 11/07/1991    Years since quitting: 27.1  . Smokeless tobacco: Never Used  Substance and Sexual Activity  . Alcohol use: No    Alcohol/week: 0.0 standard drinks  . Drug use: No  . Sexual activity: Yes    Birth control/protection: Surgical, Other-see comments    Comment: Hysterectomy  Lifestyle  . Physical activity:    Days per week: Not on file    Minutes per session: Not on file  . Stress: Not on file  Relationships  . Social connections:    Talks on phone: Not on file    Gets together: Not on file    Attends religious service: Not on file    Active member of club or organization: Not on file    Attends meetings of clubs or organizations: Not on file    Relationship status: Not on file  . Intimate partner violence:    Fear of current or ex partner: Not on file    Emotionally abused: Not on file    Physically abused: Not on file    Forced sexual activity: Not on file  Other Topics Concern  . Not on file  Social History Narrative  . Not on file    Allergies  Allergen Reactions  . Xarelto  [Rivaroxaban] Nausea Only  . Chocolate Diarrhea  . Lactose Intolerance (Gi) Diarrhea  . Meat [Alpha-Gal] Nausea Only and Other (See Comments)    And stomach pain  . Pradaxa [Dabigatran Etexilate Mesylate] Nausea Only    Current Outpatient Medications  Medication Sig Dispense Refill  . acetaminophen (TYLENOL) 325 MG tablet Take 650 mg by mouth every 6 (six) hours as needed (for pain or headaches).    Marland Kitchen apixaban (ELIQUIS) 5 MG TABS tablet Take 1 tablet (5 mg total) by mouth 2 (two) times daily. 60 tablet 2  . methocarbamol (ROBAXIN) 750 MG tablet TAKE 1 TABLET BY MOUTH EVERY 6 HOURS AS NEEDED FOR 5 DAYS    . metroNIDAZOLE (METROGEL) 0.75 % vaginal gel INSERT 1 APPLICATORFUL VAGINALLY AT BEDTIME FOR 5 DAYS    . predniSONE (STERAPRED UNI-PAK 48 TAB) 10 MG (48) TBPK tablet USE AS DIRECTED WITH FOOD FOR 12 DAYS    . valACYclovir (VALTREX) 500 MG tablet TAKE 1 TABLET BY MOUTH ONCE DAILY FOR 90 DAYS     No current facility-administered medications for this visit.     REVIEW OF SYSTEMS:  [X]  denotes positive finding, [ ]  denotes negative finding Cardiac  Comments:  Chest pain or chest pressure:    Shortness of breath upon exertion:    Short of breath when lying flat:    Irregular heart rhythm:        Vascular    Pain in calf, thigh, or hip brought on by ambulation: x   Pain in feet at night that wakes you up from your sleep:     Blood clot in your veins: x   Leg swelling:         Pulmonary    Oxygen at home:    Productive cough:     Wheezing:         Neurologic    Sudden weakness in arms or legs:     Sudden numbness in arms or legs:     Sudden onset of difficulty speaking or slurred speech:    Temporary loss of vision in one eye:     Problems with dizziness:         Gastrointestinal    Blood  in stool:     Vomited blood:         Genitourinary    Burning when urinating:     Blood in urine:        Psychiatric    Major depression:         Hematologic    Bleeding problems:     Problems with blood clotting too easily:        Skin    Rashes or ulcers:        Constitutional    Fever or chills:     PHYSICAL EXAM:   Vitals:   12/26/18 1309  BP: 121/82  Pulse: 71  Resp: 16  Temp: (!) 97 F (36.1 C)  TempSrc: Oral  SpO2: 97%  Weight: 160 lb (72.6 kg)  Height: 5\' 9"  (1.753 m)    GENERAL: The patient is a well-nourished female, in no acute distress. The vital signs are documented above. CARDIAC: There is a regular rate and rhythm.  VASCULAR: I do not detect carotid bruits. She has palpable dorsalis pedis and posterior tibial pulses bilaterally. VENOUS EXAM: She has some spider veins along her lateral left thigh and anterior left leg.  She also has spider veins popliteal Fossa bilaterally.  I do not see any large truncal varicosities.  She does not have any significant leg swelling or hyperpigmentation. I looked at her left great saphenous vein myself with the SonoSite.  This had reflux from the distal thigh to the saphenofemoral junction.  The vein is quite small except right in the very proximal thigh. PULMONARY: There is good air exchange bilaterally without wheezing or rales. ABDOMEN: Soft and non-tender with normal pitched bowel sounds.  MUSCULOSKELETAL: There are no major deformities or cyanosis. NEUROLOGIC: No focal weakness or paresthesias are detected. SKIN: There are no ulcers or rashes noted. PSYCHIATRIC: The patient has a normal affect.  DATA:    VENOUS DUPLEX: I have independently interpreted her venous duplex scan.  On the right side there is no evidence of deep venous thrombosis or superficial venous thrombosis.  There is deep venous reflux on the right involving the common femoral vein and popliteal vein.  There is reflux at the right saphenofemoral junction and in the proximal calf and the great saphenous vein.  On the left side there is no evidence of DVT or superficial thrombophlebitis.  There is deep venous reflux involving the  common femoral vein and popliteal vein.  There is reflux at the saphenofemoral junction on the left.  There is reflux in the great saphenous vein from the distal thigh to the saphenofemoral junction.  The diameters of the left great saphenous vein in the thigh range from 0.28 at the knee to a maximum diameter of 0.524 in the proximal thigh.   ASSESSMENT & PLAN:   CHRONIC VENOUS INSUFFICIENCY: This patient does have deep and superficial venous reflux bilaterally.  She has CEAP C 2 venous disease.  We have discussed the importance of intermittent leg elevation the proper positioning for this.  I have also written her a prescription for knee-high compression stockings with a gradient of 15 to 20 mmHg.  I have encouraged her to avoid prolonged sitting and standing.  We have discussed the importance of exercise specifically walking and water aerobics.  We also discussed the importance of careful weight management as abdominal obesity especially increases venous hypertension.  She could be considered for sclerotherapy of her spider veins however I think she would be at increased risk for  recurrence given that she does have some deep and superficial venous reflux.  Currently the great saphenous vein on the left where she has significant reflux is fairly small and I do not think she is a good candidate for laser ablation.  Certainly if her venous disease progresses in the future we could repeat her study and see if this has progressed.  I be happy to see her back in any time if any new vascular issues arise.  Waverly Ferrarihristopher Emlyn Maves [facility] [phone]

## 2019-01-20 IMAGING — MG DIGITAL SCREENING BILATERAL MAMMOGRAM WITH TOMO AND CAD
8 series · 8 of 24 positions shown · non-contrast
Comparison: Previous exam(s).

CLINICAL DATA: Screening.

EXAM:
DIGITAL SCREENING BILATERAL MAMMOGRAM WITH TOMO AND CAD

[R MLO synth-2D]
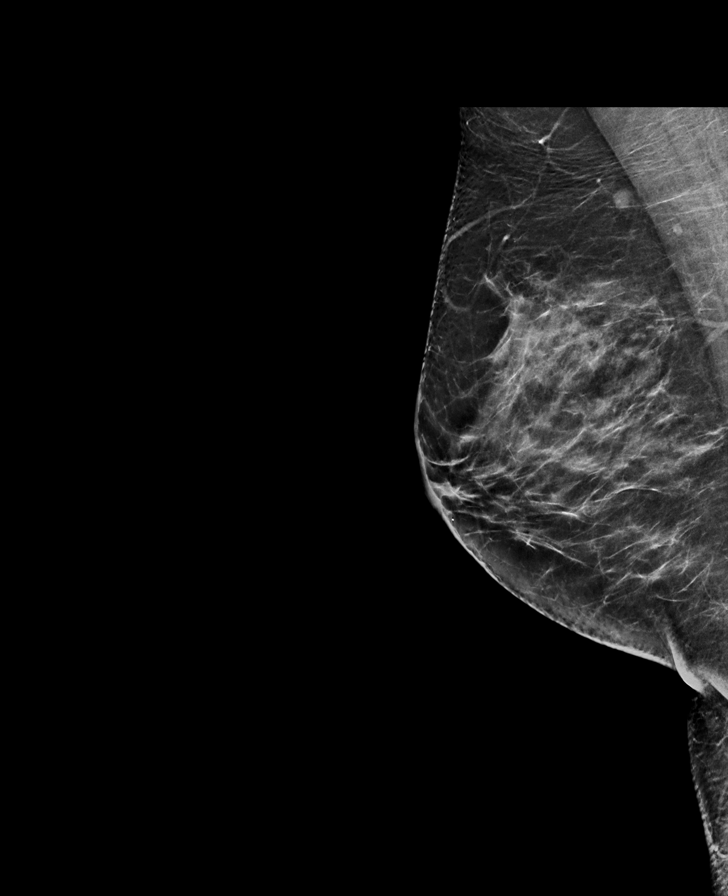

[R CC synth-2D]
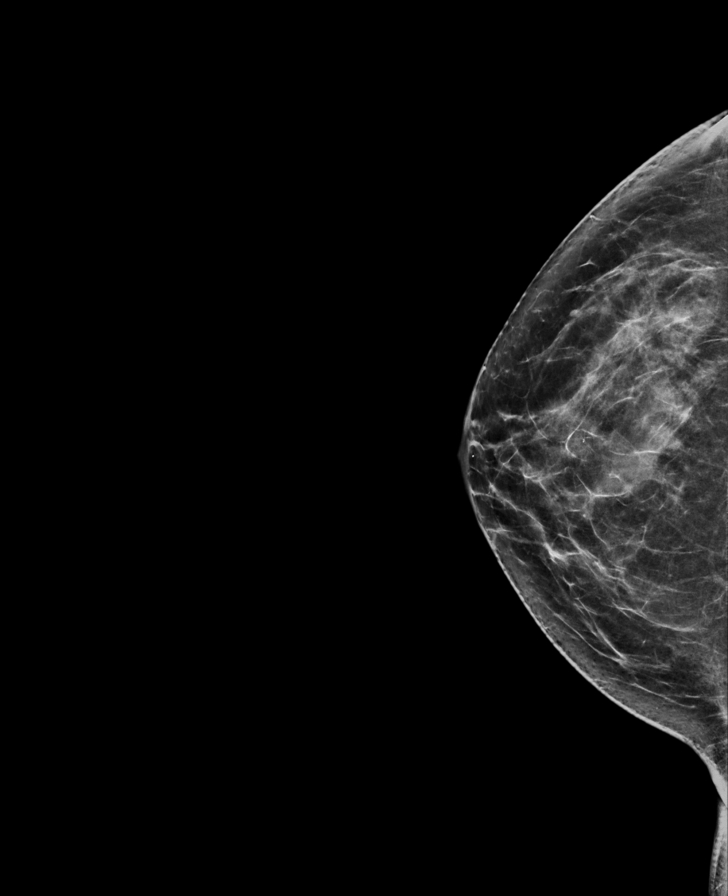

[L MLO synth-2D]
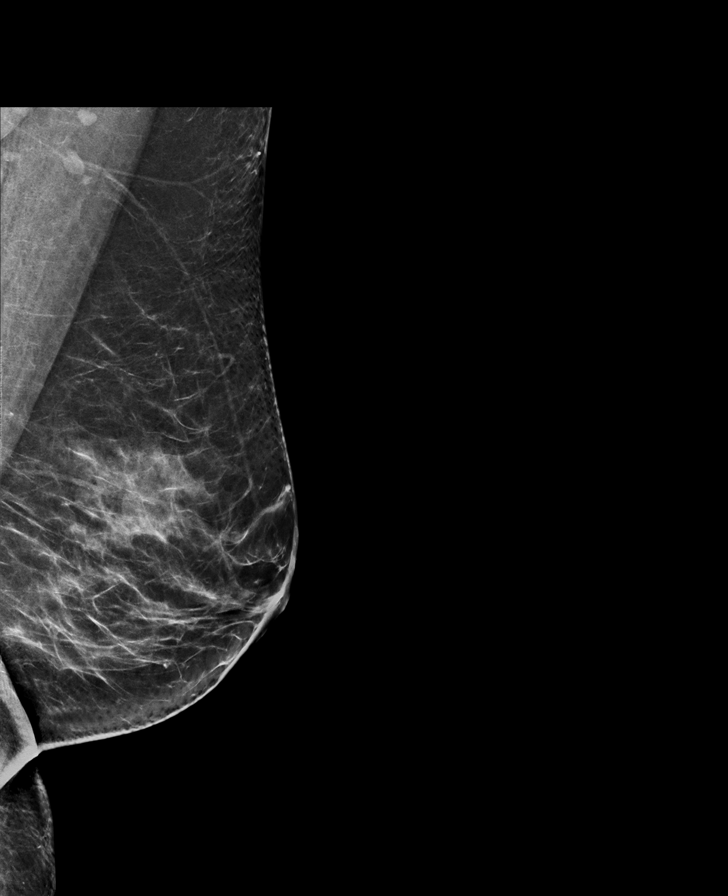

[L CC synth-2D]
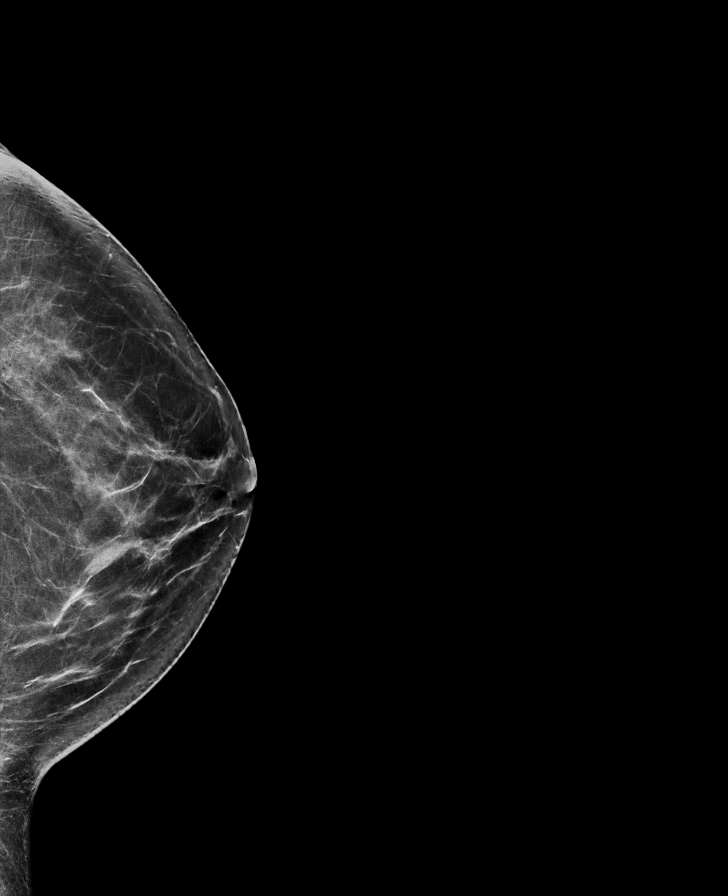

[R MLO tomo · tomo slice 35/69.0]
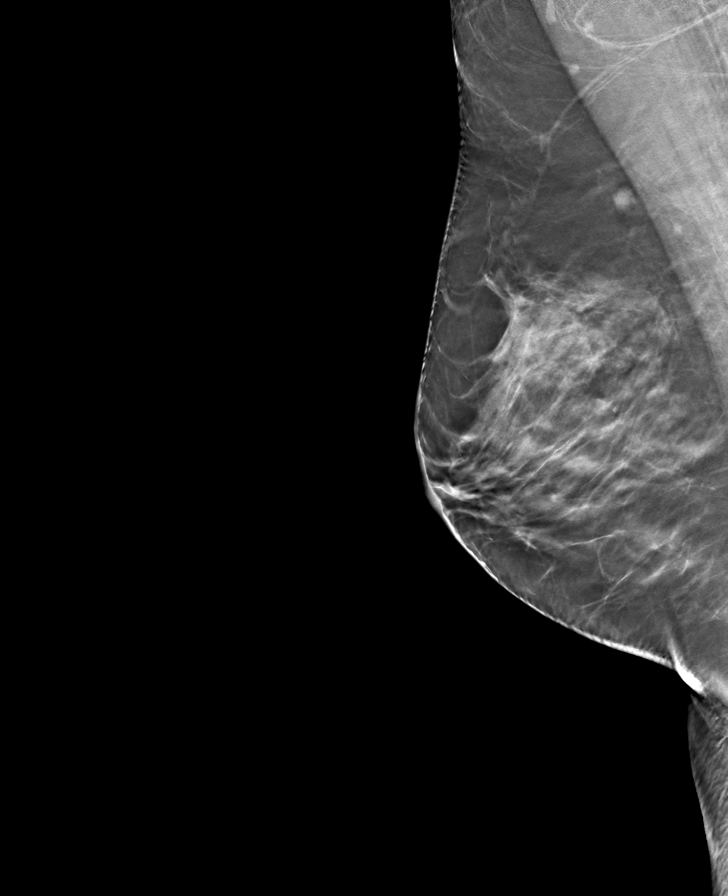

[L MLO tomo · tomo slice 37/73.0]
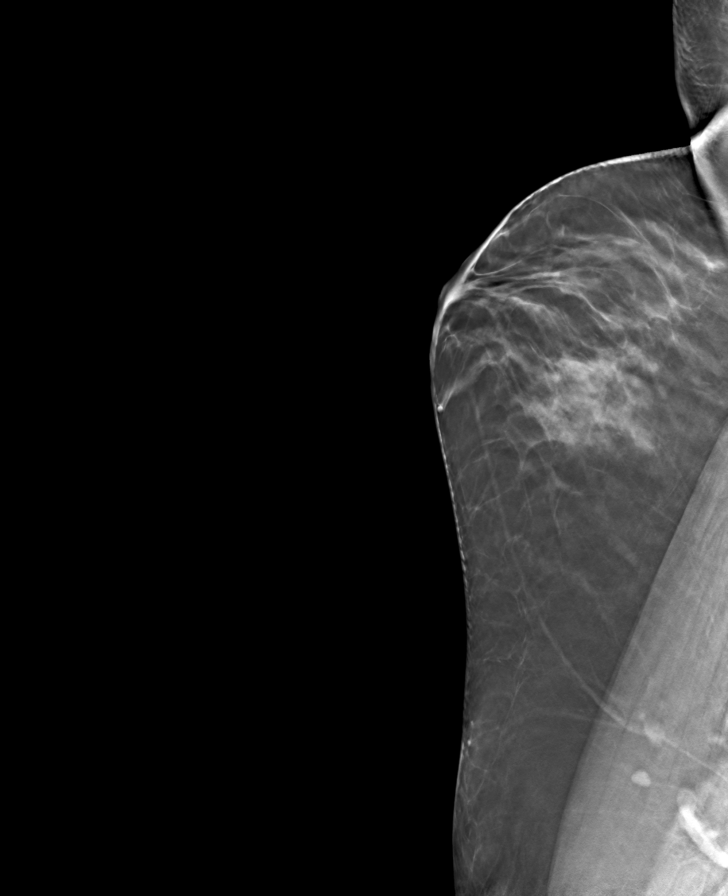

[R CC tomo · tomo slice 34/67.0]
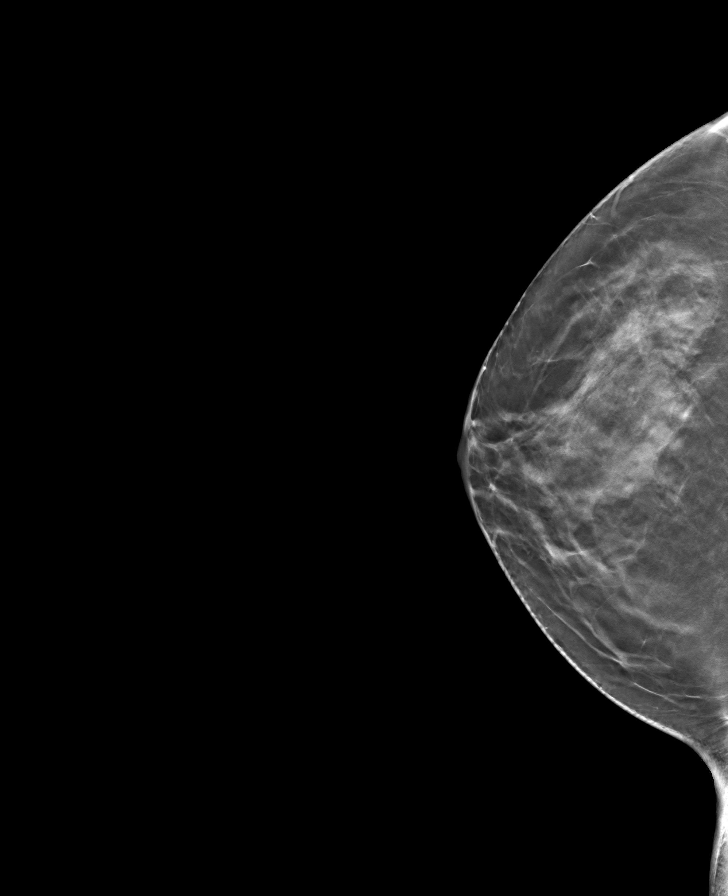

[L CC tomo · tomo slice 36/71.0]
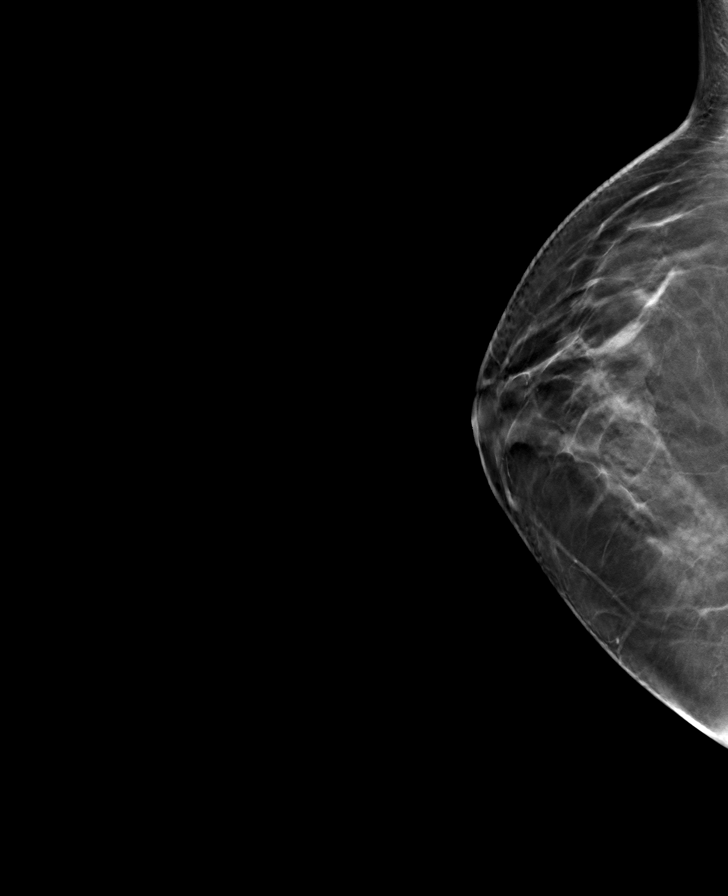

[8 of 24 positions shown; findings below may reference images not displayed]

ACR Breast Density Category b: There are scattered areas of
fibroglandular density.
FINDINGS: There are no findings suspicious for malignancy. Images were
processed with CAD.
IMPRESSION: No mammographic evidence of malignancy. A result letter of this
screening mammogram will be mailed directly to the patient.

RECOMMENDATION:
Screening mammogram in one year. (Code:[id number])

BI-RADS CATEGORY  1: Negative.

## 2019-05-13 DIAGNOSIS — K1120 Sialoadenitis, unspecified: Secondary | ICD-10-CM | POA: Diagnosis not present

## 2019-07-03 DIAGNOSIS — Z9889 Other specified postprocedural states: Secondary | ICD-10-CM | POA: Diagnosis not present

## 2019-07-22 DIAGNOSIS — H5711 Ocular pain, right eye: Secondary | ICD-10-CM | POA: Diagnosis not present

## 2019-08-12 ENCOUNTER — Ambulatory Visit (INDEPENDENT_AMBULATORY_CARE_PROVIDER_SITE_OTHER): Payer: Federal, State, Local not specified - PPO | Admitting: Gastroenterology

## 2019-08-12 ENCOUNTER — Other Ambulatory Visit (INDEPENDENT_AMBULATORY_CARE_PROVIDER_SITE_OTHER): Payer: Federal, State, Local not specified - PPO

## 2019-08-12 ENCOUNTER — Encounter: Payer: Self-pay | Admitting: Gastroenterology

## 2019-08-12 ENCOUNTER — Telehealth: Payer: Self-pay | Admitting: Emergency Medicine

## 2019-08-12 VITALS — BP 124/68 | HR 76 | Temp 97.9°F | Ht 69.0 in | Wt 152.0 lb

## 2019-08-12 DIAGNOSIS — Z7901 Long term (current) use of anticoagulants: Secondary | ICD-10-CM | POA: Diagnosis not present

## 2019-08-12 DIAGNOSIS — R634 Abnormal weight loss: Secondary | ICD-10-CM

## 2019-08-12 DIAGNOSIS — K529 Noninfective gastroenteritis and colitis, unspecified: Secondary | ICD-10-CM

## 2019-08-12 DIAGNOSIS — K219 Gastro-esophageal reflux disease without esophagitis: Secondary | ICD-10-CM | POA: Diagnosis not present

## 2019-08-12 LAB — CBC WITH DIFFERENTIAL/PLATELET
Basophils Absolute: 0.1 10*3/uL (ref 0.0–0.1)
Basophils Relative: 1.2 % (ref 0.0–3.0)
Eosinophils Absolute: 0.2 10*3/uL (ref 0.0–0.7)
Eosinophils Relative: 3.7 % (ref 0.0–5.0)
HCT: 39.2 % (ref 36.0–46.0)
Hemoglobin: 13 g/dL (ref 12.0–15.0)
Lymphocytes Relative: 31.9 % (ref 12.0–46.0)
Lymphs Abs: 2 10*3/uL (ref 0.7–4.0)
MCHC: 33.3 g/dL (ref 30.0–36.0)
MCV: 95.5 fl (ref 78.0–100.0)
Monocytes Absolute: 0.6 10*3/uL (ref 0.1–1.0)
Monocytes Relative: 8.6 % (ref 3.0–12.0)
Neutro Abs: 3.5 10*3/uL (ref 1.4–7.7)
Neutrophils Relative %: 54.6 % (ref 43.0–77.0)
Platelets: 132 10*3/uL — ABNORMAL LOW (ref 150.0–400.0)
RBC: 4.1 Mil/uL (ref 3.87–5.11)
RDW: 13.7 % (ref 11.5–15.5)
WBC: 6.4 10*3/uL (ref 4.0–10.5)

## 2019-08-12 LAB — COMPREHENSIVE METABOLIC PANEL
ALT: 16 U/L (ref 0–35)
AST: 17 U/L (ref 0–37)
Albumin: 3.9 g/dL (ref 3.5–5.2)
Alkaline Phosphatase: 126 U/L — ABNORMAL HIGH (ref 39–117)
BUN: 7 mg/dL (ref 6–23)
CO2: 29 mEq/L (ref 19–32)
Calcium: 9.1 mg/dL (ref 8.4–10.5)
Chloride: 106 mEq/L (ref 96–112)
Creatinine, Ser: 0.67 mg/dL (ref 0.40–1.20)
GFR: 89.91 mL/min (ref >60.00)
Glucose, Bld: 73 mg/dL (ref 70–99)
Potassium: 3.9 mEq/L (ref 3.5–5.1)
Sodium: 141 mEq/L (ref 135–145)
Total Bilirubin: 0.6 mg/dL (ref 0.2–1.2)
Total Protein: 6.9 g/dL (ref 6.0–8.3)

## 2019-08-12 LAB — TSH: TSH: 0.8 u[IU]/mL (ref 0.35–4.50)

## 2019-08-12 MED ORDER — NA SULFATE-K SULFATE-MG SULF 17.5-3.13-1.6 GM/177ML PO SOLN: 1.0000 | ORAL | 0 refills | Status: DC

## 2019-08-12 NOTE — Progress Notes (Signed)
HPI :  58 year old female here for follow-up visit.  She had a history of severe GERD and dysphagia related to peptic strictures.  She was intolerant of all PPIs and her symptoms were poorly controlled with H2 blocker monotherapy.  She had a small hiatal hernia and a positive pH study for pathologic reflux.  She was referred to surgery in early 2019 for a Nissen.  She had this in May 2019 by Dr. Rosendo Gros and she states it went well.  She reports since the surgery she has had no reflux symptoms that bother her.  She is off all antacids and can eat what she wants without trouble.  She denies any dysphasia.  She is been doing very well in that regard.  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  She has a history of pulmonary embolism and DVTs as well.  She has been seen by hematology and had a hypercoagulable work-up.  They have determined that she warrants lifelong anticoagulation for recurrence of clot.  She had a follow-up CT scan in October 2019 which showed chronic occlusion of the portal vein, splenic vein, SMV.  She has been compliant with her Eliquis and takes it routinely.  Her main complaint is frequent bowel movements and diarrhea.  She has had anywhere from 5-7 loose bowel movements per day with urgency, especially after she eats.  This is been ongoing for the past year or so.  She thinks it is getting worse over time, she is lost a considerable amount of weight since her surgery with these ongoing symptoms.  Every time she eats she has a frequent loose stool.  She denies any overt bleeding or blood in her stool.  She has not tried taking anything for her bowels yet.  She has not had blood work in about a year.  She has some intermittent abdominal pains she thinks is cramps related to the diarrhea, sometimes this can be severe.  She has not had a colonoscopy since around 2013 or so, no report of that on file.   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 Gallstones CT scan 03/26/2018 - portal vein occlusion, SMV expanded / congested, diverticulosis  Manometry 01/02/2018 - normal PH study 01/02/19 - Demeester score in 20s  EGD 12/31/2017 - 2cm HH, GEJ stricture dilated to 20m, normal exam otherwise EGD 04/19/2017 - 2cm HH, GEJ stenosis dilated to 141mwith good result, biopsies of stricture benign EGD 08/31/2016 - 2cm hiatal hernia, stricture dilated to 1887mGD 07/26/16 - GEJ stricture dilated to 47m25mD 06/30/16 - GEJ stricture dilated to 13mm76m 01/11/16 - GEJ stricture dilated to 13mm 24m2/13/17 - 7mm GE39mtircture, dilated to 10.5mm, bi44mies benign, no EoE EGD 09/06/15 - 7-8mm GEJ 66micture, dilated with endoscope, biopsies benign Colonoscopy in 2013 or so, she thinks it was normal. No records of this available. No FH of CRC   Past Medical History:  Diagnosis Date  . Allergy    seasonal  . Anticoagulated on warfarin    off since 2015  . Arthritis    ankle- left   . Asthma    no problems in years, rarely uses inhaler  . Clotting disorder (HCC)    [place]  . DVT (deep venous thrombosis) (HCC)    [place] leg in 2014;  no DVT now    . Family history of breast  cancer   . GERD (gastroesophageal reflux disease)   . Heart murmur    pt denies  . Hyperlipidemia   . PE (pulmonary embolism) 04/2014   gone   . Ruptured or perforated eardrum, bilateral   . Uses hearing aid    bilateral  . Wears partial dentures    upper dentures and lower partial     Past Surgical History:  Procedure Laterality Date  . [place] STUDY N/A 01/02/2018   Procedure: [place] STUDY;  Surgeon: [name], MD;  Location: [facility];  Service: Endoscopy;  Laterality: N/A;  . ABDOMINAL HYSTERECTOMY    . APPENDECTOMY    . COLONOSCOPY    . ESOPHAGEAL MANOMETRY N/A 01/02/2018   Procedure: ESOPHAGEAL MANOMETRY (EM);  Surgeon: [name], MD;  Location: [facility];  Service: Endoscopy;  Laterality: N/A;  . ESOPHAGOGASTRODUODENOSCOPY (EGD) WITH ESOPHAGEAL DILATION     x6  . FOOT SURGERY Bilateral    BONE SPURS  . KNEE SURGERY Right 2011   arthroscopy ; in [place]   . OPEN REDUCTION INTERNAL FIXATION (ORIF) DISTAL RADIAL FRACTURE Right 12/02/2014   Procedure: OPEN REDUCTION INTERNAL FIXATION (ORIF) DISTAL RADIAL FRACTURE;  Surgeon: [name], MD;  Location: [facility];  Service: Orthopedics;  Laterality: Right;  . UPPER GASTROINTESTINAL ENDOSCOPY    . WRIST SURGERY     "broke radius bone and theres plates and screws there now ;  Dr Lorin Mercy did"    Family History  Problem Relation Age of Onset  . Heart disease Mother   . Dementia Mother        deceased 47  . Heart disease Father        PACEMAKER  . Cancer Father        penile cancer; currently 31  . Breast cancer Sister 74       currently 17  . Breast cancer Maternal Grandmother 4       deceased 40s  . Diabetes Maternal Grandmother   . Esophageal cancer Maternal Uncle   . Colon cancer Neg Hx   . Colon polyps Neg Hx   . Rectal cancer Neg Hx   . Stomach cancer Neg Hx    Social History   Tobacco Use  . Smoking status: Former Smoker    Packs/day: 20.00    Types: Cigarettes    Quit date: 11/07/1991    Years since quitting: 27.7  . Smokeless tobacco: Never Used  Substance Use Topics  . Alcohol use: No    Alcohol/week: 0.0 standard drinks  . Drug use: No   Current Outpatient Medications  Medication Sig Dispense Refill  . apixaban (ELIQUIS) 5 MG TABS tablet Take 1 tablet (5 mg total) by mouth 2 (two) times daily. 60 tablet 2   No current facility-administered medications for this visit.    Allergies  Allergen Reactions  . Xarelto [Rivaroxaban] Nausea Only  . Chocolate Diarrhea  . Lactose Intolerance (Gi) Diarrhea  . Meat [Alpha-Gal] Nausea Only and Other (See Comments)    And stomach pain  . Pradaxa [Dabigatran Etexilate Mesylate] Nausea Only     Review of  Systems: All systems reviewed and negative except where noted in HPI.   Lab Results  Component Value Date   WBC 8.2 08/06/2018   HGB 13.6 08/06/2018   HCT 41.6 08/06/2018   MCV 92.0 08/06/2018   PLT 180 08/06/2018    Lab Results  Component Value Date   CREATININE 0.75 08/06/2018  BUN <5 (L) 08/06/2018   NA 139 08/06/2018   K 3.3 (L) 08/06/2018   CL 107 08/06/2018   CO2 25 08/06/2018    Lab Results  Component Value Date   ALT 20 08/06/2018   AST 23 08/06/2018   ALKPHOS 124 08/06/2018   BILITOT 0.9 08/06/2018     Physical Exam: BP 124/68 (BP Location: Left Arm, Patient Position: Sitting)   Pulse 76   Temp 97.9 F (36.6 C) (Temporal)   Ht '5\' 9"'  (1.753 m)   Wt 152 lb (68.9 kg)   SpO2 97%   BMI 22.45 kg/m  Constitutional: Pleasant,well-developed, female in no acute distress. HEENT: Normocephalic and atraumatic. Conjunctivae are normal. No scleral icterus. Neck supple.  Cardiovascular: Normal rate, regular rhythm.  Pulmonary/chest: Effort normal and breath sounds normal. No wheezing, rales or rhonchi. Abdominal: Soft, nondistended, nontender.  There are no masses palpable. No hepatomegaly. Extremities: no edema Lymphadenopathy: No cervical adenopathy noted. Neurological: Alert and oriented to person place and time. Skin: Skin is warm and dry. No rashes noted. Psychiatric: Normal mood and affect. Behavior is normal.   ASSESSMENT AND PLAN: 58 year old female here for reassessment of the following:  Chronic diarrhea / weight loss / anticoagulated - more than 1 year worth of frequent loose stools often worse with eating, and associated weight loss and abdominal cramping.  Discussed differential with her. I am recommending basic lab work with CBC, c-Met, TSH today.  Otherwise given her last colonoscopy has been over 7 years ago and her persistent symptoms with weight loss, I am recommending a colonoscopy, will exclude IBD / microscopic colitis.  I discussed risks and  benefits of colonoscopy and anesthesia and she wanted to proceed.  She will need clearance to hold her Eliquis for 2 days prior to the colonoscopy.  We will reach out to her hematologist to see if it is okay to hold the Eliquis for 2 days and if the patient warrants a Lovenox bridge given her history of multiple clots.  Further recommendations pending the results of her colonoscopy.  In the interim until her exam is done, recommend she use Imodium 1 tab every morning and additional tabs as needed to see if this will help control her symptoms.  GERD - now status post Nissen which has controlled her symptoms quite well and she has not had any recurrence, off all medical therapy which is good.  Unfortunately had a very complicated course postoperatively with a portal vein thrombosis and now she is in need of lifelong anticoagulation, followed by Hematology. Hopefully she does not need any further intervention regarding the reflux and continues to do well.  St. Martin Cellar, MD [facility]

## 2019-08-12 NOTE — Patient Instructions (Signed)
Your provider has requested that you go to the basement level for lab work before leaving today. Press "B" on the elevator. The lab is located at the first door on the left as you exit the elevator.  You have been scheduled for a colonoscopy. Please follow written instructions given to you at your visit today.  Please pick up your prep supplies at the pharmacy within the next 1-3 days. If you use inhalers (even only as needed), please bring them with you on the day of your procedure.  You will be contacted by our office prior to your procedure for directions on holding your Eliquis.  If you do not hear from our office 1 week prior to your scheduled procedure, please call [phone] to discuss.

## 2019-08-12 NOTE — Telephone Encounter (Signed)
   Georga Buckwalter September 21, 1961 [id number]  Dear Dr. Delton Coombes:  We have scheduled the above named patient for a colonoscopy procedure. Our records show that (s)he is on anticoagulation therapy.  Please advise as to whether the patient may come off their therapy of Eliquis 2 days prior to their procedure which is scheduled for 08-20-2019.  Please route your response to Tinnie Gens, CMA or fax response to [phone].  Sincerely,    [facility]

## 2019-08-18 ENCOUNTER — Encounter (HOSPITAL_COMMUNITY): Payer: Self-pay | Admitting: *Deleted

## 2019-08-18 NOTE — Telephone Encounter (Signed)
Patient informed and verbalized understanding. She will hold Eliquis until procedure and then resume per discharge instructions.

## 2019-08-18 NOTE — Progress Notes (Signed)
Received a call from [facility] for patient to come off her Eliquis therapy for 2 days prior to colonoscopy.  Okay per Dr. Delton Coombes for patient to hold dosing for 2 days and then resume after procedure.

## 2019-08-19 ENCOUNTER — Telehealth: Payer: Self-pay

## 2019-08-19 NOTE — Telephone Encounter (Signed)
Covid-19 screening questions   Do you now or have you had a fever in the last 14 days?  Do you have any respiratory symptoms of shortness of breath or cough now or in the last 14 days?  Do you have any family members or close contacts with diagnosed or suspected Covid-19 in the past 14 days?  Have you been tested for Covid-19 and found to be positive?       

## 2019-08-20 ENCOUNTER — Other Ambulatory Visit: Payer: Self-pay

## 2019-08-20 ENCOUNTER — Encounter: Payer: Self-pay | Admitting: Gastroenterology

## 2019-08-20 ENCOUNTER — Ambulatory Visit (AMBULATORY_SURGERY_CENTER): Payer: Federal, State, Local not specified - PPO | Admitting: Gastroenterology

## 2019-08-20 VITALS — BP 103/58 | HR 60 | Temp 97.8°F | Resp 11 | Ht 69.0 in | Wt 152.0 lb

## 2019-08-20 DIAGNOSIS — K529 Noninfective gastroenteritis and colitis, unspecified: Secondary | ICD-10-CM | POA: Diagnosis not present

## 2019-08-20 DIAGNOSIS — K5730 Diverticulosis of large intestine without perforation or abscess without bleeding: Secondary | ICD-10-CM

## 2019-08-20 DIAGNOSIS — K56600 Partial intestinal obstruction, unspecified as to cause: Secondary | ICD-10-CM | POA: Diagnosis not present

## 2019-08-20 DIAGNOSIS — K5989 Other specified functional intestinal disorders: Secondary | ICD-10-CM | POA: Diagnosis not present

## 2019-08-20 DIAGNOSIS — R634 Abnormal weight loss: Secondary | ICD-10-CM | POA: Diagnosis not present

## 2019-08-20 MED ORDER — SODIUM CHLORIDE 0.9 % IV SOLN: 500.0000 mL | Freq: Once | INTRAVENOUS | Status: DC

## 2019-08-20 NOTE — Progress Notes (Signed)
Called to room to assist during endoscopic procedure.  Patient ID and intended procedure confirmed with present staff. Received instructions for my participation in the procedure from the performing physician.  

## 2019-08-20 NOTE — Progress Notes (Signed)
Temp check by KA/vital check by CW.  Medical and surgical history reviewed and verified by patient.

## 2019-08-20 NOTE — Patient Instructions (Signed)
Thank you for allowing [place] to care for you today!  Await pathology results by mail, approximately 2 weeks.  Recommendations will be made at that time.  Resume previous diet and medications today.  Return to your normal activities tomorrow.   YOU HAD AN ENDOSCOPIC PROCEDURE TODAY AT [facility]:   Refer to the procedure report that was given to you for any specific questions about what was found during the examination.  If the procedure report does not answer your questions, please call your gastroenterologist to clarify.  If you requested that your care partner not be given the details of your procedure findings, then the procedure report has been included in a sealed envelope for you to review at your convenience later.  YOU SHOULD EXPECT: Some feelings of bloating in the abdomen. Passage of more gas than usual.  Walking can help get rid of the air that was put into your GI tract during the procedure and reduce the bloating. If you had a lower endoscopy (such as a colonoscopy or flexible sigmoidoscopy) you may notice spotting of blood in your stool or on the toilet paper. If you underwent a bowel prep for your procedure, you may not have a normal bowel movement for a few days.  Please Note:  You might notice some irritation and congestion in your nose or some drainage.  This is from the oxygen used during your procedure.  There is no need for concern and it should clear up in a day or so.  SYMPTOMS TO REPORT IMMEDIATELY:   Following lower endoscopy (colonoscopy or flexible sigmoidoscopy):  Excessive amounts of blood in the stool  Significant tenderness or worsening of abdominal pains  Swelling of the abdomen that is new, acute  Fever of 100F or higher    For urgent or emergent issues, a gastroenterologist can be reached at any hour by calling [phone].   DIET:  We do recommend a small meal at first, but then you may proceed to your regular diet.  Drink plenty of  fluids but you should avoid alcoholic beverages for 24 hours.  ACTIVITY:  You should plan to take it easy for the rest of today and you should NOT DRIVE or use heavy machinery until tomorrow (because of the sedation medicines used during the test).    FOLLOW UP: Our staff will call the number listed on your records 48-72 hours following your procedure to check on you and address any questions or concerns that you may have regarding the information given to you following your procedure. If we do not reach you, we will leave a message.  We will attempt to reach you two times.  During this call, we will ask if you have developed any symptoms of COVID 19. If you develop any symptoms (ie: fever, flu-like symptoms, shortness of breath, cough etc.) before then, please call [phone].  If you test positive for Covid 19 in the 2 weeks post procedure, please call and report this information to [place].    If any biopsies were taken you will be contacted by phone or by letter within the next 1-3 weeks.  Please call us at [phone] if you have not heard about the biopsies in 3 weeks.    SIGNATURES/CONFIDENTIALITY: You and/or your care partner have signed paperwork which will be entered into your electronic medical record.  These signatures attest to the fact that that the information above on your After Visit Summary has been reviewed and is  understood.  Full responsibility of the confidentiality of this discharge information lies with you and/or your care-partner. 

## 2019-08-20 NOTE — Op Note (Addendum)
[facility] Patient Name: Whitney Peters Procedure Date: 08/20/2019 3:15 PM MRN: [id number] Endoscopist: Viviann Spare P. Adela Lank , MD Age: 58 Referring MD:  Date of Birth: [birth year] Gender: Female Account #: [id number] Procedure:                Colonoscopy Indications:              Chronic diarrhea, weight loss Medicines:                Monitored Anesthesia Care Procedure:                Pre-Anesthesia Assessment:                           - Prior to the procedure, a History and Physical                            was performed, and patient medications and                            allergies were reviewed. The patient's tolerance of                            previous anesthesia was also reviewed. The risks                            and benefits of the procedure and the sedation                            options and risks were discussed with the patient.                            All questions were answered, and informed consent                            was obtained. Prior Anticoagulants: The patient has                            taken Eliquis (apixaban), last dose was 2 days                            prior to procedure. ASA Grade Assessment: III - A                            patient with severe systemic disease. After                            reviewing the risks and benefits, the patient was                            deemed in satisfactory condition to undergo the                            procedure.  After obtaining informed consent, the colonoscope                            was passed under direct vision. Throughout the                            procedure, the patient's blood pressure, pulse, and                            oxygen saturations were monitored continuously. The                            Colonoscope was introduced through the anus and                            advanced to the the terminal ileum, with              identification of the appendiceal orifice and IC                            valve. The colonoscopy was performed without                            difficulty. The patient tolerated the procedure                            well. The quality of the bowel preparation was                            adequate. The terminal ileum, ileocecal valve,                            appendiceal orifice, and rectum were photographed. Scope In: 3:26:36 PM Scope Out: 4:00:26 PM Scope Withdrawal Time: 0 hours 24 minutes 37 seconds  Total Procedure Duration: 0 hours 33 minutes 50 seconds  Findings:                 The perianal and digital rectal examinations were                            normal.                           The terminal ileum appeared normal.                           Many medium-mouthed diverticula were found in the                            entire colon, worst burden in the sigmoid colon.                           The ileocecal valve was prominent, the inferior  portion prolapsed in and out of the ileum, and                            suspected to be lipomatous. Biopsies were taken                            with a cold forceps for histology to ensure no                            adenomatous change.                           The mucosa at the base of the cecum was slightly                            altered, slightly nodular, without clear borders. I                            think a normal variant but biopsies were taken with                            a cold forceps for histology to ensure no flat                            polyp.                           A benign-appearing, intrinsic moderate stenosis was                            found in the distal sigmoid colon with restricted                            mobility associated with diverticulosis, this took                            some time to traverse with the pediatric                             colonoscope.                           The exam was otherwise without abnormality.                           Biopsies for histology were taken with a cold                            forceps from the right colon, left colon and                            transverse colon for evaluation of microscopic  colitis. Complications:            No immediate complications. Estimated blood loss:                            Minimal. Estimated Blood Loss:     Estimated blood loss was minimal. Impression:               - The examined portion of the ileum was normal.                           - Diverticulosis in the entire examined colon.                           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                            Biopsied.                           - Altered mucosa in the cecum. Biopsied.                           - Stricture in the sigmoid colon related to                            diverticulosis.                           - The examination was otherwise normal. No                            inflammatory changes                           - Biopsies were taken with a cold forceps from the                            right colon, left colon and transverse colon for                            evaluation of microscopic colitis. Recommendation:           - Patient has a contact number available for                            emergencies. The signs and symptoms of potential                            delayed complications were discussed with the                            patient. Return to normal activities tomorrow.                            Written discharge instructions were provided to the  patient.                           - Resume previous diet.                           - Continue present medications.                           - Resume Eliquis tonight                           - Await pathology results. Viviann Spare P.  Armbruster, MD 08/20/2019 4:15:47 PM This report has been signed electronically.

## 2019-08-20 NOTE — Progress Notes (Signed)
To PACU, VSS. Report to [email or web address] 

## 2019-08-22 ENCOUNTER — Telehealth: Payer: Self-pay

## 2019-08-22 NOTE — Telephone Encounter (Signed)
  Follow up Call-  Call back number 08/20/2019 12/31/2017 04/19/2017  Post procedure Call Back phone  # [phone]  Permission to leave phone message Yes Yes Yes  Some recent data might be hidden     Patient questions:  Do you have a fever, pain , or abdominal swelling? No. Pain Score  0 *  Have you tolerated food without any problems? Yes.    Have you been able to return to your normal activities? Yes.    Do you have any questions about your discharge instructions: Diet   Yes.   Medications  Yes.   Follow up visit  Yes.    Do you have questions or concerns about your Care? No.  Actions: * If pain score is 4 or above: No action needed, pain <4.  1. Have you developed a fever since your procedure? no  2.   Have you had an respiratory symptoms (SOB or cough) since your procedure? no  3.   Have you tested positive for COVID 19 since your procedure no  4.   Have you had any family members/close contacts diagnosed with the COVID 19 since your procedure?  no   If yes to any of these questions please route to Joylene John, RN and Alphonsa Gin, Therapist, sports.

## 2019-08-25 DIAGNOSIS — Z01419 Encounter for gynecological examination (general) (routine) without abnormal findings: Secondary | ICD-10-CM | POA: Diagnosis not present

## 2019-08-27 ENCOUNTER — Other Ambulatory Visit: Payer: Self-pay

## 2019-08-27 ENCOUNTER — Telehealth: Payer: Self-pay

## 2019-08-27 DIAGNOSIS — K529 Noninfective gastroenteritis and colitis, unspecified: Secondary | ICD-10-CM

## 2019-08-27 NOTE — Telephone Encounter (Signed)
-----   Message from Yetta Flock, MD sent at 08/27/2019  1:43 PM EDT ----- Sherlynn Stalls can you help relay the following to this patient: - Biopsies from the patient's colon showed no evidence of polyp or precancerous change. -Random biopsies showed no evidence of inflammation or cause for her diarrhea. -I am recommending that she go to the lab for IgA level and a TTG IgA level if you can order both of those and have her go to the lab. -In the interim I am recommending she take Imodium 1 tab every morning and additional dosing as needed to help control her loose stools.  If she is already tried this please let me know we will try something else.  Thanks  Celia repeat colonoscopy in 10 years.  Thanks

## 2019-08-27 NOTE — Telephone Encounter (Signed)
Unable to reach patient by phone(no voice mail) sent message by My Chart to please call us

## 2019-08-28 ENCOUNTER — Other Ambulatory Visit (INDEPENDENT_AMBULATORY_CARE_PROVIDER_SITE_OTHER): Payer: Federal, State, Local not specified - PPO

## 2019-08-28 DIAGNOSIS — K529 Noninfective gastroenteritis and colitis, unspecified: Secondary | ICD-10-CM

## 2019-08-28 LAB — IGA: IgA: 241 mg/dL (ref 68–378)

## 2019-08-29 LAB — TISSUE TRANSGLUTAMINASE ABS,IGG,IGA
(tTG) Ab, IgA: 1 U/mL
(tTG) Ab, IgG: 1 U/mL

## 2019-09-09 ENCOUNTER — Telehealth: Payer: Self-pay

## 2019-09-09 NOTE — Telephone Encounter (Signed)
Patient states that she is calling you back from earlier.

## 2019-09-09 NOTE — Telephone Encounter (Signed)
As long as it is working for her and she is tolerating it, safe to use long term. Main side effect is constipation. Would use lowest dose needed to control symptoms. She can see me in follow up in the office to further discuss if she would like. Thanks

## 2019-09-09 NOTE — Telephone Encounter (Signed)
Called patient to clarify her message, and she is concerned about possible side effects of being on Immodium  AD long term. Please advise

## 2019-09-09 NOTE — Telephone Encounter (Signed)
Returned patient's call, left message to please call back °

## 2019-10-21 DIAGNOSIS — H00015 Hordeolum externum left lower eyelid: Secondary | ICD-10-CM | POA: Diagnosis not present

## 2019-11-28 ENCOUNTER — Other Ambulatory Visit: Payer: Self-pay

## 2019-11-28 ENCOUNTER — Inpatient Hospital Stay (HOSPITAL_COMMUNITY): Payer: Federal, State, Local not specified - PPO | Attending: Hematology

## 2019-11-28 DIAGNOSIS — I81 Portal vein thrombosis: Secondary | ICD-10-CM | POA: Insufficient documentation

## 2019-11-28 LAB — CBC WITH DIFFERENTIAL/PLATELET
Abs Immature Granulocytes: 0 10*3/uL (ref 0.00–0.07)
Basophils Absolute: 0.1 10*3/uL (ref 0.0–0.1)
Basophils Relative: 1 %
Eosinophils Absolute: 0.2 10*3/uL (ref 0.0–0.5)
Eosinophils Relative: 4 %
HCT: 41 % (ref 36.0–46.0)
Hemoglobin: 13.6 g/dL (ref 12.0–15.0)
Immature Granulocytes: 0 %
Lymphocytes Relative: 35 %
Lymphs Abs: 1.6 10*3/uL (ref 0.7–4.0)
MCH: 32.3 pg (ref 26.0–34.0)
MCHC: 33.2 g/dL (ref 30.0–36.0)
MCV: 97.4 fL (ref 80.0–100.0)
Monocytes Absolute: 0.3 10*3/uL (ref 0.1–1.0)
Monocytes Relative: 7 %
Neutro Abs: 2.5 10*3/uL (ref 1.7–7.7)
Neutrophils Relative %: 53 %
Platelets: 134 10*3/uL — ABNORMAL LOW (ref 150–400)
RBC: 4.21 MIL/uL (ref 3.87–5.11)
RDW: 13.1 % (ref 11.5–15.5)
WBC: 4.7 10*3/uL (ref 4.0–10.5)
nRBC: 0 % (ref 0.0–0.2)

## 2019-11-28 LAB — COMPREHENSIVE METABOLIC PANEL
ALT: 20 U/L (ref 0–44)
AST: 21 U/L (ref 15–41)
Albumin: 3.8 g/dL (ref 3.5–5.0)
Alkaline Phosphatase: 112 U/L (ref 38–126)
Anion gap: 9 (ref 5–15)
BUN: 11 mg/dL (ref 6–20)
CO2: 25 mmol/L (ref 22–32)
Calcium: 8.9 mg/dL (ref 8.9–10.3)
Chloride: 104 mmol/L (ref 98–111)
Creatinine, Ser: 0.64 mg/dL (ref 0.44–1.00)
GFR calc Af Amer: >60 mL/min (ref >60)
GFR calc non Af Amer: >60 mL/min (ref >60)
Glucose, Bld: 118 mg/dL — ABNORMAL HIGH (ref 70–99)
Potassium: 3.7 mmol/L (ref 3.5–5.1)
Sodium: 138 mmol/L (ref 135–145)
Total Bilirubin: 0.9 mg/dL (ref 0.3–1.2)
Total Protein: 7 g/dL (ref 6.5–8.1)

## 2019-12-05 ENCOUNTER — Inpatient Hospital Stay (HOSPITAL_BASED_OUTPATIENT_CLINIC_OR_DEPARTMENT_OTHER): Payer: Federal, State, Local not specified - PPO | Admitting: Nurse Practitioner

## 2019-12-05 DIAGNOSIS — I81 Portal vein thrombosis: Secondary | ICD-10-CM | POA: Diagnosis not present

## 2019-12-05 MED ORDER — APIXABAN 5 MG PO TABS: 5.0000 mg | ORAL_TABLET | Freq: Two times a day (BID) | ORAL | 5 refills | Status: DC

## 2019-12-05 NOTE — Assessment & Plan Note (Addendum)
1.  Hypercoagulable state: -Unprovoked pulmonary embolism in August thousand 14, diagnosed in [place], started on Coumadin (could not tolerate Xarelto and Pradaxa due to nausea). -Developed femoral vein DVT on 04/10/2014 while patient on Coumadin, subsequently changed to Eliquis, discontinued in October 2015. -Hypercoagulable work-up on 09/15/2014 while patient off anticoagulation showed normal protein C, protein S, negative lupus anticoagulant, negative beta-2 glycoprotein 1 antibody, negative anticardiolipin 1 antibody, negative factor V Leiden and PT 20210A mutation. -She underwent Nissen fundoplication for acid reflux on 03/06/2018, stayed in the hospital for 2 to 3 days, received Lovenox prophylactic doses. 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  Patient was given Lovenox and transition to Eliquis which she has now taking. -As she has had 2 unprovoked DVTs, I have recommended indefinite anticoagulation. -We have checked Jak 2 and other mutation testing which was negative. -Lower extremely Doppler on 05/22/2018 was negative for blood clots. -Last D-dimer was elevated at 0.6.  She is tolerating Eliquis very well without any bleeding issues. -She reports she is having issues with her left hip.  And reports she may need surgical intervention at some point.  She will contact us when the need arises. -She reports she is having dental surgery on 01/06/2020 where they would be removing 2 teeth.  She was told to stop her Eliquis 3 days prior and resume it back the next day.  She is to call us if she has any problems or any questions.  She is having this done at [facility] oral surgery by Dr. Desiree Hane. -Labs done on 11/28/2019 were all WNL except her platelets were slightly low  at 134. -She will follow-up in 1 year with repeat labs

## 2019-12-05 NOTE — Patient Instructions (Signed)
[facility] Discharge Instructions  Follow up in 1 year with labs    Thank you for choosing [facility] at [facility] to provide your oncology and hematology care.  To afford each patient quality time with our provider, please arrive at least 15 minutes before your scheduled appointment time.   If you have a lab appointment with the Cancer Center please come in thru the Main Entrance and check in at the main information desk.  You need to re-schedule your appointment should you arrive 10 or more minutes late.  We strive to give you quality time with our providers, and arriving late affects you and other patients whose appointments are after yours.  Also, if you no show three or more times for appointments you may be dismissed from the clinic at the providers discretion.     Again, thank you for choosing [facility].  Our hope is that these requests will decrease the amount of time that you wait before being seen by our physicians.       _____________________________________________________________  Should you have questions after your visit to [facility], please contact our office at [phone] between the hours of 8:00 a.m. and 4:30 p.m.  Voicemails left after 4:00 p.m. will not be returned until the following business day.  For prescription refill requests, have your pharmacy contact our office and allow 72 hours.    Due to Covid, you will need to wear a mask upon entering the hospital. If you do not have a mask, a mask will be given to you at the Main Entrance upon arrival. For doctor visits, patients may have 1 support person with them. For treatment visits, patients can not have anyone with them due to social distancing guidelines and our immunocompromised population.      

## 2019-12-05 NOTE — Progress Notes (Signed)
Whitney Peters, [name]   CLINIC:  Medical Oncology/Hematology  PCP:  [name], DO [name] [address] [phone]   REASON FOR VISIT: Follow-up for recurrent blood clots  CURRENT THERAPY: Eliquis 5 mg twice daily lifelong   INTERVAL HISTORY:  [name] 59 y.o. female was called for telephone visit today due to her recurrent blood clots.  Patient reports she has been doing well and taking her Eliquis as prescribed.  Patient reports she has not had any more issues with blood clots and she has not been in the hospital since her last visit.  She denies any bleeding issues.  She denies any bright red bleeding per rectum or melena.  She denies any easy bruising. Denies any nausea, vomiting, or diarrhea. Denies any new pains. Had not noticed any recent bleeding such as epistaxis, hematuria or hematochezia. Denies recent chest pain on exertion, shortness of breath on minimal exertion, pre-syncopal episodes, or palpitations. Denies any numbness or tingling in hands or feet. Denies any recent fevers, infections, or recent hospitalizations. Patient reports appetite at 100% and energy level at 100%.  She is eating well maintain her weight at this time.     REVIEW OF SYSTEMS:  Review of Systems  All other systems reviewed and are negative.    PAST MEDICAL/SURGICAL HISTORY:  Past Medical History:  Diagnosis Date  . Allergy    seasonal  . Anticoagulated on warfarin    off since 2015  . Arthritis    ankle- left   . Asthma    no problems in years, rarely uses inhaler  . Clotting disorder ([place])    DVT,PE  . DVT (deep venous thrombosis) ([place])    left leg in 2014;  no DVT now    . Family history of breast cancer   . GERD (gastroesophageal reflux disease)   . Heart murmur    pt denies  . Hyperlipidemia   . PE (pulmonary embolism) 04/2014   gone   . Ruptured or perforated eardrum, bilateral   . Uses hearing aid     bilateral  . Wears partial dentures    upper dentures and lower partial   Past Surgical History:  Procedure Laterality Date  . [place] STUDY N/A 01/02/2018   Procedure: [place] STUDY;  Surgeon: [name], MD;  Location: [facility];  Service: Endoscopy;  Laterality: N/A;  . ABDOMINAL HYSTERECTOMY    . APPENDECTOMY    . COLONOSCOPY    . ESOPHAGEAL MANOMETRY N/A 01/02/2018   Procedure: ESOPHAGEAL MANOMETRY (EM);  Surgeon: [name], MD;  Location: [facility];  Service: Endoscopy;  Laterality: N/A;  . ESOPHAGOGASTRODUODENOSCOPY (EGD) WITH ESOPHAGEAL DILATION     x6  . FOOT SURGERY Bilateral    BONE SPURS  . KNEE SURGERY Right 2011   arthroscopy ; in [place]   . OPEN REDUCTION INTERNAL FIXATION (ORIF) DISTAL RADIAL FRACTURE Right 12/02/2014   Procedure: OPEN REDUCTION INTERNAL FIXATION (ORIF) DISTAL RADIAL FRACTURE;  Surgeon: [name], MD;  Location: [place];  Service: Orthopedics;  Laterality: Right;  . UPPER GASTROINTESTINAL ENDOSCOPY    . WRIST SURGERY     "broke radius bone and theres plates and screws there now ;  Dr Lorin Mercy did"      SOCIAL HISTORY:  Social History   Socioeconomic History  . Marital status: Married    Spouse name: Not on file  .  Number of children: Not on file  . Years of education: Not on file  . Highest education level: Not on file  Occupational History  . Occupation: Student     Comment: CMA  Tobacco Use  . Smoking status: Former Smoker    Packs/day: 20.00    Types: Cigarettes    Quit date: 11/07/1991    Years since quitting: 28.0  . Smokeless tobacco: Never Used  Substance and Sexual Activity  . Alcohol use: No    Alcohol/week: 0.0 standard drinks  . Drug use: No  . Sexual activity: Yes    Birth control/protection: Surgical, Other-see comments    Comment: Hysterectomy  Other Topics Concern  . Not on file  Social History Narrative  . Not on file   Social Determinants of Health   Financial Resource Strain:    . Difficulty of Paying Living Expenses: Not on file  Food Insecurity:   . Worried About Programme researcher, broadcasting/film/video in the Last Year: Not on file  . Ran Out of Food in the Last Year: Not on file  Transportation Needs:   . Lack of Transportation (Medical): Not on file  . Lack of Transportation (Non-Medical): Not on file  Physical Activity:   . Days of Exercise per Week: Not on file  . Minutes of Exercise per Session: Not on file  Stress:   . Feeling of Stress : Not on file  Social Connections:   . Frequency of Communication with Friends and Family: Not on file  . Frequency of Social Gatherings with Friends and Family: Not on file  . Attends Religious Services: Not on file  . Active Member of Clubs or Organizations: Not on file  . Attends Banker Meetings: Not on file  . Marital Status: Not on file  Intimate Partner Violence:   . Fear of Current or Ex-Partner: Not on file  . Emotionally Abused: Not on file  . Physically Abused: Not on file  . Sexually Abused: Not on file    FAMILY HISTORY:  Family History  Problem Relation Age of Onset  . Heart disease Mother   . Dementia Mother        deceased 36  . Heart disease Father        PACEMAKER  . Cancer Father        penile cancer; currently 59  . Breast cancer Sister 63       currently 46  . Breast cancer Maternal Grandmother 57       deceased 57s  . Diabetes Maternal Grandmother   . Esophageal cancer Maternal Uncle   . Colon cancer Neg Hx   . Colon polyps Neg Hx   . Rectal cancer Neg Hx   . Stomach cancer Neg Hx     CURRENT MEDICATIONS:  Outpatient Encounter Medications as of 12/05/2019  Medication Sig  . apixaban (ELIQUIS) 5 MG TABS tablet Take 1 tablet (5 mg total) by mouth 2 (two) times daily.  . [DISCONTINUED] apixaban (ELIQUIS) 5 MG TABS tablet Take 1 tablet (5 mg total) by mouth 2 (two) times daily.   No facility-administered encounter medications on file as of 12/05/2019.    ALLERGIES:  Allergies   Allergen Reactions  . Xarelto [Rivaroxaban] Nausea Only  . Chocolate Diarrhea  . Lactose Intolerance (Gi) Diarrhea  . Meat [Alpha-Gal] Nausea Only and Other (See Comments)    And stomach pain  . Pradaxa [Dabigatran Etexilate Mesylate] Nausea Only     Vital signs: -Deferred  due to telephone visit  Physical Exam -Deferred due to telephone visit -Patient was alert and oriented over the phone and in no acute distress.  LABORATORY DATA:  I have reviewed the labs as listed.  CBC    Component Value Date/Time   WBC 4.7 11/28/2019 1117   RBC 4.21 11/28/2019 1117   HGB 13.6 11/28/2019 1117   HCT 41.0 11/28/2019 1117   PLT 134 (L) 11/28/2019 1117   MCV 97.4 11/28/2019 1117   MCH 32.3 11/28/2019 1117   MCHC 33.2 11/28/2019 1117   RDW 13.1 11/28/2019 1117   LYMPHSABS 1.6 11/28/2019 1117   MONOABS 0.3 11/28/2019 1117   EOSABS 0.2 11/28/2019 1117   BASOSABS 0.1 11/28/2019 1117   CMP Latest Ref Rng & Units 11/28/2019 08/12/2019 08/06/2018  Glucose 70 - 99 mg/dL 734(L) 73 92  BUN 6 - 20 mg/dL 11 7 <9(F)  Creatinine 0.44 - 1.00 mg/dL 7.90 2.40 9.73  Sodium 135 - 145 mmol/L 138 141 139  Potassium 3.5 - 5.1 mmol/L 3.7 3.9 3.3(L)  Chloride 98 - 111 mmol/L 104 106 107  CO2 22 - 32 mmol/L 25 29 25   Calcium 8.9 - 10.3 mg/dL 8.9 9.1 9.2  Total Protein 6.5 - 8.1 g/dL 7.0 6.9 6.7  Total Bilirubin 0.3 - 1.2 mg/dL 0.9 0.6 0.9  Alkaline Phos 38 - 126 U/L 112 126(H) 124  AST 15 - 41 U/L 21 17 23   ALT 0 - 44 U/L 20 16 20    All questions were answered to patient's stated satisfaction. Encouraged patient to call with any new concerns or questions before his next visit to the cancer center and we can certain see him sooner, if needed.     ASSESSMENT & PLAN:   Portal vein thrombosis 1.  Hypercoagulable state: -Unprovoked pulmonary embolism in August thousand 14, diagnosed in , started on Coumadin (could not tolerate Xarelto and Pradaxa due to nausea). -Developed femoral  vein DVT on 04/10/2014 while patient on Coumadin, subsequently changed to Eliquis, discontinued in October 2015. -Hypercoagulable work-up on 09/15/2014 while patient off anticoagulation showed normal protein C, protein S, negative lupus anticoagulant, negative beta-2 glycoprotein 1 antibody, negative anticardiolipin 1 antibody, negative factor V Leiden and PT 20210A mutation. -She underwent Nissen fundoplication for acid reflux on 03/06/2018, stayed in the hospital for 2 to 3 days, received Lovenox prophylactic doses. 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  Patient was given Lovenox and transition to Eliquis which she has now taking. -As she has had 2 unprovoked DVTs, I have recommended indefinite anticoagulation. -We have checked Jak 2 and other mutation testing which was negative. -Lower extremely Doppler on 05/22/2018 was negative for blood clots. -Last D-dimer was elevated at 0.6.  She is tolerating Eliquis very well without any bleeding issues. -She reports she is having issues with her left hip.  And reports she may need surgical intervention at some point.  She will contact 03/27/2018 when the need arises. -She reports she is having dental surgery on 01/06/2020 where they would be removing 2 teeth.  She was told to stop her Eliquis 3 days prior and resume it back the next day.  She is to call 05/24/2018 if she has any problems or any questions.  She is having this done at [facility] oral surgery by  Dr. Desiree Hane. -Labs done on 11/28/2019 were all WNL except her platelets were slightly low at 134. -She will follow-up in 1 year with repeat labs      Orders placed this encounter:  Orders Placed This Encounter  Procedures  . CBC with Differential/Platelet  . Comprehensive metabolic panel  .  D-dimer, quantitative    I provided 25 minutes of non face-to-face telephone visit time during this encounter, and > 50% was spent counseling as documented under my assessment & plan.  Mathis Bud, FNP-C [facility] [employer] [phone]

## 2020-04-28 DIAGNOSIS — M79672 Pain in left foot: Secondary | ICD-10-CM | POA: Diagnosis not present

## 2020-04-28 DIAGNOSIS — R252 Cramp and spasm: Secondary | ICD-10-CM | POA: Diagnosis not present

## 2020-05-13 ENCOUNTER — Ambulatory Visit (INDEPENDENT_AMBULATORY_CARE_PROVIDER_SITE_OTHER): Payer: Federal, State, Local not specified - PPO | Admitting: Podiatry

## 2020-05-13 ENCOUNTER — Other Ambulatory Visit: Payer: Self-pay

## 2020-05-13 ENCOUNTER — Other Ambulatory Visit: Payer: Self-pay | Admitting: Podiatry

## 2020-05-13 ENCOUNTER — Encounter: Payer: Self-pay | Admitting: Podiatry

## 2020-05-13 ENCOUNTER — Ambulatory Visit (INDEPENDENT_AMBULATORY_CARE_PROVIDER_SITE_OTHER): Payer: Federal, State, Local not specified - PPO

## 2020-05-13 DIAGNOSIS — M778 Other enthesopathies, not elsewhere classified: Secondary | ICD-10-CM | POA: Diagnosis not present

## 2020-05-13 DIAGNOSIS — M19079 Primary osteoarthritis, unspecified ankle and foot: Secondary | ICD-10-CM | POA: Diagnosis not present

## 2020-05-13 NOTE — Progress Notes (Signed)
Subjective:  Patient ID: Whitney Peters, female    DOB: 03-04-1961,  MRN: [id number] HPI Chief Complaint  Patient presents with  . Ankle Pain    Anterior ankle left - aching x 1 month, cramping up into calf, previous surgery for spurs, PCP xrayed-said could be another spur, was unsure, tried voltren gel  . New Patient (Initial Visit)    59 y.o. female presents with the above complaint.   Denies fever chills nausea vomiting muscle aches pains calf pain back pain chest pain shortness of breath.  Is concerned about blood clot since she has had several while on Eliquis.  Past Medical History:  Diagnosis Date  . Allergy    seasonal  . Anticoagulated on warfarin    off since 2015  . Arthritis    ankle- left   . Asthma    no problems in years, rarely uses inhaler  . Clotting disorder ([facility])    DVT,PE  . DVT (deep venous thrombosis) ([facility])    left leg in 2014;  no DVT now    . Family history of breast cancer   . GERD (gastroesophageal reflux disease)   . Heart murmur    pt denies  . Hyperlipidemia   . PE (pulmonary embolism) 04/2014   gone   . Ruptured or perforated eardrum, bilateral   . Uses hearing aid    bilateral  . Wears partial dentures    upper dentures and lower partial   Past Surgical History:  Procedure Laterality Date  . 24 HOUR PH STUDY N/A 01/02/2018   Procedure: 24 HOUR PH STUDY;  Surgeon: Napoleon Form, MD;  Location: [facility];  Service: Endoscopy;  Laterality: N/A;  . ABDOMINAL HYSTERECTOMY    . APPENDECTOMY    . COLONOSCOPY    . ESOPHAGEAL MANOMETRY N/A 01/02/2018   Procedure: ESOPHAGEAL MANOMETRY (EM);  Surgeon: Napoleon Form, MD;  Location: [facility];  Service: Endoscopy;  Laterality: N/A;  . ESOPHAGOGASTRODUODENOSCOPY (EGD) WITH ESOPHAGEAL DILATION     x6  . FOOT SURGERY Bilateral    BONE SPURS  . KNEE SURGERY Right 2011   arthroscopy ; in [place]   . OPEN REDUCTION INTERNAL FIXATION (ORIF) DISTAL RADIAL FRACTURE Right  12/02/2014   Procedure: OPEN REDUCTION INTERNAL FIXATION (ORIF) DISTAL RADIAL FRACTURE;  Surgeon: Eldred Manges, MD;  Location: [facility];  Service: Orthopedics;  Laterality: Right;  . UPPER GASTROINTESTINAL ENDOSCOPY    . WRIST SURGERY     "broke radius bone and theres plates and screws there now ;  Dr Ophelia Charter did"     Current Outpatient Medications:  .  apixaban (ELIQUIS) 5 MG TABS tablet, Take 1 tablet (5 mg total) by mouth 2 (two) times daily., Disp: 90 tablet, Rfl: 5 .  ergocalciferol (VITAMIN D2) 1.25 MG (50000 UT) capsule, Vitamin D2 1,250 mcg (50,000 unit) capsule, Disp: , Rfl:  .  traMADol (ULTRAM) 50 MG tablet, Take 50 mg by mouth every 6 (six) hours as needed., Disp: , Rfl:  .  valACYclovir (VALTREX) 1000 MG tablet, Take 1,000 mg by mouth 2 (two) times daily., Disp: , Rfl:   Allergies  Allergen Reactions  . Xarelto [Rivaroxaban] Nausea Only  . Chocolate Diarrhea  . Lactose Intolerance (Gi) Diarrhea  . Meat [Alpha-Gal] Nausea Only and Other (See Comments)    And stomach pain  . Pradaxa [Dabigatran Etexilate Mesylate] Nausea Only   Review of Systems Objective:  There were no vitals filed for this visit.  General: Well developed,  nourished, in no acute distress, alert and oriented x3   Dermatological: Skin is warm, dry and supple bilateral. Nails x 10 are well maintained; remaining integument appears unremarkable at this time. There are no open sores, no preulcerative lesions, no rash or signs of infection present.  Vascular: Dorsalis Pedis artery and Posterior Tibial artery pedal pulses are 2/4 bilateral with immedate capillary fill time. Pedal hair growth present. No varicosities and no lower extremity edema present bilateral.   Neruologic: Grossly intact via light touch bilateral. Vibratory intact via tuning fork bilateral. Protective threshold with Semmes Wienstein monofilament intact to all pedal sites bilateral. Patellar and Achilles deep tendon reflexes 2+ bilateral. No  Babinski or clonus noted bilateral.   Musculoskeletal: No gross boney pedal deformities bilateral. No pain, crepitus, or limitation noted with foot and ankle range of motion bilateral. Muscular strength 5/5 in all groups tested bilateral.  She has no pain on palpation of the calf or the peroneal tendons.  She does have pain on palpation of the anterior lateral ankle joint.  Radiographs taken do not demonstrate any type of abnormality in this area.  There does appear to be some crepitation on range of motion of the ankle joint in the anterolateral gutter.  There also appears to be some tenderness on palpation of her extensor tendons.  Gait: Unassisted, Nonantalgic.    Radiographs:  Radiographs taken today demonstrate no significant osseous abnormalities.  No acute findings.  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  Plan: Discussed etiology pathology conservative surgical therapies offered her an injection today she declined.  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     Geneieve Duell T. Wineglass, [place]

## 2020-05-14 ENCOUNTER — Telehealth: Payer: Self-pay | Admitting: *Deleted

## 2020-05-14 DIAGNOSIS — M778 Other enthesopathies, not elsewhere classified: Secondary | ICD-10-CM

## 2020-05-14 DIAGNOSIS — M19079 Primary osteoarthritis, unspecified ankle and foot: Secondary | ICD-10-CM

## 2020-05-14 NOTE — Telephone Encounter (Signed)
-----   Message from Kristian Covey, [facility] sent at 05/13/2020  4:10 PM EDT ----- Regarding: MRI MRI left ankle - evaluate ankle joint arthritis with chronic extensor tendonitis left ankle - surgical consideration

## 2020-05-14 NOTE — Telephone Encounter (Signed)
Orders to A. Prevette, CMA for pre-cert and faxed to [facility]. 

## 2020-06-01 NOTE — Telephone Encounter (Signed)
Faxed orders, with PA information to [facility].

## 2020-06-01 NOTE — Telephone Encounter (Signed)
Prior Whitney Peters  [phone]  MRI - CPT 534 [id number] Left ankle MRI  Called to check to see if prior Berkley Harvey is needed and Lilla Shook states  PRIOR AUTHORIZATION IS NOT REQUIRED AS LONG AS ITS IN AN OUTPATIENT FACILITY.

## 2020-06-09 ENCOUNTER — Ambulatory Visit
Admission: RE | Admit: 2020-06-09 | Discharge: 2020-06-09 | Disposition: A | Discharge status: Home / Self Care | Payer: Federal, State, Local not specified - PPO | Source: Ambulatory Visit | Attending: Podiatry | Admitting: Podiatry

## 2020-06-09 DIAGNOSIS — M25472 Effusion, left ankle: Secondary | ICD-10-CM | POA: Diagnosis not present

## 2020-06-09 DIAGNOSIS — R600 Localized edema: Secondary | ICD-10-CM | POA: Diagnosis not present

## 2020-06-10 ENCOUNTER — Encounter: Payer: Self-pay | Admitting: Gastroenterology

## 2020-06-10 ENCOUNTER — Ambulatory Visit (INDEPENDENT_AMBULATORY_CARE_PROVIDER_SITE_OTHER): Payer: Federal, State, Local not specified - PPO | Admitting: Gastroenterology

## 2020-06-10 ENCOUNTER — Other Ambulatory Visit: Payer: Federal, State, Local not specified - PPO

## 2020-06-10 VITALS — BP 128/78 | HR 65 | Ht 69.0 in | Wt 151.0 lb

## 2020-06-10 DIAGNOSIS — K219 Gastro-esophageal reflux disease without esophagitis: Secondary | ICD-10-CM | POA: Diagnosis not present

## 2020-06-10 DIAGNOSIS — K529 Noninfective gastroenteritis and colitis, unspecified: Secondary | ICD-10-CM | POA: Diagnosis not present

## 2020-06-10 DIAGNOSIS — R140 Abdominal distension (gaseous): Secondary | ICD-10-CM

## 2020-06-10 MED ORDER — DICYCLOMINE HCL 10 MG PO CAPS
ORAL_CAPSULE | ORAL | 2 refills | Status: DC
Start: 2020-06-10 — End: 2021-01-25

## 2020-06-10 MED ORDER — RIFAXIMIN 550 MG PO TABS
550.0000 mg | ORAL_TABLET | Freq: Three times a day (TID) | ORAL | 0 refills | Status: AC
Start: 2020-06-10 — End: 2020-06-24

## 2020-06-10 NOTE — Patient Instructions (Addendum)
If you are age 60 or older, your body mass index should be between 23-30. Your Body mass index is 22.3 kg/m. If this is out of the aforementioned range listed, please consider follow up with your Primary Care Provider.  If you are age 54 or younger, your body mass index should be between 19-25. Your Body mass index is 22.3 kg/m. If this is out of the aformentioned range listed, please consider follow up with your Primary Care Provider.   Please go to the lab in the basement of our building to have lab work done as you leave today. Hit "B" for basement when you get on the elevator.  When the doors open the lab is on your left.  We will call you with the results. Thank you.  Due to recent changes in healthcare laws, you may see the results of your imaging and laboratory studies on MyChart before your provider has had a chance to review them.  We understand that in some cases there may be results that are confusing or concerning to you. Not all laboratory results come back in the same time frame and the provider may be waiting for multiple results in order to interpret others.  Please give [place] 48 hours in order for your provider to thoroughly review all the results before contacting the office for clarification of your results.   We are giving you a Low FOD-MAP diet to follow.  We have sent the following medications to your pharmacy for you to pick up at your convenience: Bentyl 10 mg: Take one to two tablets every 8 hours as needed  We have given you samples of the following medication to take: Xifaxan 550 mg: Take three times a day for 14 days  Thank you for entrusting me with your care and for choosing [facility], Dr. Ileene Patrick

## 2020-06-10 NOTE — Progress Notes (Signed)
HPI :  59 year old female here for follow-up visit for altered bowel habits and history of GERD.  Recall that she had a history of severe GERD and dysphagia related to recurrent peptic strictures.  She was previously intolerant of all PPIs and H2 blocker monotherapy was not controlling things.  She had a hiatal hernia and a positive pH study with a negative manometry.  Early 2019 she had a Nissen fundoplication with Dr. Derrell Lollingamirez.  Since that time she states her reflux has been well controlled, she is not had any dysphagia, and doing quite well in that regard.  Recall that her postoperative course was complicated by a portal vein thrombosis.  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  The main issue she complains of today is loose stools with gas and bloating.  She states this has been ongoing since her Nissen fundoplication.  She states she eats okay, does not have any vomiting, has rare nausea.  She has anywhere from 5-6 bowel movements per day, often these are loose and with urgency.  She often has to have a bowel movement after she eats.  She denies any nocturnal symptoms.  She has not had any episodes of incontinence but has come close.  She states her stools float on the water and she is concerned about exocrine pancreatic insufficiency.  She has tried reducing certain things from her diet, using lactose-free milk which has not helped, try to avoid red meat which has not helped.  She has tried using Mylanta, Gas-X, and Imodium for her symptoms.  She states Gas-X does not help with the bloating and abdominal distention.  Imodium surprisingly did not help her diarrhea too much.  She tried some probiotics previously which did not help.  She states the excessive gas and abdominal distention and bloating and associated cramping bothers her.  She does find relief with a bowel movement.  She had a CT scan in 2019 which showed  the portal vein thrombosis but no other problems with her bowel.  She has had negative celiac lab testing with me last October, and colonoscopy performed in October 2020 for the symptoms showed no evidence of microscopic colitis, no polyps.  At the time of her last colonoscopy I had recommended Imodium and follow-up with me as needed, I have not seen her since that time.   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 Gallstones CT scan 03/26/2018 - portal vein occlusion, SMV expanded / congested, diverticulosis  Manometry 01/02/2018 - normal PH study 01/02/19 - Demeester score in 20s  EGD 12/31/2017 - 2cm HH, GEJ stricture dilated to 17mm, normal exam otherwise EGD 04/19/2017 - 2cm HH, GEJ stenosis dilated to 18mm with good result, biopsies of stricture benign EGD 08/31/2016 - 2cm hiatal hernia, stricture dilated to 18mm EGD 07/26/16 - GEJ stricture dilated to 16mm EGD 06/30/16 - GEJ stricture dilated to 13mm EGD 01/11/16 - GEJ stricture dilated to 13mm EGD 12/20/15 - 7mm GEJ stircture, dilated to 10.725mm, biopsies benign, no EoE EGD 09/06/15 - 7-208mm GEJ stricture, dilated with endoscope, biopsies benign Colonoscopy in 2013 or so, she thinks it was normal. No records of this available. No FH of CRC  Colonoscopy 08/20/2019 - The perianal and digital rectal examinations were normal. - The terminal ileum appeared normal. - Many medium-mouthed diverticula were found in the entire colon, worst burden in the sigmoid colon. -  The ileocecal valve was prominent, the inferior portion prolapsed in and out of the ileum, and suspected to be lipomatous. Biopsies were taken with a cold forceps for histology to ensure no adenomatous change. - The mucosa at the base of the cecum was slightly altered, slightly nodular, without clear borders. I think a normal variant but biopsies were taken with a cold forceps for histology to ensure no flat polyp. - A benign-appearing,  intrinsic moderate stenosis was found in the distal sigmoid colon with restricted mobility associated with diverticulosis, this took some time to traverse with the pediatric colonoscope. - The exam was otherwise without abnormality.  1. Surgical [P], colon, cecum - COLONIC MUCOSA WITH FOCAL HYPEREMIA. - NO MICROSCOPIC COLITIS, ACTIVE INFLAMMATION, OR CHRONIC CHANGES. 2. Surgical [P], small bowel, ileocecal valve - ILEOCECAL VALVE MUCOSA WITH HYPEREMIA. - NO ACTIVE INFLAMMATION, CHRONIC CHANGES, OR GRANULOMAS. 3. Surgical [P], random colon - COLONIC MUCOSA WITH NO SIGNIFICANT PATHOLOGIC CHANGES. - NO MICROSCOPIC COLITIS, ACTIVE INFLAMMATION, OR CHRONIC CHANGES.    Past Medical History:  Diagnosis Date  . Allergy    seasonal  . Anticoagulated on warfarin    off since 2015  . Arthritis    ankle- left   . Asthma    no problems in years, rarely uses inhaler  . Clotting disorder (HCC)    DVT,PE  . DVT (deep venous thrombosis) (HCC)    left leg in 2014;  no DVT now    . Family history of breast cancer   . GERD (gastroesophageal reflux disease)   . Heart murmur    pt denies  . Hyperlipidemia   . PE (pulmonary embolism) 04/2014   gone   . Ruptured or perforated eardrum, bilateral   . Uses hearing aid    bilateral  . Wears partial dentures    upper dentures and lower partial     Past Surgical History:  Procedure Laterality Date  . 24 HOUR PH STUDY N/A 01/02/2018   Procedure: 24 HOUR PH STUDY;  Surgeon: Napoleon Form, MD;  Location: [facility];  Service: Endoscopy;  Laterality: N/A;  . ABDOMINAL HYSTERECTOMY    . APPENDECTOMY    . COLONOSCOPY    . ESOPHAGEAL MANOMETRY N/A 01/02/2018   Procedure: ESOPHAGEAL MANOMETRY (EM);  Surgeon: Napoleon Form, MD;  Location: [facility];  Service: Endoscopy;  Laterality: N/A;  . ESOPHAGOGASTRODUODENOSCOPY (EGD) WITH ESOPHAGEAL DILATION     x6  . FOOT SURGERY Bilateral    BONE SPURS  . KNEE SURGERY Right 2011    arthroscopy ; in [place]   . NISSEN FUNDOPLICATION  03/2018  . OPEN REDUCTION INTERNAL FIXATION (ORIF) DISTAL RADIAL FRACTURE Right 12/02/2014   Procedure: OPEN REDUCTION INTERNAL FIXATION (ORIF) DISTAL RADIAL FRACTURE;  Surgeon: Eldred Manges, MD;  Location: [facility];  Service: Orthopedics;  Laterality: Right;  . UPPER GASTROINTESTINAL ENDOSCOPY    . WRIST SURGERY     "broke radius bone and theres plates and screws there now ;  Dr Ophelia Charter did"    Family History  Problem Relation Age of Onset  . Heart disease Mother   . Dementia Mother        deceased 64  . Heart disease Father        PACEMAKER  . Cancer Father        penile cancer; currently 81  . Breast cancer Sister 64       currently 62  . Breast cancer Maternal Grandmother 101  deceased 63s  . Diabetes Maternal Grandmother   . Esophageal cancer Maternal Uncle   . Colon cancer Neg Hx   . Colon polyps Neg Hx   . Rectal cancer Neg Hx   . Stomach cancer Neg Hx    Social History   Tobacco Use  . Smoking status: Former Smoker    Packs/day: 20.00    Types: Cigarettes    Quit date: 11/07/1991    Years since quitting: 28.6  . Smokeless tobacco: Never Used  Vaping Use  . Vaping Use: Never used  Substance Use Topics  . Alcohol use: No    Alcohol/week: 0.0 standard drinks  . Drug use: No   Current Outpatient Medications  Medication Sig Dispense Refill  . apixaban (ELIQUIS) 5 MG TABS tablet Take 1 tablet (5 mg total) by mouth 2 (two) times daily. 90 tablet 5   No current facility-administered medications for this visit.   Allergies  Allergen Reactions  . Xarelto [Rivaroxaban] Nausea Only  . Chocolate Diarrhea  . Lactose Intolerance (Gi) Diarrhea  . Meat [Alpha-Gal] Nausea Only and Other (See Comments)    And stomach pain  . Pradaxa [Dabigatran Etexilate Mesylate] Nausea Only     Review of Systems: All systems reviewed and negative except where noted in HPI.   Lab Results  Component Value Date   WBC 4.7  11/28/2019   HGB 13.6 11/28/2019   HCT 41.0 11/28/2019   MCV 97.4 11/28/2019   PLT 134 (L) 11/28/2019    Lab Results  Component Value Date   CREATININE 0.64 11/28/2019   BUN 11 11/28/2019   NA 138 11/28/2019   K 3.7 11/28/2019   CL 104 11/28/2019   CO2 25 11/28/2019    Lab Results  Component Value Date   ALT 20 11/28/2019   AST 21 11/28/2019   ALKPHOS 112 11/28/2019   BILITOT 0.9 11/28/2019    Lab Results  Component Value Date   TSH 0.80 08/12/2019     Physical Exam: BP 128/78   Pulse 65   Ht 5\' 9"  (1.753 m)   Wt 151 lb (68.5 kg)   BMI 22.30 kg/m  Constitutional: Pleasant,well-developed, female in no acute distress. Abdominal: Soft, nondistended, nontender. There are no masses palpable.  Extremities: no edema Neurological: Alert and oriented to person place and time. Skin: Skin is warm and dry. No rashes noted. Psychiatric: Normal mood and affect. Behavior is normal.   ASSESSMENT AND PLAN: 59 year old female here for reassessment of the following:  Chronic diarrhea / abdominal bloating - patient has now had persistent intermittent loose stools as described above, with significant abdominal bloating and excessive flatulence / gas.  Testing negative for celiac disease, colonoscopy without evidence of microscopic colitis.  She has not done well with Imodium or Gas-X.  Discussed DDx with her, not sure if he is has a functional change in her bowel post surgery / IBS D, while SIBO is possible, pancreatic exocrine insufficiency, etc. We will send stool test for pancreatic elastase to ensure okay.  I counseled her on pathophysiology of abdominal bloating and gas, will try low FODMAP diet and provided her handouts about this.  I will also give her some Bentyl to use as needed for cramping.  Recommending otherwise an empiric trial of rifaximin 550mg  3 times daily for 2 weeks, were able to have a free sample at the office to give her today.  I would like to hear back from her in  a few weeks with an  update on how she is doing.  Hopefully this regimen will help her, if her loose stools persist despite treatment, may consider empiric Colestid or cholestyramine.  GERD - her symptoms have remained well controlled post fundoplication and she is doing much better since the surgery.  She has no dysphagia, that has also resolved.  Ileene Patrick, MD [facility] Gastro[facility]

## 2020-06-11 DIAGNOSIS — R140 Abdominal distension (gaseous): Secondary | ICD-10-CM | POA: Diagnosis not present

## 2020-06-11 DIAGNOSIS — K529 Noninfective gastroenteritis and colitis, unspecified: Secondary | ICD-10-CM | POA: Diagnosis not present

## 2020-06-11 DIAGNOSIS — K219 Gastro-esophageal reflux disease without esophagitis: Secondary | ICD-10-CM | POA: Diagnosis not present

## 2020-06-14 ENCOUNTER — Telehealth: Payer: Self-pay | Admitting: *Deleted

## 2020-06-14 NOTE — Telephone Encounter (Signed)
-----   Message from Elinor Parkinson, [place] sent at 06/11/2020  4:25 PM EDT ----- Please send for an over read and inform the patient of the delay.  Have them specifically look overlying the extensor tendons.

## 2020-06-14 NOTE — Telephone Encounter (Signed)
Left message informing pt Dr. Al Corpus requested to send a copy of the MRI disc to a radiology specialist for more details for treatment planning, there would be a 10 to 14 day delay in final results and once received we would call with information. Faxed request for copy of the MRI disc to [facility].

## 2020-06-15 LAB — PANCREATIC ELASTASE, FECAL: Pancreatic Elastase-1, Stool: >500 mcg/g

## 2020-06-17 NOTE — Telephone Encounter (Signed)
Received copy of MRI disc from [facility] and mailed to [facility]. 

## 2020-06-28 ENCOUNTER — Telehealth: Payer: Self-pay | Admitting: Gastroenterology

## 2020-06-28 NOTE — Telephone Encounter (Signed)
Pt is requesting an appt to see Dr Adela Lank due to some medication not working for her, pt does not want to wait till Oct to be seen.

## 2020-06-28 NOTE — Telephone Encounter (Signed)
Sorry to hear that. Yes, okay to try colestid 1gm BID to start, can increase to 2gm BID if needed. Call back in a few weeks if no better. Thanks

## 2020-06-28 NOTE — Telephone Encounter (Signed)
Patient reports no improvement with the xifaxan.  Do you want to consider colestid or cholestyramine? She reports no improvement. Please advise

## 2020-06-29 MED ORDER — COLESTIPOL HCL 1 G PO TABS: 1.0000 g | ORAL_TABLET | Freq: Two times a day (BID) | ORAL | 3 refills | Status: DC

## 2020-06-29 NOTE — Telephone Encounter (Signed)
Patient notified New rx sent  

## 2020-09-20 ENCOUNTER — Other Ambulatory Visit: Payer: Self-pay | Admitting: Gastroenterology

## 2020-09-24 DIAGNOSIS — H35361 Drusen (degenerative) of macula, right eye: Secondary | ICD-10-CM | POA: Diagnosis not present

## 2020-09-24 DIAGNOSIS — H2513 Age-related nuclear cataract, bilateral: Secondary | ICD-10-CM | POA: Diagnosis not present

## 2020-09-24 DIAGNOSIS — H43811 Vitreous degeneration, right eye: Secondary | ICD-10-CM | POA: Diagnosis not present

## 2020-10-04 DIAGNOSIS — R0781 Pleurodynia: Secondary | ICD-10-CM | POA: Diagnosis not present

## 2020-11-11 ENCOUNTER — Other Ambulatory Visit: Payer: Self-pay

## 2020-11-11 ENCOUNTER — Other Ambulatory Visit (HOSPITAL_COMMUNITY): Payer: Self-pay

## 2020-11-11 DIAGNOSIS — I81 Portal vein thrombosis: Secondary | ICD-10-CM

## 2020-11-11 MED ORDER — COLESTIPOL HCL 1 G PO TABS: 1.0000 g | ORAL_TABLET | Freq: Two times a day (BID) | ORAL | 5 refills | Status: DC

## 2020-11-11 MED ORDER — APIXABAN 5 MG PO TABS: 5.0000 mg | ORAL_TABLET | Freq: Two times a day (BID) | ORAL | 5 refills | Status: DC

## 2020-11-11 NOTE — Progress Notes (Signed)
rec'd faxed request to refill Colesid 1 g: bid from [facility] in [place].  Script sent

## 2020-12-06 ENCOUNTER — Other Ambulatory Visit (HOSPITAL_COMMUNITY): Payer: Self-pay | Admitting: *Deleted

## 2020-12-06 DIAGNOSIS — I81 Portal vein thrombosis: Secondary | ICD-10-CM

## 2020-12-07 ENCOUNTER — Inpatient Hospital Stay (HOSPITAL_COMMUNITY): Payer: Federal, State, Local not specified - PPO | Attending: Hematology

## 2020-12-14 ENCOUNTER — Other Ambulatory Visit: Payer: Self-pay

## 2020-12-14 ENCOUNTER — Inpatient Hospital Stay (HOSPITAL_COMMUNITY): Payer: Federal, State, Local not specified - PPO | Admitting: Hematology

## 2020-12-14 ENCOUNTER — Inpatient Hospital Stay (HOSPITAL_COMMUNITY): Payer: Federal, State, Local not specified - PPO | Attending: Hematology

## 2020-12-14 DIAGNOSIS — Z7901 Long term (current) use of anticoagulants: Secondary | ICD-10-CM | POA: Insufficient documentation

## 2020-12-14 DIAGNOSIS — I81 Portal vein thrombosis: Secondary | ICD-10-CM

## 2020-12-14 DIAGNOSIS — Z86718 Personal history of other venous thrombosis and embolism: Secondary | ICD-10-CM | POA: Insufficient documentation

## 2020-12-14 LAB — CBC WITH DIFFERENTIAL/PLATELET
Abs Immature Granulocytes: 0.01 10*3/uL (ref 0.00–0.07)
Basophils Absolute: 0.1 10*3/uL (ref 0.0–0.1)
Basophils Relative: 1 %
Eosinophils Absolute: 0.2 10*3/uL (ref 0.0–0.5)
Eosinophils Relative: 3 %
HCT: 40.4 % (ref 36.0–46.0)
Hemoglobin: 13.5 g/dL (ref 12.0–15.0)
Immature Granulocytes: 0 %
Lymphocytes Relative: 26 %
Lymphs Abs: 1.8 10*3/uL (ref 0.7–4.0)
MCH: 32.6 pg (ref 26.0–34.0)
MCHC: 33.4 g/dL (ref 30.0–36.0)
MCV: 97.6 fL (ref 80.0–100.0)
Monocytes Absolute: 0.6 10*3/uL (ref 0.1–1.0)
Monocytes Relative: 9 %
Neutro Abs: 4.2 10*3/uL (ref 1.7–7.7)
Neutrophils Relative %: 61 %
Platelets: 141 10*3/uL — ABNORMAL LOW (ref 150–400)
RBC: 4.14 MIL/uL (ref 3.87–5.11)
RDW: 12.6 % (ref 11.5–15.5)
WBC: 6.9 10*3/uL (ref 4.0–10.5)
nRBC: 0 % (ref 0.0–0.2)

## 2020-12-14 LAB — D-DIMER, QUANTITATIVE: D-Dimer, Quant: 0.68 ug/mL-FEU — ABNORMAL HIGH (ref 0.00–0.50)

## 2020-12-24 ENCOUNTER — Ambulatory Visit (HOSPITAL_COMMUNITY): Payer: Federal, State, Local not specified - PPO | Admitting: Hematology and Oncology

## 2021-01-03 ENCOUNTER — Ambulatory Visit (HOSPITAL_COMMUNITY): Payer: Federal, State, Local not specified - PPO | Admitting: Hematology

## 2021-01-18 ENCOUNTER — Ambulatory Visit (HOSPITAL_COMMUNITY): Payer: Federal, State, Local not specified - PPO | Admitting: Oncology

## 2021-01-24 ENCOUNTER — Other Ambulatory Visit (HOSPITAL_COMMUNITY): Payer: Self-pay

## 2021-01-24 DIAGNOSIS — I81 Portal vein thrombosis: Secondary | ICD-10-CM

## 2021-01-25 ENCOUNTER — Inpatient Hospital Stay (HOSPITAL_COMMUNITY): Payer: Federal, State, Local not specified - PPO | Attending: Hematology | Admitting: Oncology

## 2021-01-25 ENCOUNTER — Other Ambulatory Visit (HOSPITAL_COMMUNITY): Payer: Federal, State, Local not specified - PPO

## 2021-01-25 ENCOUNTER — Other Ambulatory Visit: Payer: Self-pay

## 2021-01-25 ENCOUNTER — Inpatient Hospital Stay (HOSPITAL_COMMUNITY): Payer: Federal, State, Local not specified - PPO

## 2021-01-25 VITALS — BP 113/63 | HR 60 | Temp 97.0°F | Resp 18 | Wt 164.2 lb

## 2021-01-25 DIAGNOSIS — Z803 Family history of malignant neoplasm of breast: Secondary | ICD-10-CM | POA: Diagnosis not present

## 2021-01-25 DIAGNOSIS — D696 Thrombocytopenia, unspecified: Secondary | ICD-10-CM | POA: Diagnosis not present

## 2021-01-25 DIAGNOSIS — D6859 Other primary thrombophilia: Secondary | ICD-10-CM | POA: Insufficient documentation

## 2021-01-25 DIAGNOSIS — Z7901 Long term (current) use of anticoagulants: Secondary | ICD-10-CM | POA: Insufficient documentation

## 2021-01-25 DIAGNOSIS — H43812 Vitreous degeneration, left eye: Secondary | ICD-10-CM | POA: Diagnosis not present

## 2021-01-25 DIAGNOSIS — I81 Portal vein thrombosis: Secondary | ICD-10-CM

## 2021-01-25 DIAGNOSIS — Z87891 Personal history of nicotine dependence: Secondary | ICD-10-CM | POA: Insufficient documentation

## 2021-01-25 DIAGNOSIS — Z800 Family history of malignant neoplasm of digestive organs: Secondary | ICD-10-CM | POA: Diagnosis not present

## 2021-01-25 DIAGNOSIS — R252 Cramp and spasm: Secondary | ICD-10-CM | POA: Diagnosis not present

## 2021-01-25 DIAGNOSIS — Z79899 Other long term (current) drug therapy: Secondary | ICD-10-CM | POA: Diagnosis not present

## 2021-01-25 DIAGNOSIS — Z86718 Personal history of other venous thrombosis and embolism: Secondary | ICD-10-CM | POA: Insufficient documentation

## 2021-01-25 LAB — CBC WITH DIFFERENTIAL/PLATELET
Abs Immature Granulocytes: 0 10*3/uL (ref 0.00–0.07)
Basophils Absolute: 0 10*3/uL (ref 0.0–0.1)
Basophils Relative: 1 %
Eosinophils Absolute: 0.2 10*3/uL (ref 0.0–0.5)
Eosinophils Relative: 4 %
HCT: 40.1 % (ref 36.0–46.0)
Hemoglobin: 13.1 g/dL (ref 12.0–15.0)
Immature Granulocytes: 0 %
Lymphocytes Relative: 37 %
Lymphs Abs: 1.6 10*3/uL (ref 0.7–4.0)
MCH: 32.3 pg (ref 26.0–34.0)
MCHC: 32.7 g/dL (ref 30.0–36.0)
MCV: 98.8 fL (ref 80.0–100.0)
Monocytes Absolute: 0.3 10*3/uL (ref 0.1–1.0)
Monocytes Relative: 7 %
Neutro Abs: 2.2 10*3/uL (ref 1.7–7.7)
Neutrophils Relative %: 51 %
Platelets: 101 10*3/uL — ABNORMAL LOW (ref 150–400)
RBC: 4.06 MIL/uL (ref 3.87–5.11)
RDW: 13 % (ref 11.5–15.5)
WBC: 4.2 10*3/uL (ref 4.0–10.5)
nRBC: 0 % (ref 0.0–0.2)

## 2021-01-25 LAB — COMPREHENSIVE METABOLIC PANEL
ALT: 20 U/L (ref 0–44)
AST: 25 U/L (ref 15–41)
Albumin: 3.8 g/dL (ref 3.5–5.0)
Alkaline Phosphatase: 115 U/L (ref 38–126)
Anion gap: 10 (ref 5–15)
BUN: 9 mg/dL (ref 8–23)
CO2: 22 mmol/L (ref 22–32)
Calcium: 8.6 mg/dL — ABNORMAL LOW (ref 8.9–10.3)
Chloride: 105 mmol/L (ref 98–111)
Creatinine, Ser: 0.71 mg/dL (ref 0.44–1.00)
GFR, Estimated: >60 mL/min (ref >60)
Glucose, Bld: 175 mg/dL — ABNORMAL HIGH (ref 70–99)
Potassium: 3.5 mmol/L (ref 3.5–5.1)
Sodium: 137 mmol/L (ref 135–145)
Total Bilirubin: 0.6 mg/dL (ref 0.3–1.2)
Total Protein: 7.3 g/dL (ref 6.5–8.1)

## 2021-01-25 LAB — D-DIMER, QUANTITATIVE: D-Dimer, Quant: 0.62 ug/mL-FEU — ABNORMAL HIGH (ref 0.00–0.50)

## 2021-01-25 NOTE — Progress Notes (Signed)
[facility] [address] , [place] [id number]   CLINIC:  Medical Oncology/Hematology  PCP:  Lenell AntuLe, Thao P, DO [address] [place] [id number]   REASON FOR VISIT: Follow-up for recurrent blood clots  CURRENT THERAPY: Eliquis 5 mg twice daily lifelong   INTERVAL HISTORY:  Whitney Peters 60 y.o. female was called for telephone visit today due to her recurrent blood clots.  She was last seen in clinic on 12/05/2019.  In the interim, she has done well.  She denies any problems with her Eliquis.  Denies any GI bleeding.  Reports bilateral lower extremity cramping especially at bedtime. She denies any easy bruising. Denies any nausea, vomiting, or diarrhea. Denies any new pains. Had not noticed any recent bleeding such as epistaxis, hematuria or hematochezia. Denies recent chest pain on exertion, shortness of breath on minimal exertion, pre-syncopal episodes, or palpitations. Denies any numbness or tingling in hands or feet. Denies any recent fevers, infections, or recent hospitalizations. Patient reports appetite at 100% and energy level at 100%.  She is eating well maintain her weight at this time.   REVIEW OF SYSTEMS:  Review of Systems  Constitutional: Negative for appetite change, fatigue, fever and unexpected weight change.  HENT:   Negative for nosebleeds, sore throat and trouble swallowing.   Eyes: Negative.   Respiratory: Negative.  Negative for cough, shortness of breath and wheezing.   Cardiovascular: Negative.  Negative for chest pain and leg swelling.  Gastrointestinal: Negative for abdominal pain, blood in stool, constipation, diarrhea, nausea and vomiting.  Endocrine: Negative.   Genitourinary: Negative.  Negative for bladder incontinence, hematuria and nocturia.   Musculoskeletal: Positive for arthralgias and myalgias. Negative for back pain and flank pain.       Bilateral lower extremity cramping  Skin: Negative.   Neurological:  Negative.  Negative for dizziness, headaches, light-headedness and numbness.  Hematological: Negative.   Psychiatric/Behavioral: Negative.  Negative for confusion. The patient is not nervous/anxious.   All other systems reviewed and are negative.    PAST MEDICAL/SURGICAL HISTORY:  Past Medical History:  Diagnosis Date  . Allergy    seasonal  . Anticoagulated on warfarin    off since 2015  . Arthritis    ankle- left   . Asthma    no problems in years, rarely uses inhaler  . Clotting disorder (HCC)    DVT,PE  . DVT (deep venous thrombosis) (HCC)    left leg in 2014;  no DVT now    . Family history of breast cancer   . GERD (gastroesophageal reflux disease)   . Heart murmur    pt denies  . Hyperlipidemia   . PE (pulmonary embolism) 04/2014   gone   . Ruptured or perforated eardrum, bilateral   . Uses hearing aid    bilateral  . Wears partial dentures    upper dentures and lower partial   Past Surgical History:  Procedure Laterality Date  . 24 HOUR PH STUDY N/A 01/02/2018   Procedure: 24 HOUR PH STUDY;  Surgeon: Napoleon FormNandigam, Kavitha V, MD;  Location: [facility];  Service: Endoscopy;  Laterality: N/A;  . ABDOMINAL HYSTERECTOMY    . APPENDECTOMY    . COLONOSCOPY    . ESOPHAGEAL MANOMETRY N/A 01/02/2018   Procedure: ESOPHAGEAL MANOMETRY (EM);  Surgeon: Napoleon FormNandigam, Kavitha V, MD;  Location: [facility];  Service: Endoscopy;  Laterality: N/A;  . ESOPHAGOGASTRODUODENOSCOPY (EGD) WITH ESOPHAGEAL DILATION     x6  .  FOOT SURGERY Bilateral    BONE SPURS  . KNEE SURGERY Right 2011   arthroscopy ; in [place]   . NISSEN FUNDOPLICATION  03/2018  . OPEN REDUCTION INTERNAL FIXATION (ORIF) DISTAL RADIAL FRACTURE Right 12/02/2014   Procedure: OPEN REDUCTION INTERNAL FIXATION (ORIF) DISTAL RADIAL FRACTURE;  Surgeon: Eldred Manges, MD;  Location: [facility];  Service: Orthopedics;  Laterality: Right;  . UPPER GASTROINTESTINAL ENDOSCOPY    . WRIST SURGERY     "broke radius bone and theres  plates and screws there now ;  Dr Ophelia Charter did"      SOCIAL HISTORY:  Social History   Socioeconomic History  . Marital status: Married    Spouse name: Not on file  . Number of children: Not on file  . Years of education: Not on file  . Highest education level: Not on file  Occupational History  . Occupation: Student     Comment: CMA  Tobacco Use  . Smoking status: Former Smoker    Packs/day: 20.00    Types: Cigarettes    Quit date: 11/07/1991    Years since quitting: 29.2  . Smokeless tobacco: Never Used  Vaping Use  . Vaping Use: Never used  Substance and Sexual Activity  . Alcohol use: No    Alcohol/week: 0.0 standard drinks  . Drug use: No  . Sexual activity: Yes    Birth control/protection: Surgical, Other-see comments    Comment: Hysterectomy  Other Topics Concern  . Not on file  Social History Narrative  . Not on file   Social Determinants of Health   Financial Resource Strain: Not on file  Food Insecurity: Not on file  Transportation Needs: Not on file  Physical Activity: Not on file  Stress: Not on file  Social Connections: Not on file  Intimate Partner Violence: Not on file    FAMILY HISTORY:  Family History  Problem Relation Age of Onset  . Heart disease Mother   . Dementia Mother        deceased 69  . Heart disease Father        PACEMAKER  . Cancer Father        penile cancer; currently 23  . Breast cancer Sister 31       currently 22  . Breast cancer Maternal Grandmother 50       deceased 5s  . Diabetes Maternal Grandmother   . Esophageal cancer Maternal Uncle   . Colon cancer Neg Hx   . Colon polyps Neg Hx   . Rectal cancer Neg Hx   . Stomach cancer Neg Hx     CURRENT MEDICATIONS:  Outpatient Encounter Medications as of 01/25/2021  Medication Sig  . acyclovir ointment (ZOVIRAX) 5 % Apply 1 application topically as needed.  Marland Kitchen apixaban (ELIQUIS) 5 MG TABS tablet Take 1 tablet (5 mg total) by mouth 2 (two) times daily.  Marland Kitchen apixaban  (ELIQUIS) 5 MG TABS tablet 1 tablet  . colestipol (COLESTID) 1 g tablet Take 1 tablet (1 g total) by mouth 2 (two) times daily.  . traMADol (ULTRAM) 50 MG tablet 1 tablet  . valACYclovir (VALTREX) 1000 MG tablet Take 1,000 mg by mouth as needed.  . [DISCONTINUED] dicyclomine (BENTYL) 10 MG capsule Take one to two tablets every 8 hours as needed  . [DISCONTINUED] erythromycin ophthalmic ointment 1 application into the lower eyelid of affected eye   No facility-administered encounter medications on file as of 01/25/2021.    ALLERGIES:  Allergies  Allergen Reactions  . Rivaroxaban Nausea Only    Other reaction(s): Nausea  . Chocolate Diarrhea  . Dabigatran Etexilate Mesylate     Other reaction(s): Nausea  . Lactose Intolerance (Gi) Diarrhea  . Meat [Alpha-Gal] Nausea Only and Other (See Comments)    And stomach pain  . Other     Other reaction(s): Sick  . Pradaxa [Dabigatran Etexilate Mesylate] Nausea Only     Physical Exam Constitutional:      Appearance: Normal appearance.  HENT:     Head: Normocephalic and atraumatic.  Eyes:     Pupils: Pupils are equal, round, and reactive to light.  Cardiovascular:     Rate and Rhythm: Normal rate and regular rhythm.     Heart sounds: Normal heart sounds. No murmur heard.   Pulmonary:     Effort: Pulmonary effort is normal.     Breath sounds: Normal breath sounds. No wheezing.  Abdominal:     General: Bowel sounds are normal. There is no distension.     Palpations: Abdomen is soft.     Tenderness: There is no abdominal tenderness.  Musculoskeletal:        General: Normal range of motion.     Cervical back: Normal range of motion.  Skin:    General: Skin is warm and dry.     Findings: No rash.  Neurological:     Mental Status: She is alert and oriented to person, place, and time.  Psychiatric:        Judgment: Judgment normal.      LABORATORY DATA:  I have reviewed the labs as listed.  CBC    Component Value Date/Time    WBC 4.2 01/25/2021 1240   RBC 4.06 01/25/2021 1240   HGB 13.1 01/25/2021 1240   HCT 40.1 01/25/2021 1240   PLT 101 (L) 01/25/2021 1240   MCV 98.8 01/25/2021 1240   MCH 32.3 01/25/2021 1240   MCHC 32.7 01/25/2021 1240   RDW 13.0 01/25/2021 1240   LYMPHSABS 1.6 01/25/2021 1240   MONOABS 0.3 01/25/2021 1240   EOSABS 0.2 01/25/2021 1240   BASOSABS 0.0 01/25/2021 1240   CMP Latest Ref Rng & Units 01/25/2021 11/28/2019 08/12/2019  Glucose 70 - 99 mg/dL 308(M) 578(I) 73  BUN 8 - 23 mg/dL 9 11 7   Creatinine 0.44 - 1.00 mg/dL 6.96 2.95  Sodium 135 - 145 mmol/L 137 138 141  Potassium 3.5 - 5.1 mmol/L 3.5 3.7 3.9  Chloride 98 - 111 mmol/L 105 104 106  CO2 22 - 32 mmol/L 22 25 29   Calcium 8.9 - 10.3 mg/dL 2.84) 8.9 9.1  Total Protein 6.5 - 8.1 g/dL 7.3 7.0 6.9  Total Bilirubin 0.3 - 1.2 mg/dL 0.6 0.9 0.6  Alkaline Phos 38 - 126 U/L 115 112 126(H)  AST 15 - 41 U/L 25 21 17   ALT 0 - 44 U/L 20 20 16    All questions were answered to patient's stated satisfaction. Encouraged patient to call with any new concerns or questions before his next visit to the cancer center and we can certain see him sooner, if needed.     ASSESSMENT & PLAN:  1.  Hypercoagulable state: -Unprovoked pulmonary embolism in August thousand 14, diagnosed in 1.3(K, started on Coumadin (could not tolerate Xarelto and Pradaxa due to nausea). -Developed femoral vein DVT on 04/10/2014 while patient on Coumadin, subsequently changed to Eliquis, discontinued in October 2015. -Hypercoagulable work-up on 09/15/2014 while patient off anticoagulation showed normal protein C,  protein S, negative lupus anticoagulant, negative beta-2 glycoprotein 1 antibody, negative anticardiolipin 1 antibody, negative factor V Leiden and PT 20210A mutation. -She underwent Nissen fundoplication for acid reflux on 03/06/2018, stayed in the hospital for 2 to 3 days, received Lovenox prophylactic doses. -May 2019 developed severe  abdominal pain, admitted to the hospital on 03/25/2018, CT scan of the abdomen on 03/26/2018 showed partial portal vein thrombus within the intrahepatic right portal vein, extends to the extrahepatic main portal vein and possibly SMV.  Patient was given Lovenox and transition to Eliquis which she has now taking. -As she has had 2 unprovoked DVTs, I have recommended indefinite anticoagulation. -We have checked Jak 2 and other mutation testing which was negative. -Lower extremely Doppler on 05/22/2018 was negative for blood clots. -Last D-dimer was elevated at 0.62.   -She is tolerating Eliquis very well without any bleeding issues. -Labs done on 01/25/2021 show a normal CBC with differential except for decreased platelet count.  Platelets from today are 101,000.  CMP unremarkable.  2. Bilateral lower extremity cramping: -Unclear etiology. -CMP is unremarkable.  3.  Thrombocytopenia: -Unclear etiology. -Appear to be intermittently low since 08/12/2019 ranging from 101-141.  -No evidence of bleeding. -Labs from 01/25/2021 show a platelet count of 101. -Most recent CT abdomen/pelvis from 08/06/2018 did not reveal any issues with her liver or spleen. -If platelets continue to decrease, may need additional abdominal imaging and labwork.  Disposition: -RTC in 6 months with repeat labs (CBC, CMP and D-dimer) and MD assessment.  No problem-specific Assessment & Plan notes found for this encounter.   Orders placed this encounter:  No orders of the defined types were placed in this encounter.  Greater than 50% was spent in counseling and coordination of care with this patient including but not limited to discussion of the relevant topics above (See A&P) including, but not limited to diagnosis and management of acute and chronic medical conditions.   Durenda Hurt, NP 01/25/2021 2:57 PM  [facility] [phone]

## 2021-02-10 ENCOUNTER — Ambulatory Visit (HOSPITAL_COMMUNITY)
Admission: RE | Admit: 2021-02-10 | Discharge: 2021-02-10 | Disposition: A | Discharge status: Home / Self Care | Payer: Federal, State, Local not specified - PPO | Source: Ambulatory Visit | Attending: Hematology | Admitting: Hematology

## 2021-02-10 ENCOUNTER — Other Ambulatory Visit (HOSPITAL_COMMUNITY): Payer: Self-pay | Admitting: Hematology

## 2021-02-10 ENCOUNTER — Other Ambulatory Visit: Payer: Self-pay

## 2021-02-10 ENCOUNTER — Inpatient Hospital Stay (HOSPITAL_COMMUNITY): Payer: Federal, State, Local not specified - PPO | Attending: Hematology | Admitting: Hematology

## 2021-02-10 VITALS — BP 133/66 | HR 60 | Temp 97.0°F | Resp 18 | Wt 163.8 lb

## 2021-02-10 DIAGNOSIS — I81 Portal vein thrombosis: Secondary | ICD-10-CM

## 2021-02-10 DIAGNOSIS — Z87891 Personal history of nicotine dependence: Secondary | ICD-10-CM | POA: Diagnosis not present

## 2021-02-10 DIAGNOSIS — D6859 Other primary thrombophilia: Secondary | ICD-10-CM | POA: Diagnosis not present

## 2021-02-10 DIAGNOSIS — Z86711 Personal history of pulmonary embolism: Secondary | ICD-10-CM | POA: Diagnosis not present

## 2021-02-10 DIAGNOSIS — M79652 Pain in left thigh: Secondary | ICD-10-CM | POA: Diagnosis not present

## 2021-02-10 DIAGNOSIS — M79605 Pain in left leg: Secondary | ICD-10-CM | POA: Diagnosis not present

## 2021-02-10 DIAGNOSIS — Z7901 Long term (current) use of anticoagulants: Secondary | ICD-10-CM | POA: Diagnosis not present

## 2021-02-10 DIAGNOSIS — Z86718 Personal history of other venous thrombosis and embolism: Secondary | ICD-10-CM | POA: Diagnosis not present

## 2021-02-10 NOTE — Patient Instructions (Signed)
[facility] at [facility] Discharge Instructions  You were seen today by Dr. Ellin Saba. He went over your recent results. Apply a heating pad on your left arm and leg for pain control. You will be scheduled to have an ultrasound of your left leg and an x-ray of your left femur. Dr. Ellin Saba will call you after the results for follow up.   Thank you for choosing [facility] at [facility] to provide your oncology and hematology care.  To afford each patient quality time with our provider, please arrive at least 15 minutes before your scheduled appointment time.   If you have a lab appointment with the Cancer Center please come in thru the Main Entrance and check in at the main information desk  You need to re-schedule your appointment should you arrive 10 or more minutes late.  We strive to give you quality time with our providers, and arriving late affects you and other patients whose appointments are after yours.  Also, if you no show three or more times for appointments you may be dismissed from the clinic at the providers discretion.     Again, thank you for choosing [facility].  Our hope is that these requests will decrease the amount of time that you wait before being seen by our physicians.       _____________________________________________________________  Should you have questions after your visit to [facility], please contact our office at [phone] between the hours of 8:00 a.m. and 4:30 p.m.  Voicemails left after 4:00 p.m. will not be returned until the following business day.  For prescription refill requests, have your pharmacy contact our office and allow 72 hours.    Cancer Center Support Programs:   > Cancer Support Group  2nd Tuesday of the month 1pm-2pm, Journey Room

## 2021-02-10 NOTE — Progress Notes (Signed)
[facility] [address], [place] [address]   CLINIC:  Medical Oncology/Hematology  PCP:  Lenell Antu, DO [address] / [place] [address]  [phone]  REASON FOR VISIT:  Follow-up for portal vein thrombosis  PRIOR THERAPY:  1. Coumadin from 06/2013 to 04/10/2014-developed femoral vein DVT. 2. Eliquis from 04/2014 to 08/2014.  CURRENT THERAPY: Eliquis 5 mg BID  INTERVAL HISTORY:  Ms. Babetta Paterson, a 60 y.o. female, returns for routine follow-up for her portal vein thrombosis. Syanna was last seen by Durenda Hurt, NP, on 01/25/2021.  Today she reports feeling okay. She complains of having a quarter-sized area in her lateral left bicep and her lateral left thigh which hurt and interfere with her sleep for the past 1.5 weeks. The muscle cramping in her left lateral lower leg just above the ankle has resolved. She denies having any preceding trauma, skin rashes, masses or hematomas. She is taking Eliquis BID and she has not missed any doses; she denies nosebleeds, hematochezia or hematuria. She denies having arthritis in her left hip.   REVIEW OF SYSTEMS:  Review of Systems  Constitutional: Positive for fatigue (depleted). Negative for appetite change.  HENT:   Negative for nosebleeds.   Gastrointestinal: Negative for blood in stool.  Genitourinary: Negative for hematuria.   Musculoskeletal: Positive for myalgias (7-9/10 L leg and L thigh pain). Negative for arthralgias.  Skin: Negative for rash.  Psychiatric/Behavioral: Positive for sleep disturbance (d/t pain).  All other systems reviewed and are negative.   PAST MEDICAL/SURGICAL HISTORY:  Past Medical History:  Diagnosis Date  . Allergy    seasonal  . Anticoagulated on warfarin    off since 2015  . Arthritis    ankle- left   . Asthma    no problems in years, rarely uses inhaler  . Clotting disorder (HCC)    DVT,PE  . DVT (deep venous thrombosis) (HCC)    left leg in 2014;  no  DVT now    . Family history of breast cancer   . GERD (gastroesophageal reflux disease)   . Heart murmur    pt denies  . Hyperlipidemia   . PE (pulmonary embolism) 04/2014   gone   . Ruptured or perforated eardrum, bilateral   . Uses hearing aid    bilateral  . Wears partial dentures    upper dentures and lower partial   Past Surgical History:  Procedure Laterality Date  . 24 HOUR PH STUDY N/A 01/02/2018   Procedure: 24 HOUR PH STUDY;  Surgeon: Napoleon Form, MD;  Location: [facility];  Service: Endoscopy;  Laterality: N/A;  . ABDOMINAL HYSTERECTOMY    . APPENDECTOMY    . COLONOSCOPY    . ESOPHAGEAL MANOMETRY N/A 01/02/2018   Procedure: ESOPHAGEAL MANOMETRY (EM);  Surgeon: Napoleon Form, MD;  Location: [facility];  Service: Endoscopy;  Laterality: N/A;  . ESOPHAGOGASTRODUODENOSCOPY (EGD) WITH ESOPHAGEAL DILATION     x6  . FOOT SURGERY Bilateral    BONE SPURS  . KNEE SURGERY Right 2011   arthroscopy ; in [place]   . NISSEN FUNDOPLICATION  03/2018  . OPEN REDUCTION INTERNAL FIXATION (ORIF) DISTAL RADIAL FRACTURE Right 12/02/2014   Procedure: OPEN REDUCTION INTERNAL FIXATION (ORIF) DISTAL RADIAL FRACTURE;  Surgeon: Eldred Manges, MD;  Location: [facility] OR;  Service: Orthopedics;  Laterality: Right;  . UPPER GASTROINTESTINAL ENDOSCOPY    . WRIST SURGERY     "broke radius bone and  theres plates and screws there now ;  Dr Ophelia Charter did"     SOCIAL HISTORY:  Social History   Socioeconomic History  . Marital status: Married    Spouse name: Not on file  . Number of children: Not on file  . Years of education: Not on file  . Highest education level: Not on file  Occupational History  . Occupation: Student     Comment: CMA  Tobacco Use  . Smoking status: Former Smoker    Packs/day: 20.00    Types: Cigarettes    Quit date: 11/07/1991    Years since quitting: 29.2  . Smokeless tobacco: Never Used  Vaping Use  . Vaping Use: Never used  Substance and Sexual Activity   . Alcohol use: No    Alcohol/week: 0.0 standard drinks  . Drug use: No  . Sexual activity: Yes    Birth control/protection: Surgical, Other-see comments    Comment: Hysterectomy  Other Topics Concern  . Not on file  Social History Narrative  . Not on file   Social Determinants of Health   Financial Resource Strain: Not on file  Food Insecurity: Not on file  Transportation Needs: Not on file  Physical Activity: Not on file  Stress: Not on file  Social Connections: Not on file  Intimate Partner Violence: Not on file    FAMILY HISTORY:  Family History  Problem Relation Age of Onset  . Heart disease Mother   . Dementia Mother        deceased 56  . Heart disease Father        PACEMAKER  . Cancer Father        penile cancer; currently 38  . Breast cancer Sister 67       currently 28  . Breast cancer Maternal Grandmother 31       deceased 33s  . Diabetes Maternal Grandmother   . Esophageal cancer Maternal Uncle   . Colon cancer Neg Hx   . Colon polyps Neg Hx   . Rectal cancer Neg Hx   . Stomach cancer Neg Hx     CURRENT MEDICATIONS:  Current Outpatient Medications  Medication Sig Dispense Refill  . acyclovir ointment (ZOVIRAX) 5 % Apply 1 application topically as needed.    Marland Kitchen apixaban (ELIQUIS) 5 MG TABS tablet Take 1 tablet (5 mg total) by mouth 2 (two) times daily. 90 tablet 5  . apixaban (ELIQUIS) 5 MG TABS tablet 1 tablet    . colestipol (COLESTID) 1 g tablet Take 1 tablet (1 g total) by mouth 2 (two) times daily. 60 tablet 5  . traMADol (ULTRAM) 50 MG tablet 1 tablet    . valACYclovir (VALTREX) 1000 MG tablet Take 1,000 mg by mouth as needed.     No current facility-administered medications for this visit.    ALLERGIES:  Allergies  Allergen Reactions  . Rivaroxaban Nausea Only    Other reaction(s): Nausea  . Chocolate Diarrhea  . Dabigatran Etexilate Mesylate     Other reaction(s): Nausea  . Lactose Intolerance (Gi) Diarrhea  . Meat [Alpha-Gal] Nausea  Only and Other (See Comments)    And stomach pain  . Other     Other reaction(s): Sick  . Pradaxa [Dabigatran Etexilate Mesylate] Nausea Only    PHYSICAL EXAM:  Performance status (ECOG): 1 - Symptomatic but completely ambulatory  Vitals:   02/10/21 1140  BP: 133/66  Pulse: 60  Resp: 18  Temp: (!) 97 F (36.1 C)  SpO2: 98%  Wt Readings from Last 3 Encounters:  02/10/21 163 lb 12.8 oz (74.3 kg)  01/25/21 164 lb 3.2 oz (74.5 kg)  06/10/20 151 lb (68.5 kg)   Physical Exam Vitals reviewed.  Constitutional:      Appearance: Normal appearance.  Cardiovascular:     Rate and Rhythm: Normal rate and regular rhythm.     Pulses: Normal pulses.     Heart sounds: Normal heart sounds.  Pulmonary:     Effort: Pulmonary effort is normal.     Breath sounds: Normal breath sounds.  Chest:  Breasts:     Right: No axillary adenopathy or supraclavicular adenopathy.     Left: No axillary adenopathy or supraclavicular adenopathy.    Abdominal:     Palpations: Abdomen is soft. There is no mass.     Tenderness: There is no abdominal tenderness.     Hernia: No hernia is present.  Musculoskeletal:     Right upper arm: No swelling or tenderness.     Left upper arm: No swelling or tenderness.     Right upper leg: No swelling or tenderness.     Left upper leg: Tenderness (TTP) present. No swelling.     Right lower leg: No edema.     Left lower leg: No edema.  Lymphadenopathy:     Upper Body:     Right upper body: No supraclavicular, axillary or pectoral adenopathy.     Left upper body: No supraclavicular, axillary or pectoral adenopathy.     Lower Body: No right inguinal adenopathy. No left inguinal adenopathy.  Skin:    Findings: No abscess or rash.  Neurological:     General: No focal deficit present.     Mental Status: She is alert and oriented to person, place, and time.  Psychiatric:        Mood and Affect: Mood normal.        Behavior: Behavior normal.     LABORATORY DATA:   I have reviewed the labs as listed.  CBC Latest Ref Rng & Units 01/25/2021 12/14/2020 11/28/2019  WBC 4.0 - 10.5 K/uL 4.2 6.9 4.7  Hemoglobin 12.0 - 15.0 g/dL 72.6 20.3 55.9  Hematocrit 36.0 - 46.0 % 40.1 40.4 41.0  Platelets 150 - 400 K/uL 101(L) 141(L) 134(L)   CMP Latest Ref Rng & Units 01/25/2021 11/28/2019 08/12/2019  Glucose 70 - 99 mg/dL 741(U) 384(T) 73  BUN 8 - 23 mg/dL 9 11 7   Creatinine 0.44 - 1.00 mg/dL 3.64 6.80  Sodium 135 - 145 mmol/L 137 138 141  Potassium 3.5 - 5.1 mmol/L 3.5 3.7 3.9  Chloride 98 - 111 mmol/L 105 104 106  CO2 22 - 32 mmol/L 22 25 29   Calcium 8.9 - 10.3 mg/dL 3.21) 8.9 9.1  Total Protein 6.5 - 8.1 g/dL 7.3 7.0 6.9  Total Bilirubin 0.3 - 1.2 mg/dL 0.6 0.9 0.6  Alkaline Phos 38 - 126 U/L 115 112 126(H)  AST 15 - 41 U/L 25 21 17   ALT 0 - 44 U/L 20 20 16       Component Value Date/Time   RBC 4.06 01/25/2021 1240   MCV 98.8 01/25/2021 1240   MCH 32.3 01/25/2021 1240   MCHC 32.7 01/25/2021 1240   RDW 13.0 01/25/2021 1240   LYMPHSABS 1.6 01/25/2021 1240   MONOABS 0.3 01/25/2021 1240   EOSABS 0.2 01/25/2021 1240   BASOSABS 0.0 01/25/2021 1240    DIAGNOSTIC IMAGING:  I have independently reviewed the scans and discussed with the patient.  No results found.   ASSESSMENT:  1.Hypercoagulable state: -Unprovoked pulmonary [facility] [place] August 2014[facility] [place], started on Coumadin (could not tolerate Xarelto and Pradaxa due to nausea). -Developed left femoral vein DVT on 04/10/2014 while patient on Coumadin, subsequently changed to Eliquis, discontinued in October 2015. -Hypercoagulable work-up on 09/15/2014 while patient off anticoagulation showed normal protein C, protein S, negative lupus anticoagulant, negative beta-2 glycoprotein 1 antibody, negative anticardiolipin 1 antibody,negative factor V Leiden and PT 20210Amutation. -She underwent Nissen fundoplication for acid reflux on 03/06/2018, stayed in the hospital for 2 to 3  days, received Lovenox prophylactic doses. 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 Patient was given Lovenox and transition to Eliquis which she has now taking. -As she has had 2 unprovoked DVTs, I have recommended indefinite anticoagulation. -We have checked Jak 2 and other mutation testing which was negative. -Lower extremely Doppler on 05/22/2018 was negative for blood clots. -Last D-dimer was elevated at 0.62.  -She is tolerating Eliquis very well without any bleeding issues. -Labs done on 01/25/2021 show a normal CBC with differential except for decreased platelet count.  Platelets from today are 101,000.  CMP unremarkable.  2.  Bilateral lower extremity cramping: -Unclear etiology. -CMP is unremarkable.  3.  Thrombocytopenia: -Unclear etiology. -Appear to be intermittently low since 08/12/2019 ranging from 101-141.  -No evidence of bleeding. -Labs from 01/25/2021 show a platelet count of 101. -Most recent CT abdomen/pelvis from 08/06/2018 did not reveal any issues with her liver or spleen. -If platelets continue to decrease, may need additional abdominal imaging and labwork.   PLAN:  1. Left thigh pain: -She reported 1 and half week history of left upper thigh pain.  She woke up with it.  Denies any history of trauma. -Physical examination did not reveal any swelling or mass or erythema. -Cramping in her left leg from last visit has gone away. -Because of her history, would recommend Doppler of the left leg. -We will also obtain left femur x-ray. -She also reported a rounded area in the mid lateral arm which is also tender to palpation.  No mass palpable.  We will keep a close eye on it. -Follow-up on the Doppler and x-ray of the left leg.  She has chronically  elevated D-dimer for the last 3 visit.    Orders placed this encounter:  Orders Placed This Encounter  Procedures  . US Venous Img Lower Unilateral Left  . DG FEMUR 1V LEFT     Doreatha MassedSreedhar Glendon Dunwoody, MD [facility] [phone]   I, Drue Secondaniel Khashchuk, am acting as a scribe for Dr. Payton MccallumSreedhar Katagadda.  I, Doreatha MassedSreedhar Kalynne Womac MD, have reviewed the above documentation for accuracy and completeness, and I agree with the above.

## 2021-02-11 ENCOUNTER — Other Ambulatory Visit (HOSPITAL_COMMUNITY): Payer: Self-pay

## 2021-02-11 DIAGNOSIS — I81 Portal vein thrombosis: Secondary | ICD-10-CM

## 2021-05-10 ENCOUNTER — Other Ambulatory Visit (HOSPITAL_COMMUNITY): Payer: Self-pay | Admitting: Surgery

## 2021-05-10 ENCOUNTER — Telehealth (HOSPITAL_COMMUNITY): Payer: Self-pay | Admitting: Surgery

## 2021-05-10 DIAGNOSIS — I81 Portal vein thrombosis: Secondary | ICD-10-CM

## 2021-05-10 MED ORDER — APIXABAN 5 MG PO TABS: 5.0000 mg | ORAL_TABLET | Freq: Two times a day (BID) | ORAL | 5 refills | Status: DC

## 2021-05-10 NOTE — Telephone Encounter (Signed)
The pt had called in stating that she had been out of the state to be with her sister who is terminally ill.  She brought all of her medication with her, and while they were throwing out some of her sister's belongings, her Eliquis medication accidentally got thrown out.  This happened on Saturday, and since then she has been taking Aspirin 81 mg daily.    I notified Dr. Leonides Schanz, who stated that he would send a refill of her Eliquis, and that it was ok for the pt to take her Eliquis BID like normal even though she took an Aspirin this morning.  I called the pt back and she verbalized understanding of these instructions, and I also told her to call our office back if she had any more concerns.

## 2021-05-30 DIAGNOSIS — M25561 Pain in right knee: Secondary | ICD-10-CM | POA: Diagnosis not present

## 2021-06-02 ENCOUNTER — Encounter: Payer: Self-pay | Admitting: Gastroenterology

## 2021-06-02 ENCOUNTER — Ambulatory Visit (INDEPENDENT_AMBULATORY_CARE_PROVIDER_SITE_OTHER): Payer: Federal, State, Local not specified - PPO | Admitting: Gastroenterology

## 2021-06-02 VITALS — BP 156/72 | HR 68 | Ht 69.0 in | Wt 168.8 lb

## 2021-06-02 DIAGNOSIS — R6881 Early satiety: Secondary | ICD-10-CM | POA: Diagnosis not present

## 2021-06-02 DIAGNOSIS — R194 Change in bowel habit: Secondary | ICD-10-CM

## 2021-06-02 DIAGNOSIS — R140 Abdominal distension (gaseous): Secondary | ICD-10-CM

## 2021-06-02 MED ORDER — CITRUCEL PO POWD: ORAL | Status: DC

## 2021-06-02 MED ORDER — METOCLOPRAMIDE HCL 5 MG PO TABS: 5.0000 mg | ORAL_TABLET | Freq: Three times a day (TID) | ORAL | 0 refills | Status: DC

## 2021-06-02 NOTE — Patient Instructions (Addendum)
Please purchase the following medications over the counter and take as directed: Citrucel: as directed once daily  We have sent the following medications to your pharmacy for you to pick up at your convenience: Reglan 5 mg three times daily before meals for ONE month  Please contact us by phone or MyChart message to let us know how your symptoms are in about 3-4 weeks.  If you are age 61 or younger, your body mass index should be between 19-25. Your Body mass index is 24.93 kg/m. If this is out of the aformentioned range listed, please consider follow up with your Primary Care Provider.   __________________________________________________________  The [facility] providers would like to encourage you to use [facility] to communicate with providers for non-urgent requests or questions.  Due to long hold times on the telephone, sending your provider a message by [facility] may be a faster and more efficient way to get a response.  Please allow 48 business hours for a response.  Please remember that this is for non-urgent requests.   Due to recent changes in healthcare laws, you may see the results of your imaging and laboratory studies on MyChart before your provider has had a chance to review them.  We understand that in some cases there may be results that are confusing or concerning to you. Not all laboratory results come back in the same time frame and the provider may be waiting for multiple results in order to interpret others.  Please give [place] 48 hours in order for your provider to thoroughly review all the results before contacting the office for clarification of your results.   It was a pleasure to see you today!  Thank you for trusting me with your gastrointestinal care!

## 2021-06-02 NOTE — Progress Notes (Signed)
HPI :  60 year old female here for follow-up visit for abdominal bloating, early satiety, altered bowel habits.   See prior notes for details of her case. Recall that she had a history of severe GERD and dysphagia related to recurrent peptic strictures.  She was previously intolerant of all PPIs and H2 blocker monotherapy was not controlling things.  She had a hiatal hernia and a positive pH study with a negative manometry.  Early 2019 she had a Nissen fundoplication with Dr. Derrell Lolling.  Since that time she states her reflux has been well controlled, she is not had any dysphagia, and doing quite well in that regard.   Recall that her postoperative course was complicated by a portal vein thrombosis.  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   She presents again for follow-up for ongoing abdominal bloating in her epigastric area, often after eating.  She is also had some early satiety which has been bothering her, she is eating less than she used to due to symptoms.  However, she does deny weight loss, in fact has been gaining some weight.  She lost a lot of weight postoperatively and states she is coming back to her normal weight.  Again her reflux is controlled and she does not have dysphagia.  She does not have any nausea or vomiting.  She is not able to belch post surgery.  She has tried Mylanta and Gas-X in the past without benefit.  She previously had multiple loose stools per day and I gave her an empiric trial of rifaximin but she states that did not help at all.  She was given a trial of Bentyl and cannot recall if that helps.  She had a colonoscopy in 2020, she had no evidence of microscopic colitis.  She did have diverticulosis with some luminal narrowing in the left colon.  She has also had a trial of Colestid given she has some postprandial bowel movements but states that did not help much either.  In regards to  her bowel she states she has more sense of incomplete evacuation at this point.  She has a few bowel movements per day but they are not always loose anymore, can be formed.  No constipation.  No blood in her stools.  She denies other abdominal pains otherwise.  She remains on her anticoagulation.  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 Gallstones CT scan 03/26/2018 - portal vein occlusion, SMV expanded / congested, diverticulosis   Manometry 01/02/2018 - normal PH study 01/02/19 - Demeester score in 20s   EGD 12/31/2017 - 2cm HH, GEJ stricture dilated to 44mm, normal exam otherwise EGD 04/19/2017 - 2cm HH, GEJ stenosis dilated to 84mm with good result, biopsies of stricture benign EGD 08/31/2016 - 2cm hiatal hernia, stricture dilated to 30mm EGD 07/26/16 - GEJ stricture dilated to 61mm EGD 06/30/16 - GEJ stricture dilated to 33mm EGD 01/11/16 - GEJ stricture dilated to 62mm EGD 12/20/15 - 58mm GEJ stircture, dilated to 10.59mm, biopsies benign, no EoE EGD 09/06/15 - 7-66mm GEJ stricture, dilated with endoscope, biopsies benign Colonoscopy in 2013 or so, she thinks it was normal. No records of this available. No FH of CRC   Colonoscopy 08/20/2019 - The perianal and digital rectal examinations were normal. - The terminal ileum appeared normal. - Many medium-mouthed diverticula were  found in the entire colon, worst burden in the sigmoid colon. - The ileocecal valve was prominent, the inferior portion prolapsed in and out of the ileum, and suspected to be lipomatous. Biopsies were taken with a cold forceps for histology to ensure no adenomatous change. - The mucosa at the base of the cecum was slightly altered, slightly nodular, without clear borders. I think a normal variant but biopsies were taken with a cold forceps for histology to ensure no flat polyp. - A benign-appearing, intrinsic moderate stenosis was found  in the distal sigmoid colon with restricted mobility associated with diverticulosis, this took some time to traverse with the pediatric colonoscope. - The exam was otherwise without abnormality.   1. Surgical [P], colon, cecum - COLONIC MUCOSA WITH FOCAL HYPEREMIA. - NO MICROSCOPIC COLITIS, ACTIVE INFLAMMATION, OR CHRONIC CHANGES. 2. Surgical [P], small bowel, ileocecal valve - ILEOCECAL VALVE MUCOSA WITH HYPEREMIA. - NO ACTIVE INFLAMMATION, CHRONIC CHANGES, OR GRANULOMAS. 3. Surgical [P], random colon - COLONIC MUCOSA WITH NO SIGNIFICANT PATHOLOGIC CHANGES. - NO MICROSCOPIC COLITIS, ACTIVE INFLAMMATION, OR CHRONIC CHANGES.     Past Medical History:  Diagnosis Date   Allergy    seasonal   Anticoagulated on warfarin    off since 2015   Arthritis    ankle- left    Asthma    no problems in years, rarely uses inhaler   Clotting disorder (HCC)    DVT,PE   DVT (deep venous thrombosis) (HCC)    left leg in 2014;  no DVT now     Family history of breast cancer    GERD (gastroesophageal reflux disease)    Heart murmur    pt denies   Hyperlipidemia    PE (pulmonary embolism) 04/2014   gone    Ruptured or perforated eardrum, bilateral    Uses hearing aid    bilateral   Wears partial dentures    upper dentures and lower partial     Past Surgical History:  Procedure Laterality Date   57 HOUR PH STUDY N/A 01/02/2018   Procedure: 24 HOUR PH STUDY;  Surgeon: Napoleon Form, MD;  Location: [facility];  Service: Endoscopy;  Laterality: N/A;   ABDOMINAL HYSTERECTOMY     APPENDECTOMY     COLONOSCOPY     ESOPHAGEAL MANOMETRY N/A 01/02/2018   Procedure: ESOPHAGEAL MANOMETRY (EM);  Surgeon: Napoleon Form, MD;  Location: [facility];  Service: Endoscopy;  Laterality: N/A;   ESOPHAGOGASTRODUODENOSCOPY (EGD) WITH ESOPHAGEAL DILATION     x6   FOOT SURGERY Bilateral    BONE SPURS   KNEE SURGERY Right 2011   arthroscopy ; in [place]    NISSEN FUNDOPLICATION  03/2018    OPEN REDUCTION INTERNAL FIXATION (ORIF) DISTAL RADIAL FRACTURE Right 12/02/2014   Procedure: OPEN REDUCTION INTERNAL FIXATION (ORIF) DISTAL RADIAL FRACTURE;  Surgeon: Eldred Manges, MD;  Location: [facility];  Service: Orthopedics;  Laterality: Right;   UPPER GASTROINTESTINAL ENDOSCOPY     WRIST SURGERY     "broke radius bone and theres plates and screws there now ;  Dr Ophelia Charter did"    Family History  Problem Relation Age of Onset   Heart disease Mother    Dementia Mother        deceased 4   Heart disease Father        PACEMAKER   Cancer Father        penile cancer; currently 59   Breast cancer Sister 28  currently 63   Lung cancer Sister    Kidney failure Brother    Breast cancer Maternal Grandmother 88       deceased 37s   Diabetes Maternal Grandmother    Esophageal cancer Maternal Uncle    Colon cancer Neg Hx    Colon polyps Neg Hx    Rectal cancer Neg Hx    Stomach cancer Neg Hx    Social History   Tobacco Use   Smoking status: Former    Packs/day: 20.00    Types: Cigarettes    Quit date: 11/07/1991    Years since quitting: 29.5   Smokeless tobacco: Never  Vaping Use   Vaping Use: Never used  Substance Use Topics   Alcohol use: No    Alcohol/week: 0.0 standard drinks   Drug use: No   Current Outpatient Medications  Medication Sig Dispense Refill   acyclovir ointment (ZOVIRAX) 5 % Apply 1 application topically as needed.     apixaban (ELIQUIS) 5 MG TABS tablet Take 1 tablet (5 mg total) by mouth 2 (two) times daily. 90 tablet 5   traMADol (ULTRAM) 50 MG tablet 1 tablet     valACYclovir (VALTREX) 1000 MG tablet Take 1,000 mg by mouth as needed.     No current facility-administered medications for this visit.   Allergies  Allergen Reactions   Rivaroxaban Nausea Only    Other reaction(s): Nausea   Chocolate Diarrhea   Dabigatran Etexilate Mesylate     Other reaction(s): Nausea   Lactose Intolerance (Gi) Diarrhea   Meat [Alpha-Gal] Nausea Only and Other (See  Comments)    And stomach pain   Other     Other reaction(s): Sick   Pradaxa [Dabigatran Etexilate Mesylate] Nausea Only     Review of Systems: All systems reviewed and negative except where noted in HPI.   Lab Results  Component Value Date   WBC 4.2 01/25/2021   HGB 13.1 01/25/2021   HCT 40.1 01/25/2021   MCV 98.8 01/25/2021   PLT 101 (L) 01/25/2021   Lab Results  Component Value Date   CREATININE 0.71 01/25/2021   BUN 9 01/25/2021   NA 137 01/25/2021   K 3.5 01/25/2021   CL 105 01/25/2021   CO2 22 01/25/2021    Lab Results  Component Value Date   ALT 20 01/25/2021   AST 25 01/25/2021   ALKPHOS 115 01/25/2021   BILITOT 0.6 01/25/2021      Physical Exam: BP (!) 156/72   Pulse 68   Ht 5\' 9"  (1.753 m)   Wt 168 lb 12.8 oz (76.6 kg)   BMI 24.93 kg/m  Constitutional: Pleasant,well-developed, female in no acute distress. Abdominal: Soft, nondistended, nontender. There are no masses palpable.  Extremities: no edema Lymphadenopathy: No cervical adenopathy noted. Neurological: Alert and oriented to person place and time. Skin: Skin is warm and dry. No rashes noted. Psychiatric: Normal mood and affect. Behavior is normal.   ASSESSMENT AND PLAN: 60 year old female here for reassessment of the following:  Early satiety Bloating Altered bowel habits  Symptoms a bit different than when she presented previously, now having early satiety and seems like mostly postprandial epigastric bloating.  Failed simethicone and other regimsn as above. This seems to have started after her fundoplication, probably functional change after the surgery / gas bloat syndrome, she has not had formal testing for gastroparesis yet either.  Discussed options with her, we could consider gastric emptying study versus empiric treatment with Reglan.  I discussed  Reglan and risks of the medication to include tardive dyskinesia at high dosage.  I offered her empiric trial of low-dose Reglan 5 mg 3  times daily to start to see if that helps, she would prefer to start with that rather than additional testing right now.  If her symptoms persist we may consider gastric emptying study or repeat endoscopy as she has not had one after her surgery.  Otherwise she is no longer having persistent loose stools, more so sense of incomplete evacuation and stools can become loose and then formed.  I think she would benefit from fiber supplementation however cautioned her this could make her bloating worse.  She will try Reglan first and see if it helps and if so can start some Citrucel which would be much less likely to cause bloating and other fiber supplements.  I asked her to touch base with me in 3 to 4 weeks for reassessment and update me on her status.   Plan: - empiric trial of low dose Reglan. Consider GES or repeat EGD if persistent symptoms - trial of Citrucel to normalize bowel habits if gas bloat improves with Reglan - she will touch in 3-4 week to let me know how she is doing  Harlin RainSteve Leiby Pigeon, MD [facility]

## 2021-06-08 DIAGNOSIS — M25561 Pain in right knee: Secondary | ICD-10-CM | POA: Diagnosis not present

## 2021-08-10 ENCOUNTER — Inpatient Hospital Stay (HOSPITAL_COMMUNITY): Payer: Federal, State, Local not specified - PPO

## 2021-08-11 ENCOUNTER — Other Ambulatory Visit: Payer: Self-pay

## 2021-08-11 ENCOUNTER — Inpatient Hospital Stay (HOSPITAL_COMMUNITY): Payer: Federal, State, Local not specified - PPO | Attending: Hematology

## 2021-08-11 DIAGNOSIS — I81 Portal vein thrombosis: Secondary | ICD-10-CM | POA: Diagnosis not present

## 2021-08-11 DIAGNOSIS — M25552 Pain in left hip: Secondary | ICD-10-CM | POA: Diagnosis not present

## 2021-08-11 DIAGNOSIS — Z7901 Long term (current) use of anticoagulants: Secondary | ICD-10-CM | POA: Diagnosis not present

## 2021-08-11 DIAGNOSIS — Z90710 Acquired absence of both cervix and uterus: Secondary | ICD-10-CM | POA: Diagnosis not present

## 2021-08-11 DIAGNOSIS — Z86718 Personal history of other venous thrombosis and embolism: Secondary | ICD-10-CM | POA: Insufficient documentation

## 2021-08-11 DIAGNOSIS — Z86711 Personal history of pulmonary embolism: Secondary | ICD-10-CM | POA: Diagnosis not present

## 2021-08-11 DIAGNOSIS — D696 Thrombocytopenia, unspecified: Secondary | ICD-10-CM | POA: Insufficient documentation

## 2021-08-11 DIAGNOSIS — D6859 Other primary thrombophilia: Secondary | ICD-10-CM | POA: Diagnosis not present

## 2021-08-11 DIAGNOSIS — Z87891 Personal history of nicotine dependence: Secondary | ICD-10-CM | POA: Diagnosis not present

## 2021-08-11 DIAGNOSIS — Z803 Family history of malignant neoplasm of breast: Secondary | ICD-10-CM | POA: Diagnosis not present

## 2021-08-11 LAB — CBC WITH DIFFERENTIAL/PLATELET
Abs Immature Granulocytes: 0.01 10*3/uL (ref 0.00–0.07)
Basophils Absolute: 0.1 10*3/uL (ref 0.0–0.1)
Basophils Relative: 1 %
Eosinophils Absolute: 0.2 10*3/uL (ref 0.0–0.5)
Eosinophils Relative: 4 %
HCT: 39.1 % (ref 36.0–46.0)
Hemoglobin: 12.8 g/dL (ref 12.0–15.0)
Immature Granulocytes: 0 %
Lymphocytes Relative: 31 %
Lymphs Abs: 1.5 10*3/uL (ref 0.7–4.0)
MCH: 32.5 pg (ref 26.0–34.0)
MCHC: 32.7 g/dL (ref 30.0–36.0)
MCV: 99.2 fL (ref 80.0–100.0)
Monocytes Absolute: 0.4 10*3/uL (ref 0.1–1.0)
Monocytes Relative: 7 %
Neutro Abs: 2.8 10*3/uL (ref 1.7–7.7)
Neutrophils Relative %: 57 %
Platelets: 106 10*3/uL — ABNORMAL LOW (ref 150–400)
RBC: 3.94 MIL/uL (ref 3.87–5.11)
RDW: 13.3 % (ref 11.5–15.5)
WBC: 4.8 10*3/uL (ref 4.0–10.5)
nRBC: 0 % (ref 0.0–0.2)

## 2021-08-11 LAB — COMPREHENSIVE METABOLIC PANEL
ALT: 21 U/L (ref 0–44)
AST: 21 U/L (ref 15–41)
Albumin: 3.8 g/dL (ref 3.5–5.0)
Alkaline Phosphatase: 134 U/L — ABNORMAL HIGH (ref 38–126)
Anion gap: 6 (ref 5–15)
BUN: 12 mg/dL (ref 8–23)
CO2: 28 mmol/L (ref 22–32)
Calcium: 9 mg/dL (ref 8.9–10.3)
Chloride: 106 mmol/L (ref 98–111)
Creatinine, Ser: 0.68 mg/dL (ref 0.44–1.00)
GFR, Estimated: >60 mL/min (ref >60)
Glucose, Bld: 92 mg/dL (ref 70–99)
Potassium: 4.1 mmol/L (ref 3.5–5.1)
Sodium: 140 mmol/L (ref 135–145)
Total Bilirubin: 0.7 mg/dL (ref 0.3–1.2)
Total Protein: 7 g/dL (ref 6.5–8.1)

## 2021-08-16 NOTE — Progress Notes (Signed)
[facility] [address], [place] [address]   CLINIC:  Medical Oncology/Hematology  PCP:  Lenell Antu, DO [address] / [place] [address]  [phone]  REASON FOR VISIT:  Follow-up for portal vein thrombosis  PRIOR THERAPY:  1. Coumadin from 06/2013 to 04/10/2014-developed femoral vein DVT. 2. Eliquis from 04/2014 to 08/2014.  CURRENT THERAPY: Eliquis 5 mg BID  INTERVAL HISTORY:  [name], a 60 y.o. female, returns for routine follow-up for her portal vein thrombosis. Iyania was last seen on 02/10/2021.  Today she reports feeling good. She reports difficulty falling and staying asleep. She reports continued constant pain in her left hip and leg. She has been taking Eliquis BID. She denies hot flashes, abdominal pain, black stools, current bleeding, and easy bruising. She denies history of miscarriages. She denies family history of Lupus. She reports hair thinning.  REVIEW OF SYSTEMS:  Review of Systems  Constitutional:  Negative for appetite change and fatigue (75%).  HENT:   Negative for nosebleeds.   Respiratory:  Negative for hemoptysis.   Gastrointestinal:  Negative for abdominal pain and blood in stool.  Endocrine: Negative for hot flashes.  Genitourinary:  Negative for hematuria and vaginal bleeding.   Musculoskeletal:  Positive for arthralgias (4/10 L hip and leg).  Hematological:  Does not bruise/bleed easily.  Psychiatric/Behavioral:  Positive for depression (stress) and sleep disturbance.   All other systems reviewed and are negative.  PAST MEDICAL/SURGICAL HISTORY:  Past Medical History:  Diagnosis Date   Allergy    seasonal   Anticoagulated on warfarin    off since 2015   Arthritis    ankle- left    Asthma    no problems in years, rarely uses inhaler   Clotting disorder (HCC)    DVT,PE   DVT (deep venous thrombosis) (HCC)    left leg in 2014;  no DVT now     Family history of breast cancer    GERD  (gastroesophageal reflux disease)    Heart murmur    pt denies   Hyperlipidemia    PE (pulmonary embolism) 04/2014   gone    Ruptured or perforated eardrum, bilateral    Uses hearing aid    bilateral   Wears partial dentures    upper dentures and lower partial   Past Surgical History:  Procedure Laterality Date   82 HOUR PH STUDY N/A 01/02/2018   Procedure: 24 HOUR PH STUDY;  Surgeon: Napoleon Form, MD;  [facility];  Service: Endoscopy;  Laterality: N/A;   ABDOMINAL HYSTERECTOMY     APPENDECTOMY     COLONOSCOPY     ESOPHAGEAL MANOMETRY N/A 01/02/2018   Procedure: ESOPHAGEAL MANOMETRY (EM);  Surgeon: Napoleon Form, MD;  [facility];  Service: Endoscopy;  Laterality: N/A;   ESOPHAGOGASTRODUODENOSCOPY (EGD) WITH ESOPHAGEAL DILATION     x6   FOOT SURGERY Bilateral    BONE SPURS   KNEE SURGERY Right 2011   arthroscopy ; in [place]    NISSEN FUNDOPLICATION  03/2018   OPEN REDUCTION INTERNAL FIXATION (ORIF) DISTAL RADIAL FRACTURE Right 12/02/2014   Procedure: OPEN REDUCTION INTERNAL FIXATION (ORIF) DISTAL RADIAL FRACTURE;  Surgeon: Eldred Manges, MD;  Location: [facility];  Service: Orthopedics;  Laterality: Right;   UPPER GASTROINTESTINAL ENDOSCOPY     WRIST SURGERY     "broke radius bone and theres plates and screws there now ;  Dr Ophelia Charter did"  SOCIAL HISTORY:  Social History   Socioeconomic History   Marital status: Married    Spouse name: Not on file   Number of children: Not on file   Years of education: Not on file   Highest education level: Not on file  Occupational History   Occupation: Student     Comment: CMA  Tobacco Use   Smoking status: Former    Packs/day: 20.00    Types: Cigarettes    Quit date: 11/07/1991    Years since quitting: 29.7   Smokeless tobacco: Never  Vaping Use   Vaping Use: Never used  Substance and Sexual Activity   Alcohol use: No    Alcohol/week: 0.0 standard drinks   Drug use: No   Sexual activity:  Yes    Birth control/protection: Surgical, Other-see comments    Comment: Hysterectomy  Other Topics Concern   Not on file  Social History Narrative   Not on file   Social Determinants of Health   Financial Resource Strain: Not on file  Food Insecurity: Not on file  Transportation Needs: Not on file  Physical Activity: Not on file  Stress: Not on file  Social Connections: Not on file  Intimate Partner Violence: Not on file    FAMILY HISTORY:  Family History  Problem Relation Age of Onset   Heart disease Mother    Dementia Mother        deceased 25   Heart disease Father        PACEMAKER   Cancer Father        penile cancer; currently 45   Breast cancer Sister 18       currently 35   Lung cancer Sister    Kidney failure Brother    Breast cancer Maternal Grandmother 78       deceased 56s   Diabetes Maternal Grandmother    Esophageal cancer Maternal Uncle    Colon cancer Neg Hx    Colon polyps Neg Hx    Rectal cancer Neg Hx    Stomach cancer Neg Hx     CURRENT MEDICATIONS:  Current Outpatient Medications  Medication Sig Dispense Refill   apixaban (ELIQUIS) 5 MG TABS tablet Take 1 tablet (5 mg total) by mouth 2 (two) times daily. 90 tablet 5   No current facility-administered medications for this visit.    ALLERGIES:  Allergies  Allergen Reactions   Rivaroxaban Nausea Only    Other reaction(s): Nausea   Chocolate Diarrhea   Dabigatran Etexilate Mesylate     Other reaction(s): Nausea   Lactose Intolerance (Gi) Diarrhea   Meat [Alpha-Gal] Nausea Only and Other (See Comments)    And stomach pain   Other     Other reaction(s): Sick   Pradaxa [Dabigatran Etexilate Mesylate] Nausea Only    PHYSICAL EXAM:  Performance status (ECOG): 1 - Symptomatic but completely ambulatory  Vitals:   08/17/21 1404  BP: 126/75  Pulse: 78  Resp: 18  Temp: (!) 96.7 F (35.9 C)  SpO2: 99%   Wt Readings from Last 3 Encounters:  08/17/21 168 lb 6.4 oz (76.4 kg)   06/02/21 168 lb 12.8 oz (76.6 kg)  02/10/21 163 lb 12.8 oz (74.3 kg)   Physical Exam Vitals reviewed.  Constitutional:      Appearance: Normal appearance.  Cardiovascular:     Rate and Rhythm: Normal rate and regular rhythm.     Pulses: Normal pulses.     Heart sounds: Normal heart sounds.  Pulmonary:  Effort: Pulmonary effort is normal.     Breath sounds: Normal breath sounds.  Abdominal:     Palpations: Abdomen is soft. There is no hepatomegaly, splenomegaly or mass.     Tenderness: There is no abdominal tenderness.  Musculoskeletal:     Right lower leg: No edema.     Left lower leg: No edema.  Neurological:     General: No focal deficit present.     Mental Status: She is alert and oriented to person, place, and time.  Psychiatric:        Mood and Affect: Mood normal.        Behavior: Behavior normal.    LABORATORY DATA:  I have reviewed the labs as listed.  CBC Latest Ref Rng & Units 08/11/2021 01/25/2021 12/14/2020  WBC 4.0 - 10.5 K/uL 4.8 4.2 6.9  Hemoglobin 12.0 - 15.0 g/dL 78.9 38.1 01.7  Hematocrit 36.0 - 46.0 % 39.1 40.1 40.4  Platelets 150 - 400 K/uL 106(L) 101(L) 141(L)   CMP Latest Ref Rng & Units 08/11/2021 01/25/2021 11/28/2019  Glucose 70 - 99 mg/dL 92 510(C) 585(I)  BUN 8 - 23 mg/dL 12 9 11   Creatinine 0.44 - 1.00 mg/dL 7.78 2.42  Sodium 135 - 145 mmol/L 140 137 138  Potassium 3.5 - 5.1 mmol/L 4.1 3.5 3.7  Chloride 98 - 111 mmol/L 106 105 104  CO2 22 - 32 mmol/L 28 22 25   Calcium 8.9 - 10.3 mg/dL 9.0 3.53) 8.9  Total Protein 6.5 - 8.1 g/dL 7.0 7.3 7.0  Total Bilirubin 0.3 - 1.2 mg/dL 0.7 0.6 0.9  Alkaline Phos 38 - 126 U/L 134(H) 115 112  AST 15 - 41 U/L 21 25 21   ALT 0 - 44 U/L 21 20 20       Component Value Date/Time   RBC 3.94 08/11/2021 1400   MCV 99.2 08/11/2021 1400   MCH 32.5 08/11/2021 1400   MCHC 32.7 08/11/2021 1400   RDW 13.3 08/11/2021 1400   LYMPHSABS 1.5 08/11/2021 1400   MONOABS 0.4 08/11/2021 1400   EOSABS 0.2 08/11/2021  1400   BASOSABS 0.1 08/11/2021 1400    DIAGNOSTIC IMAGING:  I have independently reviewed the scans and discussed with the patient. No results found.   ASSESSMENT:  1.  Hypercoagulable state: -Unprovoked pulmonary embolism in August 2014, diagnosed in [facility] 10/11/2021, started on Coumadin (could not tolerate Xarelto and Pradaxa due to nausea). -Developed left femoral vein DVT on 04/10/2014 while patient on Coumadin, subsequently changed to Eliquis, discontinued in October 2015. -Hypercoagulable work-up on 09/15/2014 while patient off anticoagulation showed normal protein C, protein S, negative lupus anticoagulant, negative beta-2 glycoprotein 1 antibody, negative anticardiolipin 1 antibody, negative factor V Leiden and PT 20210A mutation. -She underwent Nissen fundoplication for acid reflux on 03/06/2018, stayed in the hospital for 2 to 3 days, received Lovenox prophylactic doses. 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  Patient was given Lovenox and transition to Eliquis which she has now taking. -As she has had 2 unprovoked DVTs, I have recommended indefinite anticoagulation. -We have checked Jak 2 and other mutation testing which was negative. -Lower extremely Doppler on 05/22/2018 was negative for blood clots.   PLAN:  1.  Recurrent thromboembolism: - She is tolerating Eliquis very well. - Denies any bleeding or bruising  episodes.  No prior history of miscarriages. - No family history of lupus anticoagulant. - Based on recurrent thromboembolism, she was recommended indefinite anticoagulation. - RTC 6 months.  2.  Thrombocytopenia: - Unclear etiology.  Platelet count today is 106.  No excessive bleeding. - Appears to be intermittently low since 08/12/2019, ranging  anywhere between 101-141. - CTAP on 08/06/2018 did not reveal any issues with liver or spleen. - We will check B12, folic acid, methylmalonic acid, copper and SPEP levels prior to next visit.    Orders placed this encounter:  No orders of the defined types were placed in this encounter.    Doreatha Massed, MD [facility] [phone]   I, Alda Ponder, am acting as a scribe for Dr. Doreatha Massed.  I, Doreatha Massed MD, have reviewed the above documentation for accuracy and completeness, and I agree with the above.

## 2021-08-17 ENCOUNTER — Inpatient Hospital Stay (HOSPITAL_BASED_OUTPATIENT_CLINIC_OR_DEPARTMENT_OTHER): Payer: Federal, State, Local not specified - PPO | Admitting: Hematology

## 2021-08-17 VITALS — BP 126/75 | HR 78 | Temp 96.7°F | Resp 18 | Wt 168.4 lb

## 2021-08-17 DIAGNOSIS — Z86718 Personal history of other venous thrombosis and embolism: Secondary | ICD-10-CM | POA: Diagnosis not present

## 2021-08-17 DIAGNOSIS — M25552 Pain in left hip: Secondary | ICD-10-CM | POA: Diagnosis not present

## 2021-08-17 DIAGNOSIS — Z86711 Personal history of pulmonary embolism: Secondary | ICD-10-CM | POA: Diagnosis not present

## 2021-08-17 DIAGNOSIS — D696 Thrombocytopenia, unspecified: Secondary | ICD-10-CM | POA: Diagnosis not present

## 2021-08-17 DIAGNOSIS — I81 Portal vein thrombosis: Secondary | ICD-10-CM | POA: Diagnosis not present

## 2021-08-17 DIAGNOSIS — D6859 Other primary thrombophilia: Secondary | ICD-10-CM | POA: Diagnosis not present

## 2021-08-17 DIAGNOSIS — Z7901 Long term (current) use of anticoagulants: Secondary | ICD-10-CM | POA: Diagnosis not present

## 2021-08-17 DIAGNOSIS — Z803 Family history of malignant neoplasm of breast: Secondary | ICD-10-CM | POA: Diagnosis not present

## 2021-08-17 DIAGNOSIS — Z87891 Personal history of nicotine dependence: Secondary | ICD-10-CM | POA: Diagnosis not present

## 2021-08-17 DIAGNOSIS — Z90710 Acquired absence of both cervix and uterus: Secondary | ICD-10-CM | POA: Diagnosis not present

## 2021-08-17 NOTE — Patient Instructions (Addendum)
[facility] at [facility] Discharge Instructions  You were seen and examined today by Dr. Ellin Saba. He reviewed your most recent labs and your platelets are down some. He is going to check further labs in 6 months to see what may be causing your platelets to trend downward. Please follow up as scheduled.   Thank you for choosing [facility] at [facility] to provide your oncology and hematology care.  To afford each patient quality time with our provider, please arrive at least 15 minutes before your scheduled appointment time.   If you have a lab appointment with the Cancer Center please come in thru the Main Entrance and check in at the main information desk.  You need to re-schedule your appointment should you arrive 10 or more minutes late.  We strive to give you quality time with our providers, and arriving late affects you and other patients whose appointments are after yours.  Also, if you no show three or more times for appointments you may be dismissed from the clinic at the providers discretion.     Again, thank you for choosing [facility].  Our hope is that these requests will decrease the amount of time that you wait before being seen by our physicians.       _____________________________________________________________  Should you have questions after your visit to [facility], please contact our office at [phone] and follow the prompts.  Our office hours are 8:00 a.m. and 4:30 p.m. Monday - Friday.  Please note that voicemails left after 4:00 p.m. may not be returned until the following business day.  We are closed weekends and major holidays.  You do have access to a nurse 24-7, just call the main number to the clinic [phone] and do not press any options, hold on the line and a nurse will answer the phone.    For prescription refill requests, have your pharmacy contact our office and allow 72 hours.     Due to Covid, you will need to wear a mask upon entering the hospital. If you do not have a mask, a mask will be given to you at the Main Entrance upon arrival. For doctor visits, patients may have 1 support person age 54 or older with them. For treatment visits, patients can not have anyone with them due to social distancing guidelines and our immunocompromised population.

## 2021-08-22 DIAGNOSIS — R6882 Decreased libido: Secondary | ICD-10-CM | POA: Diagnosis not present

## 2021-08-22 DIAGNOSIS — R5382 Chronic fatigue, unspecified: Secondary | ICD-10-CM | POA: Diagnosis not present

## 2021-08-22 DIAGNOSIS — L659 Nonscarring hair loss, unspecified: Secondary | ICD-10-CM | POA: Diagnosis not present

## 2021-08-31 DIAGNOSIS — R6882 Decreased libido: Secondary | ICD-10-CM | POA: Diagnosis not present

## 2021-08-31 DIAGNOSIS — E559 Vitamin D deficiency, unspecified: Secondary | ICD-10-CM | POA: Diagnosis not present

## 2021-08-31 DIAGNOSIS — R5383 Other fatigue: Secondary | ICD-10-CM | POA: Diagnosis not present

## 2021-08-31 DIAGNOSIS — L659 Nonscarring hair loss, unspecified: Secondary | ICD-10-CM | POA: Diagnosis not present

## 2021-10-24 DIAGNOSIS — J069 Acute upper respiratory infection, unspecified: Secondary | ICD-10-CM | POA: Diagnosis not present

## 2021-10-24 DIAGNOSIS — Z760 Encounter for issue of repeat prescription: Secondary | ICD-10-CM | POA: Diagnosis not present

## 2021-10-24 DIAGNOSIS — J4520 Mild intermittent asthma, uncomplicated: Secondary | ICD-10-CM | POA: Diagnosis not present

## 2021-11-01 DIAGNOSIS — E559 Vitamin D deficiency, unspecified: Secondary | ICD-10-CM | POA: Diagnosis not present

## 2021-11-01 DIAGNOSIS — L659 Nonscarring hair loss, unspecified: Secondary | ICD-10-CM | POA: Diagnosis not present

## 2021-11-01 DIAGNOSIS — E538 Deficiency of other specified B group vitamins: Secondary | ICD-10-CM | POA: Diagnosis not present

## 2021-11-01 DIAGNOSIS — M8589 Other specified disorders of bone density and structure, multiple sites: Secondary | ICD-10-CM | POA: Diagnosis not present

## 2021-11-01 DIAGNOSIS — R6882 Decreased libido: Secondary | ICD-10-CM | POA: Diagnosis not present

## 2021-11-01 DIAGNOSIS — R5383 Other fatigue: Secondary | ICD-10-CM | POA: Diagnosis not present

## 2021-11-08 ENCOUNTER — Other Ambulatory Visit (HOSPITAL_BASED_OUTPATIENT_CLINIC_OR_DEPARTMENT_OTHER): Payer: Self-pay | Admitting: Nurse Practitioner

## 2021-11-08 DIAGNOSIS — Z1239 Encounter for other screening for malignant neoplasm of breast: Secondary | ICD-10-CM

## 2021-11-14 ENCOUNTER — Other Ambulatory Visit: Payer: Self-pay

## 2021-11-14 ENCOUNTER — Encounter (HOSPITAL_BASED_OUTPATIENT_CLINIC_OR_DEPARTMENT_OTHER): Payer: Self-pay

## 2021-11-14 ENCOUNTER — Ambulatory Visit (HOSPITAL_BASED_OUTPATIENT_CLINIC_OR_DEPARTMENT_OTHER)
Admission: RE | Admit: 2021-11-14 | Discharge: 2021-11-14 | Disposition: A | Discharge status: Home / Self Care | Payer: Federal, State, Local not specified - PPO | Source: Ambulatory Visit | Attending: Nurse Practitioner | Admitting: Nurse Practitioner

## 2021-11-14 DIAGNOSIS — Z1231 Encounter for screening mammogram for malignant neoplasm of breast: Secondary | ICD-10-CM | POA: Insufficient documentation

## 2021-11-14 DIAGNOSIS — Z1239 Encounter for other screening for malignant neoplasm of breast: Secondary | ICD-10-CM

## 2022-01-06 DIAGNOSIS — R5383 Other fatigue: Secondary | ICD-10-CM | POA: Diagnosis not present

## 2022-01-06 DIAGNOSIS — E559 Vitamin D deficiency, unspecified: Secondary | ICD-10-CM | POA: Diagnosis not present

## 2022-01-06 DIAGNOSIS — Z1329 Encounter for screening for other suspected endocrine disorder: Secondary | ICD-10-CM | POA: Diagnosis not present

## 2022-01-06 DIAGNOSIS — R5381 Other malaise: Secondary | ICD-10-CM | POA: Diagnosis not present

## 2022-02-13 ENCOUNTER — Encounter: Payer: Self-pay | Admitting: Obstetrics & Gynecology

## 2022-02-13 ENCOUNTER — Ambulatory Visit (INDEPENDENT_AMBULATORY_CARE_PROVIDER_SITE_OTHER): Payer: Federal, State, Local not specified - PPO | Admitting: Obstetrics & Gynecology

## 2022-02-13 VITALS — BP 123/70 | HR 85 | Ht 69.0 in

## 2022-02-13 DIAGNOSIS — I82402 Acute embolism and thrombosis of unspecified deep veins of left lower extremity: Secondary | ICD-10-CM | POA: Diagnosis not present

## 2022-02-13 DIAGNOSIS — B009 Herpesviral infection, unspecified: Secondary | ICD-10-CM

## 2022-02-13 DIAGNOSIS — I81 Portal vein thrombosis: Secondary | ICD-10-CM | POA: Diagnosis not present

## 2022-02-13 DIAGNOSIS — N959 Unspecified menopausal and perimenopausal disorder: Secondary | ICD-10-CM

## 2022-02-13 NOTE — Progress Notes (Addendum)
? ?GYN VISIT ?Patient name: Whitney Peters MRN [id number]  Date of birth: 1961/02/12 ?Chief Complaint:   ?"hormones have been crazy" ? ?History of Present Illness:   ?Whitney Peters is a 61 y.o. G3P0 PM, PH female being seen today for the following concerns: ? ?-She notes mood swings, decreased sex drive, hair falling out, change in sleep, hot flashes, vaginal dryness, anxiety.  This has been an ongoing issue for several mos.  She has not tried any medication or supplement. ? ?Pt seen by Dr. Gayla Medicus- endocrinologist- lab work reviewed no abnormalities noted ? ?No LMP recorded. Patient has had a hysterectomy. ? ? ?  02/13/2022  ?  1:49 PM 12/16/2014  ?  5:00 PM 04/10/2014  ? 10:34 AM 02/02/2014  ?  2:02 PM  ?Depression screen PHQ 2/9  ?Decreased Interest 0 0 0 0  ?Down, Depressed, Hopeless 0 0 0 0  ?PHQ - 2 Score 0 0 0 0  ?Altered sleeping 3     ?Tired, decreased energy 3     ?Change in appetite 2     ?Feeling bad or failure about yourself  0     ?Trouble concentrating 0     ?Moving slowly or fidgety/restless 0     ?Suicidal thoughts 0     ?PHQ-9 Score 8     ? ? ? ?Review of Systems:   ?Pertinent items are noted in HPI ?Denies fever/chills, dizziness, headaches, visual disturbances, fatigue, shortness of breath, chest pain, abdominal pain, vomiting, bowel movements, urination unless otherwise stated above.  ?Pertinent History Reviewed:  ?Reviewed past medical,surgical, social, obstetrical and family history.  ?Reviewed problem list, medications and allergies. ?Physical Assessment:  ? ?Vitals:  ? 02/13/22 1348  ?BP: 123/70  ?Pulse: 85  ?Height: 5\' 9"  (1.753 m)  ?Body mass index is 24.87 kg/m?. ? ?     Physical Examination:  ? General appearance: alert, well appearing, and in no distress ? Psych: mood appropriate, normal affect ? Skin: warm & dry  ? Cardiovascular: RRR ? Respiratory: normal respiratory effort, CTAB ? Abdomen: soft, non-tender  ? Pelvic: examination not indicated ? Extremities: no edema  ? ?Chaperone:  N/A   ? ?Assessment & Plan:  ?1) Menopausal disorder ?-reassured pt that symptoms are consistent with menopause ?-due to her prior medical hx not a candidate for HRT ?-discussed herbal supplements ?-also discussed lifestyle changes- cutting back EtOH, caffeine and regular exercise ?-discussed alternative options such as SSRI or gabapentin- declined at this time ? ?2) Decreased libido ?-discussed topical testosterone- reviewed risk/benefits ?-pt may reconsider at next visit ? ? ?Return in about 3 months (around 05/15/2022) for follow up in 3 mos. ? ?ADDENDUM: ?Pt called in because she thought we had discussed refilling her medication.  Pt has h/o HSV and using both valtrex orally and topical acyclovir when needed.  Rx sent in.  See phone encounter for further information. ? ?Meds ordered this encounter  ?Medications  ? acyclovir ointment (ZOVIRAX) 5 %  ?  Sig: Apply topically 3 (three) times daily as needed.  ?  Dispense:  5 g  ?  Refill:  3  ? valACYclovir (VALTREX) 1000 MG tablet  ?  Sig: Take 1 tablet (1,000 mg total) by mouth 2 (two) times daily for 7 days.  ?  Dispense:  14 tablet  ?  Refill:  3  ? lidocaine (XYLOCAINE) 5 % ointment  ?  Sig: Apply 1 application. topically as needed.  ?  Dispense:  35.44 g  ?  Refill:  1  ? ? ?Myna Hidalgo, DO ?Attending Obstetrician & Gynecologist, Faculty Practice ?Center for [employer], [facility] ? ? ? ?

## 2022-02-15 ENCOUNTER — Inpatient Hospital Stay (HOSPITAL_COMMUNITY): Payer: Federal, State, Local not specified - PPO | Admitting: Hematology

## 2022-02-15 ENCOUNTER — Telehealth: Payer: Self-pay

## 2022-02-15 ENCOUNTER — Inpatient Hospital Stay (HOSPITAL_COMMUNITY): Payer: Federal, State, Local not specified - PPO | Attending: Hematology

## 2022-02-15 DIAGNOSIS — E538 Deficiency of other specified B group vitamins: Secondary | ICD-10-CM | POA: Insufficient documentation

## 2022-02-15 DIAGNOSIS — I81 Portal vein thrombosis: Secondary | ICD-10-CM

## 2022-02-15 LAB — LACTATE DEHYDROGENASE: LDH: 157 U/L (ref 98–192)

## 2022-02-15 LAB — CBC WITH DIFFERENTIAL/PLATELET
Abs Immature Granulocytes: 0.01 10*3/uL (ref 0.00–0.07)
Basophils Absolute: 0 10*3/uL (ref 0.0–0.1)
Basophils Relative: 1 %
Eosinophils Absolute: 0.2 10*3/uL (ref 0.0–0.5)
Eosinophils Relative: 4 %
HCT: 38.8 % (ref 36.0–46.0)
Hemoglobin: 12.9 g/dL (ref 12.0–15.0)
Immature Granulocytes: 0 %
Lymphocytes Relative: 27 %
Lymphs Abs: 1.2 10*3/uL (ref 0.7–4.0)
MCH: 31.6 pg (ref 26.0–34.0)
MCHC: 33.2 g/dL (ref 30.0–36.0)
MCV: 95.1 fL (ref 80.0–100.0)
Monocytes Absolute: 0.3 10*3/uL (ref 0.1–1.0)
Monocytes Relative: 7 %
Neutro Abs: 2.7 10*3/uL (ref 1.7–7.7)
Neutrophils Relative %: 61 %
Platelets: 121 10*3/uL — ABNORMAL LOW (ref 150–400)
RBC: 4.08 MIL/uL (ref 3.87–5.11)
RDW: 14.2 % (ref 11.5–15.5)
WBC: 4.5 10*3/uL (ref 4.0–10.5)
nRBC: 0 % (ref 0.0–0.2)

## 2022-02-15 LAB — RETICULOCYTES
Immature Retic Fract: 12.2 % (ref 2.3–15.9)
RBC.: 4.16 MIL/uL (ref 3.87–5.11)
Retic Count, Absolute: 58.2 10*3/uL (ref 19.0–186.0)
Retic Ct Pct: 1.4 % (ref 0.4–3.1)

## 2022-02-15 LAB — VITAMIN B12: Vitamin B-12: 134 pg/mL — ABNORMAL LOW (ref 180–914)

## 2022-02-15 LAB — FOLATE: Folate: 14.1 ng/mL (ref >5.9)

## 2022-02-15 NOTE — Telephone Encounter (Signed)
PT CALLED AND STATED THAT SHE WAS HERE ON 02/13/22 AND A PRESCRIPTION WAS TO BE CALLED IN THE HER PHARMACY AND IT HAS NOT BEEN SENT.  PT WOULD ALSO LIKE FOR SOMEONE TO LET HER KNOW WHEN IT IS CALLED IN, BECAUSE SHE LIVES A DISTANCE FROM THE PHARMACY. ?

## 2022-02-16 ENCOUNTER — Telehealth: Payer: Self-pay | Admitting: Obstetrics & Gynecology

## 2022-02-16 ENCOUNTER — Encounter: Payer: Self-pay | Admitting: Obstetrics & Gynecology

## 2022-02-16 MED ORDER — LIDOCAINE 5 % EX OINT: 1.0000 "application " | TOPICAL_OINTMENT | CUTANEOUS | 1 refills | Status: AC | PRN

## 2022-02-16 MED ORDER — VALACYCLOVIR HCL 1 G PO TABS: 1000.0000 mg | ORAL_TABLET | Freq: Two times a day (BID) | ORAL | 3 refills | Status: AC

## 2022-02-16 MED ORDER — ACYCLOVIR 5 % EX OINT: TOPICAL_OINTMENT | Freq: Three times a day (TID) | CUTANEOUS | 3 refills | Status: AC | PRN

## 2022-02-16 NOTE — Telephone Encounter (Signed)
Called patient back- she notes occasional HSV outbreaks that require treatment.  She typically takes valtrex and/or acyclovir topical.  She uses the topical when she struggles taking the pills.  Also used lidocaine topically to help with the discomfort. ? ?Rx sent in through prior progress note. ? ?Myna Hidalgo, DO ?Attending Obstetrician & Gynecologist, Faculty Practice ?[facility] for [employer], [facility] ? ? ?

## 2022-02-16 NOTE — Addendum Note (Signed)
Addended by: Sharon Seller on: 02/16/2022 11:07 AM ? ? Modules accepted: Orders ? ?

## 2022-02-16 NOTE — Telephone Encounter (Signed)
Patient is calling about medications that weren't sent to pharmacy. She'd also like a call when they are sent because she lives a distance away from pharmacy. She uses Secretary/administrator in [place], [place] ?

## 2022-02-17 LAB — PROTEIN ELECTROPHORESIS, SERUM
A/G Ratio: 1 (ref 0.7–1.7)
Albumin ELP: 3.5 g/dL (ref 2.9–4.4)
Alpha-1-Globulin: 0.2 g/dL (ref 0.0–0.4)
Alpha-2-Globulin: 0.7 g/dL (ref 0.4–1.0)
Beta Globulin: 1.2 g/dL (ref 0.7–1.3)
Gamma Globulin: 1.3 g/dL (ref 0.4–1.8)
Globulin, Total: 3.4 g/dL (ref 2.2–3.9)
Total Protein ELP: 6.9 g/dL (ref 6.0–8.5)

## 2022-02-17 LAB — COPPER, SERUM: Copper: 109 ug/dL (ref 80–158)

## 2022-02-18 LAB — METHYLMALONIC ACID, SERUM: Methylmalonic Acid, Quantitative: 378 nmol/L (ref 0–378)

## 2022-02-21 ENCOUNTER — Inpatient Hospital Stay (HOSPITAL_BASED_OUTPATIENT_CLINIC_OR_DEPARTMENT_OTHER): Payer: Federal, State, Local not specified - PPO | Admitting: Hematology

## 2022-02-21 ENCOUNTER — Inpatient Hospital Stay (HOSPITAL_COMMUNITY): Payer: Federal, State, Local not specified - PPO

## 2022-02-21 VITALS — BP 130/72 | HR 68 | Temp 98.4°F | Resp 20 | Ht 68.9 in | Wt 174.4 lb

## 2022-02-21 DIAGNOSIS — E538 Deficiency of other specified B group vitamins: Secondary | ICD-10-CM | POA: Diagnosis not present

## 2022-02-21 DIAGNOSIS — I81 Portal vein thrombosis: Secondary | ICD-10-CM

## 2022-02-21 MED ORDER — CYANOCOBALAMIN 1000 MCG/ML IJ SOLN
1000.0000 ug | Freq: Once | INTRAMUSCULAR | Status: AC
  Administered 2022-02-21: 1000 ug via INTRAMUSCULAR
  Filled 2022-02-21: qty 1

## 2022-02-21 NOTE — Progress Notes (Unsigned)
Patient in today for office visit. Dr. Delton Coombes ordered B-12 injection. See MAR for injection information. Patient remained stable throughout injection. Patient discharged from clinic ambulatory and in stable condition. ? ?

## 2022-02-21 NOTE — Progress Notes (Signed)
? ?[facility] ?[address] ?[place], [place] [address] ? ? ?CLINIC:  ?Medical Oncology/Hematology ? ?PCP:  ?Lenell Antu, DO ?[address] [place] [address]  ?[phone] ? ?REASON FOR VISIT:  ?Follow-up for portal vein thrombosis ? ?PRIOR THERAPY:  ?1. Coumadin from 06/2013 to 04/10/2014-developed femoral vein DVT. ?2. Eliquis from 04/2014 to 08/2014. ? ?CURRENT THERAPY: Eliquis 5 mg BID ? ?INTERVAL HISTORY:  ?Whitney Peters, a 61 y.o. female, returns for routine follow-up for her portal vein thrombosis. Whitney Peters was last seen on 08/17/2021. ? ?Today she reports feeling well. She is not taking vitamin B-12 tablets as she reports they made her nauseous. She reports fatigue. She denies numbness/tingling and bleeding.  ? ?REVIEW OF SYSTEMS:  ?Review of Systems  ?Constitutional:  Positive for appetite change and fatigue.  ?HENT:   Negative for nosebleeds.   ?Respiratory:  Negative for hemoptysis.   ?Gastrointestinal:  Negative for blood in stool and nausea.  ?Genitourinary:  Negative for hematuria and vaginal bleeding.   ?Neurological:  Negative for numbness.  ?Hematological:  Does not bruise/bleed easily.  ?Psychiatric/Behavioral:  Positive for sleep disturbance.   ?All other systems reviewed and are negative. ? ?PAST MEDICAL/SURGICAL HISTORY:  ?Past Medical History:  ?Diagnosis Date  ? Allergy   ? seasonal  ? Anticoagulated on warfarin   ? off since 2015  ? Arthritis   ? ankle- left   ? Asthma   ? no problems in years, rarely uses inhaler  ? Clotting disorder (HCC)   ? DVT,PE  ? DVT (deep venous thrombosis) (HCC)   ? left leg in 2014;  no DVT now    ? Family history of breast cancer   ? GERD (gastroesophageal reflux disease)   ? Heart murmur   ? pt denies  ? Hyperlipidemia   ? PE (pulmonary embolism) 04/2014  ? gone   ? Ruptured or perforated eardrum, bilateral   ? Uses hearing aid   ? bilateral  ? Wears partial dentures   ? upper dentures and lower partial  ? ?Past Surgical History:   ?Procedure Laterality Date  ? 83 HOUR PH STUDY N/A 01/02/2018  ? Procedure: 24 HOUR PH STUDY;  Surgeon: Napoleon Form, MD;  Location: [facility];  Service: Endoscopy;  Laterality: N/A;  ? ABDOMINAL HYSTERECTOMY    ? APPENDECTOMY    ? COLONOSCOPY    ? ESOPHAGEAL MANOMETRY N/A 01/02/2018  ? Procedure: ESOPHAGEAL MANOMETRY (EM);  Surgeon: Napoleon Form, MD;  Location: [facility];  Service: Endoscopy;  Laterality: N/A;  ? ESOPHAGOGASTRODUODENOSCOPY (EGD) WITH ESOPHAGEAL DILATION    ? x6  ? FOOT SURGERY Bilateral   ? BONE SPURS  ? KNEE SURGERY Right 2011  ? arthroscopy ; in [place]   ? NISSEN FUNDOPLICATION  03/2018  ? OPEN REDUCTION INTERNAL FIXATION (ORIF) DISTAL RADIAL FRACTURE Right 12/02/2014  ? Procedure: OPEN REDUCTION INTERNAL FIXATION (ORIF) DISTAL RADIAL FRACTURE;  Surgeon: Eldred Manges, MD;  Location: [facility];  Service: Orthopedics;  Laterality: Right;  ? UPPER GASTROINTESTINAL ENDOSCOPY    ? WRIST SURGERY    ? "broke radius bone and theres plates and screws there now ;  Dr Ophelia Charter did"   ? ? ?SOCIAL HISTORY:  ?Social History  ? ?Socioeconomic History  ? Marital status: Married  ?  Spouse name: Not on file  ? Number of children: Not on file  ? Years of education: Not on file  ? Highest education level:  Not on file  ?Occupational History  ? Occupation: Consulting civil engineertudent   ?  Comment: CMA  ?Tobacco Use  ? Smoking status: Former  ?  Packs/day: 20.00  ?  Types: Cigarettes  ?  Quit date: 11/07/1991  ?  Years since quitting: 30.3  ? Smokeless tobacco: Never  ?Vaping Use  ? Vaping Use: Never used  ?Substance and Sexual Activity  ? Alcohol use: No  ?  Alcohol/week: 0.0 standard drinks  ? Drug use: No  ? Sexual activity: Yes  ?  Birth control/protection: Surgical, Other-see comments  ?  Comment: Hysterectomy  ?Other Topics Concern  ? Not on file  ?Social History Narrative  ? Not on file  ? ?Social Determinants of Health  ? ?Financial Resource Strain: Low Risk   ? Difficulty of Paying Living Expenses: Not hard at  all  ?Food Insecurity: No Food Insecurity  ? Worried About Programme researcher, broadcasting/film/videounning Out of Food in the Last Year: Never true  ? Ran Out of Food in the Last Year: Never true  ?Transportation Needs: No Transportation Needs  ? Lack of Transportation (Medical): No  ? Lack of Transportation (Non-Medical): No  ?Physical Activity: Insufficiently Active  ? Days of Exercise per Week: 1 day  ? Minutes of Exercise per Session: 30 min  ?Stress: Stress Concern Present  ? Feeling of Stress : Very much  ?Social Connections: Unknown  ? Frequency of Communication with Friends and Family: Patient refused  ? Frequency of Social Gatherings with Friends and Family: Patient refused  ? Attends Religious Services: Patient refused  ? Active Member of Clubs or Organizations: Patient refused  ? Attends BankerClub or Organization Meetings: Patient refused  ? Marital Status: Patient refused  ?Intimate Partner Violence: Not At Risk  ? Fear of Current or Ex-Partner: No  ? Emotionally Abused: No  ? Physically Abused: No  ? Sexually Abused: No  ? ? ?FAMILY HISTORY:  ?Family History  ?Problem Relation Age of Onset  ? Breast cancer Maternal Grandmother 2265  ?     deceased 3170s  ? Diabetes Maternal Grandmother   ? Heart disease Father   ?     PACEMAKER  ? Cancer Father   ?     penile cancer; currently 3283  ? Heart disease Mother   ? Dementia Mother   ?     deceased [id number]  ? Transient ischemic attack Mother   ? Kidney failure Brother   ? Suicidality Brother   ? Breast cancer Sister [id number]  ?     currently 2856  ? Lung cancer Sister   ? Esophageal cancer Maternal Uncle   ? Colon cancer Neg Hx   ? Colon polyps Neg Hx   ? Rectal cancer Neg Hx   ? Stomach cancer Neg Hx   ? ? ?CURRENT MEDICATIONS:  ?Current Outpatient Medications  ?Medication Sig Dispense Refill  ? acyclovir ointment (ZOVIRAX) 5 % Apply topically 3 (three) times daily as needed. 5 g 3  ? apixaban (ELIQUIS) 5 MG TABS tablet Take 1 tablet (5 mg total) by mouth 2 (two) times daily. 90 tablet 5  ? Cholecalciferol (VITAMIN D3)  125 MCG (5000 UT) CAPS Take by mouth.    ? lidocaine (XYLOCAINE) 5 % ointment Apply 1 application. topically as needed. 35.44 g 1  ? valACYclovir (VALTREX) 1000 MG tablet Take 1 tablet (1,000 mg total) by mouth 2 (two) times daily for 7 days. 14 tablet 3  ? ?No current facility-administered medications for this visit.  ? ? ?  ALLERGIES:  ?Allergies  ?Allergen Reactions  ? Rivaroxaban Nausea Only  ?  Other reaction(s): Nausea  ? Chocolate Diarrhea  ? Coumadin [Warfarin]   ? Dabigatran Etexilate Mesylate   ?  Other reaction(s): Nausea  ? Lactose Intolerance (Gi) Diarrhea  ? Meat [Alpha-Gal] Nausea Only and Other (See Comments)  ?  And stomach pain  ? Other   ?  Other reaction(s): Sick  ? Pradaxa [Dabigatran Etexilate Mesylate] Nausea Only  ? ? ?PHYSICAL EXAM:  ?Performance status (ECOG): 1 - Symptomatic but completely ambulatory ? ?Vitals:  ? 02/21/22 0929  ?BP: 130/72  ?Pulse: 68  ?Resp: 20  ?Temp: 98.4 ?F (36.9 ?C)  ?SpO2: 96%  ? ?Wt Readings from Last 3 Encounters:  ?02/21/22 174 lb 6.4 oz (79.1 kg)  ?08/17/21 168 lb 6.4 oz (76.4 kg)  ?06/02/21 168 lb 12.8 oz (76.6 kg)  ? ?Physical Exam ?Vitals reviewed.  ?Constitutional:   ?   Appearance: Normal appearance.  ?Cardiovascular:  ?   Rate and Rhythm: Normal rate and regular rhythm.  ?   Pulses: Normal pulses.  ?   Heart sounds: Normal heart sounds.  ?Pulmonary:  ?   Effort: Pulmonary effort is normal.  ?   Breath sounds: Normal breath sounds.  ?Abdominal:  ?   Palpations: Abdomen is soft. There is no hepatomegaly, splenomegaly or mass.  ?   Tenderness: There is no abdominal tenderness.  ?Neurological:  ?   General: No focal deficit present.  ?   Mental Status: She is alert and oriented to person, place, and time.  ?Psychiatric:     ?   Mood and Affect: Mood normal.     ?   Behavior: Behavior normal.  ? ? ?LABORATORY DATA:  ?I have reviewed the labs as listed.  ? ?  Latest Ref Rng & Units 02/15/2022  ? 12:47 PM 08/11/2021  ?  2:00 PM 01/25/2021  ? 12:40 PM  ?CBC  ?WBC 4.0 -  10.5 K/uL 4.5   4.8   4.2    ?Hemoglobin 12.0 - 15.0 g/dL 58.5   27.7   82.4    ?Hematocrit 36.0 - 46.0 % 38.8   39.1   40.1    ?Platelets 150 - 400 K/uL 121   106   101    ? ? ?  Latest Ref Rng & Units 08/12/19

## 2022-02-21 NOTE — Patient Instructions (Addendum)
[facility] at [facility] ?Discharge Instructions ? ? ?You were seen and examined today by Dr. Ellin Saba. ? ?He reviewed your lab results. Your B12 is low. We will give you an injection today, then you should get over the counter B12 sublingual (under the tongue) tablet 1 mg (1000 mcg) daily.  ? ?Return as scheduled.  ? ? ?Thank you for choosing [facility] at [facility] to provide your oncology and hematology care.  To afford each patient quality time with our provider, please arrive at least 15 minutes before your scheduled appointment time.  ? ?If you have a lab appointment with the Cancer Center please come in thru the Main Entrance and check in at the main information desk. ? ?You need to re-schedule your appointment should you arrive 10 or more minutes late.  We strive to give you quality time with our providers, and arriving late affects you and other patients whose appointments are after yours.  Also, if you no show three or more times for appointments you may be dismissed from the clinic at the providers discretion.     ?Again, thank you for choosing [facility].  Our hope is that these requests will decrease the amount of time that you wait before being seen by our physicians.       ?_____________________________________________________________ ? ?Should you have questions after your visit to [facility], please contact our office at [phone] and follow the prompts.  Our office hours are 8:00 a.m. and 4:30 p.m. Monday - Friday.  Please note that voicemails left after 4:00 p.m. may not be returned until the following business day.  We are closed weekends and major holidays.  You do have access to a nurse 24-7, just call the main number to the clinic [phone] and do not press any options, hold on the line and a nurse will answer the phone.   ? ?For prescription refill requests, have your pharmacy contact our office and allow  72 hours.   ? ?Due to Covid, you will need to wear a mask upon entering the hospital. If you do not have a mask, a mask will be given to you at the Main Entrance upon arrival. For doctor visits, patients may have 1 support person age 83 or older with them. For treatment visits, patients can not have anyone with them due to social distancing guidelines and our immunocompromised population.  ? ?   ?

## 2022-03-01 DIAGNOSIS — R5383 Other fatigue: Secondary | ICD-10-CM | POA: Diagnosis not present

## 2022-03-01 DIAGNOSIS — E559 Vitamin D deficiency, unspecified: Secondary | ICD-10-CM | POA: Diagnosis not present

## 2022-03-01 DIAGNOSIS — E28319 Asymptomatic premature menopause: Secondary | ICD-10-CM | POA: Diagnosis not present

## 2022-03-01 DIAGNOSIS — L659 Nonscarring hair loss, unspecified: Secondary | ICD-10-CM | POA: Diagnosis not present

## 2022-04-04 ENCOUNTER — Other Ambulatory Visit: Payer: Self-pay | Admitting: Hematology and Oncology

## 2022-04-04 DIAGNOSIS — I81 Portal vein thrombosis: Secondary | ICD-10-CM

## 2022-05-18 ENCOUNTER — Other Ambulatory Visit: Payer: Self-pay | Admitting: Orthopedic Surgery

## 2022-05-18 DIAGNOSIS — M5451 Vertebrogenic low back pain: Secondary | ICD-10-CM

## 2022-05-18 DIAGNOSIS — M545 Low back pain, unspecified: Secondary | ICD-10-CM | POA: Diagnosis not present

## 2022-06-09 ENCOUNTER — Ambulatory Visit
Admission: RE | Admit: 2022-06-09 | Discharge: 2022-06-09 | Disposition: A | Discharge status: Home / Self Care | Payer: Federal, State, Local not specified - PPO | Source: Ambulatory Visit | Attending: Orthopedic Surgery | Admitting: Orthopedic Surgery

## 2022-06-09 DIAGNOSIS — M545 Low back pain, unspecified: Secondary | ICD-10-CM | POA: Diagnosis not present

## 2022-06-09 DIAGNOSIS — M5451 Vertebrogenic low back pain: Secondary | ICD-10-CM

## 2022-06-15 ENCOUNTER — Other Ambulatory Visit: Payer: Federal, State, Local not specified - PPO

## 2022-06-16 DIAGNOSIS — H903 Sensorineural hearing loss, bilateral: Secondary | ICD-10-CM | POA: Diagnosis not present

## 2022-06-16 DIAGNOSIS — H6992 Unspecified Eustachian tube disorder, left ear: Secondary | ICD-10-CM | POA: Diagnosis not present

## 2022-06-21 DIAGNOSIS — M5451 Vertebrogenic low back pain: Secondary | ICD-10-CM | POA: Diagnosis not present

## 2022-06-28 DIAGNOSIS — Z6825 Body mass index (BMI) 25.0-25.9, adult: Secondary | ICD-10-CM | POA: Diagnosis not present

## 2022-06-28 DIAGNOSIS — Z7901 Long term (current) use of anticoagulants: Secondary | ICD-10-CM | POA: Diagnosis not present

## 2022-06-28 DIAGNOSIS — Z86718 Personal history of other venous thrombosis and embolism: Secondary | ICD-10-CM | POA: Diagnosis not present

## 2022-06-28 DIAGNOSIS — R635 Abnormal weight gain: Secondary | ICD-10-CM | POA: Diagnosis not present

## 2022-07-25 DIAGNOSIS — M5416 Radiculopathy, lumbar region: Secondary | ICD-10-CM | POA: Diagnosis not present

## 2022-07-31 ENCOUNTER — Other Ambulatory Visit: Payer: Self-pay

## 2022-07-31 ENCOUNTER — Ambulatory Visit: Payer: Federal, State, Local not specified - PPO | Attending: Orthopedic Surgery | Admitting: Physical Therapy

## 2022-07-31 ENCOUNTER — Encounter: Payer: Self-pay | Admitting: Physical Therapy

## 2022-07-31 DIAGNOSIS — M5459 Other low back pain: Secondary | ICD-10-CM | POA: Insufficient documentation

## 2022-07-31 NOTE — Therapy (Signed)
OUTPATIENT PHYSICAL THERAPY THORACOLUMBAR EVALUATION   Patient Name: Whitney Peters MRN: [id number] DOB:1961/10/11, 61 y.o., female Today's Date: 07/31/2022   PT End of Session - 07/31/22 1024     Visit Number 1    Number of Visits 12    Date for PT Re-Evaluation 08/28/22    Authorization Type FOTO AT LEAST EVERY 5TH VISIT.  PROGRESS NOTE AT 10TH VISIT.  KX MODIFIER AFTER 15 VISITS.    PT Start Time 0945    Activity Tolerance Patient tolerated treatment well    Behavior During Therapy WFL for tasks assessed/performed             Past Medical History:  Diagnosis Date   Allergy    seasonal   Anticoagulated on warfarin    off since 2015   Arthritis    ankle- left    Asthma    no problems in years, rarely uses inhaler   Clotting disorder (HCC)    DVT,PE   DVT (deep venous thrombosis) (HCC)    left leg in 2014;  no DVT now     Family history of breast cancer    GERD (gastroesophageal reflux disease)    Heart murmur    pt denies   Hyperlipidemia    PE (pulmonary embolism) 04/2014   gone    Ruptured or perforated eardrum, bilateral    Uses hearing aid    bilateral   Wears partial dentures    upper dentures and lower partial   Past Surgical History:  Procedure Laterality Date   68 HOUR [place] STUDY N/A 01/02/2018   Procedure: 24 HOUR PH STUDY;  Surgeon: [name], MD;  [facility]: [facility];  Service: Endoscopy;  Laterality: N/A;   ABDOMINAL HYSTERECTOMY     APPENDECTOMY     COLONOSCOPY     ESOPHAGEAL MANOMETRY N/A 01/02/2018   Procedure: ESOPHAGEAL MANOMETRY (EM);  Surgeon: [name], MD;  [facility]: [facility];  Service: Endoscopy;  Laterality: N/A;   ESOPHAGOGASTRODUODENOSCOPY (EGD) WITH ESOPHAGEAL DILATION     x6   FOOT SURGERY Bilateral    BONE SPURS   KNEE SURGERY Right 2011   arthroscopy ; in [place]  01/5464   OPEN REDUCTION INTERNAL FIXATION (ORIF) DISTAL RADIAL FRACTURE Right 12/02/2014   Procedure:  OPEN REDUCTION INTERNAL FIXATION (ORIF) DISTAL RADIAL FRACTURE;  Surgeon: [name], MD;  Location: [facility];  Service: Orthopedics;  Laterality: Right;   UPPER GASTROINTESTINAL ENDOSCOPY     WRIST SURGERY     "broke radius bone and theres plates and screws there now ;  Dr Lorin Mercy did"      REFERRING PROVIDER: Melina Schools MD  REFERRING DIAG: Vertebrogenic low back pain.  Rationale for Evaluation and Treatment Rehabilitation  THERAPY DIAG:  Other low back pain  ONSET DATE: July 2013.  SUBJECTIVE:  SUBJECTIVE STATEMENT: The patient presents to the clinic with c/o low back pain as the result of hitting a hole while mowing.  She states she came out of the seat and then landed hard back on the seat causing low back pain.  She rates her pain at a 6/10 today with pain increasing with  a lot of standing and bending.  Ice and heat decrease her pain.  She describes her pain as sore and aching.  The pain has interfered with her daily life as she has to do things much slower.   PERTINENT HISTORY:  Asthma, PE (04/2014).  PAIN:  Are you having pain? Yes: NPRS scale: 6/10 Pain location: L-S region. Pain description: As above. Aggravating factors: As above. Relieving factors: As above.   PRECAUTIONS: None  WEIGHT BEARING RESTRICTIONS No  FALLS:  Has patient fallen in last 6 months? No  LIVING ENVIRONMENT: Lives with: lives with their spouse Lives in: House/apartment Has following equipment at home: None  PLOF: Independent  PATIENT GOALS:  Get out of pain.   OBJECTIVE:   DIAGNOSTIC FINDINGS:  Questionable compression fractures (L1-L2), chronic degenerative disc L5-S1.  PATIENT SURVEYS:  FOTO Complete.  POSTURE:  Flexed posture and lack of lordosis due to pain.  PALPATION: Tender to palpation  at L5-S1, bilateral SIJ's and sacrum.  LUMBAR ROM:   Full active lumbar flexion and extension to 15 degrees.   LOWER EXTREMITY MMT:    Normal LE strength.  LUMBAR SPECIAL TESTS:  Equal leg lengths. (-) SLR testing. (+) FABER testing.   2+/4+ bilateral Patellar reflexes.  1+/4+ bilateral Achilles reflexes.   GAIT: Antalgic gait walking in a flexed trunk posture.   TODAY'S TREATMENT  HMP and IFC at 80-150 Hz on 40% scan x 20 minutes.  Patient tolerated treatment well with normal modality response following removal of modality.   ASSESSMENT:  CLINICAL IMPRESSION: The patient presents to [facility] with c/o low back pain since July of this year.  Her pain increases to high levels with prolonged standing and bending. She has an antalgic gait pattern and walk in a flexed trunk posture.  She is tender to palpation over her L5-S1, SIJ and sacral region.  She demonstrated a positive right FABER test.  She has normal bilateral LE strength.  Patient will benefit from skilled physical therapy intervention to address pain and deficits. OBJECTIVE IMPAIRMENTS Abnormal gait, decreased activity tolerance, decreased ROM, postural dysfunction, and pain.   ACTIVITY LIMITATIONS carrying, lifting, bending, and standing  PARTICIPATION LIMITATIONS: cleaning, laundry, and yard work  [employer] POTENTIAL: Excellent  CLINICAL DECISION MAKING: Stable/uncomplicated  EVALUATION COMPLEXITY: Low   GOALS: Goals reviewed with patient? Yes  SHORT TERM GOALS: Target date: 08/14/2022  Ind with a HEP. Baseline: Goal status: INITIAL   LONG TERM GOALS: Target date: 08/28/2022  Perform ADL's with pain not > 2-3/10. Baseline:  Goal status: INITIAL  2.  Stand 20 minutes with pain  not > 2/10. Baseline:  Goal status: INITIAL   PLAN: PT FREQUENCY: 3x/week  PT DURATION: 4 weeks  PLANNED INTERVENTIONS: Therapeutic exercises, Therapeutic activity, Patient/Family education, Self Care, Dry Needling, Electrical  stimulation, Cryotherapy, Moist heat, Ultrasound, and Manual therapy.  PLAN FOR NEXT SESSION: Combo e'stim/US, STW/M, core exercise progression.   Wilver Tignor, [name], PT 07/31/2022, 11:31 AM

## 2022-08-02 ENCOUNTER — Ambulatory Visit: Payer: Federal, State, Local not specified - PPO

## 2022-08-02 DIAGNOSIS — M5459 Other low back pain: Secondary | ICD-10-CM | POA: Diagnosis not present

## 2022-08-02 NOTE — Therapy (Signed)
OUTPATIENT PHYSICAL THERAPY THORACOLUMBAR EVALUATION   Patient Name: Whitney Peters MRN: [id number] DOB:April 09, 1961, 61 y.o., female Today's Date: 08/02/2022   PT End of Session - 08/02/22 0952     Visit Number 2    Number of Visits 12    Date for PT Re-Evaluation 08/28/22    Authorization Type FOTO AT LEAST EVERY 5TH VISIT.  PROGRESS NOTE AT 10TH VISIT.  KX MODIFIER AFTER 15 VISITS.    PT Start Time 0945    PT Stop Time 1043    PT Time Calculation (min) 58 min    Activity Tolerance Patient tolerated treatment well    Behavior During Therapy WFL for tasks assessed/performed             Past Medical History:  Diagnosis Date   Allergy    seasonal   Anticoagulated on warfarin    off since 2015   Arthritis    ankle- left    Asthma    no problems in years, rarely uses inhaler   Clotting disorder (HCC)    DVT,PE   DVT (deep venous thrombosis) (HCC)    left leg in 2014;  no DVT now     Family history of breast cancer    GERD (gastroesophageal reflux disease)    Heart murmur    pt denies   Hyperlipidemia    PE (pulmonary embolism) 04/2014   gone    Ruptured or perforated eardrum, bilateral    Uses hearing aid    bilateral   Wears partial dentures    upper dentures and lower partial   Past Surgical History:  Procedure Laterality Date   33 HOUR [place] STUDY N/A 01/02/2018   Procedure: 24 HOUR PH STUDY;  Surgeon: [name], MD;  Location: [facility];  Service: Endoscopy;  Laterality: N/A;   ABDOMINAL HYSTERECTOMY     APPENDECTOMY     COLONOSCOPY     ESOPHAGEAL MANOMETRY N/A 01/02/2018   Procedure: ESOPHAGEAL MANOMETRY (EM);  Surgeon: [name], MD;  Location: [facility];  Service: Endoscopy;  Laterality: N/A;   ESOPHAGOGASTRODUODENOSCOPY (EGD) WITH ESOPHAGEAL DILATION     x6   FOOT SURGERY Bilateral    BONE SPURS   KNEE SURGERY Right 2011   arthroscopy ; in [place]  93/8101   OPEN REDUCTION INTERNAL FIXATION  (ORIF) DISTAL RADIAL FRACTURE Right 12/02/2014   Procedure: OPEN REDUCTION INTERNAL FIXATION (ORIF) DISTAL RADIAL FRACTURE;  Surgeon: [name], MD;  Location: [place];  Service: Orthopedics;  Laterality: Right;   UPPER GASTROINTESTINAL ENDOSCOPY     WRIST SURGERY     "broke radius bone and theres plates and screws there now ;  Dr Lorin Mercy did"      REFERRING PROVIDER: Melina Schools MD  REFERRING DIAG: Vertebrogenic low back pain.  Rationale for Evaluation and Treatment Rehabilitation  THERAPY DIAG:  Other low back pain  ONSET DATE: July 2013.  SUBJECTIVE:  SUBJECTIVE STATEMENT: Pt arrives for today's treatment session reporting decreased low back.   PERTINENT HISTORY:  Asthma, PE (04/2014).  PAIN:  Are you having pain? Yes: NPRS scale: 4/10 Pain location: L-S region. Pain description: As above. Aggravating factors: As above. Relieving factors: As above.   PRECAUTIONS: None  WEIGHT BEARING RESTRICTIONS No  FALLS:  Has patient fallen in last 6 months? No  LIVING ENVIRONMENT: Lives with: lives with their spouse Lives in: House/apartment Has following equipment at home: None  PLOF: Independent  PATIENT GOALS:  Get out of pain.   OBJECTIVE:   DIAGNOSTIC FINDINGS:  Questionable compression fractures (L1-L2), chronic degenerative disc L5-S1.  PATIENT SURVEYS:  FOTO Complete.  POSTURE:  Flexed posture and lack of lordosis due to pain.  PALPATION: Tender to palpation at L5-S1, bilateral SIJ's and sacrum.  LUMBAR ROM:   Full active lumbar flexion and extension to 15 degrees.   LOWER EXTREMITY MMT:    Normal LE strength.  LUMBAR SPECIAL TESTS:  Equal leg lengths. (-) SLR testing. (+) FABER testing.   2+/4+ bilateral Patellar reflexes.  1+/4+ bilateral Achilles  reflexes.   GAIT: Antalgic gait walking in a flexed trunk posture.   TODAY'S TREATMENT                                     EXERCISE LOG  Exercise Repetitions and Resistance Comments  Nustep Lvl 3 x 15 mins            Blank cell = exercise not performed today   Manual Therapy Soft Tissue Mobilization: lumbar spine, STW/M to bil lumbar paraspinals to decrease pain and tone with pt positioned in right side-lying with pillow between her knees    Modalities  Date:  Unattended Estim: Lumbar, IFC 80-150 Hz, 15 mins, Pain and Tone Hot Pack: Lumbar, 15 mins, Pain and Tone  ASSESSMENT:  CLINICAL IMPRESSION: Pt arrives for today's treatment session reporting 4/10 low back pain.  Pt able to tolerate Nustep for warm-up today without issue or complaint of pain.  STW/M performed to bil lumbar paraspinals to decrease pain and tone.  Pt positioned in right side-lying for comfort with pillow between her knees.  Normal responses to estim and MH noted upon removal.  Pt reported 2/10 low back pain at completion of today's treatment session.   OBJECTIVE IMPAIRMENTS Abnormal gait, decreased activity tolerance, decreased ROM, postural dysfunction, and pain.   ACTIVITY LIMITATIONS carrying, lifting, bending, and standing  PARTICIPATION LIMITATIONS: cleaning, laundry, and yard work  [facility] POTENTIAL: Excellent  CLINICAL DECISION MAKING: Stable/uncomplicated  EVALUATION COMPLEXITY: Low   GOALS: Goals reviewed with patient? Yes  SHORT TERM GOALS: Target date: 08/16/2022  Ind with a HEP. Baseline: Goal status: INITIAL   LONG TERM GOALS: Target date: 08/30/2022  Perform ADL's with pain not > 2-3/10. Baseline:  Goal status: INITIAL  2.  Stand 20 minutes with pain  not > 2/10. Baseline:  Goal status: INITIAL   PLAN: PT FREQUENCY: 3x/week  PT DURATION: 4 weeks  PLANNED INTERVENTIONS: Therapeutic exercises, Therapeutic activity, Patient/Family education, Self Care, Dry Needling,  Electrical stimulation, Cryotherapy, Moist heat, Ultrasound, and Manual therapy.  PLAN FOR NEXT SESSION: Combo e'stim/US, STW/M, core exercise progression.   Newman Pies, PTA 08/02/2022, 10:45 AM

## 2022-08-07 ENCOUNTER — Ambulatory Visit: Payer: Federal, State, Local not specified - PPO | Attending: Orthopedic Surgery | Admitting: Physical Therapy

## 2022-08-07 ENCOUNTER — Encounter: Payer: Self-pay | Admitting: Physical Therapy

## 2022-08-07 DIAGNOSIS — M5459 Other low back pain: Secondary | ICD-10-CM | POA: Diagnosis not present

## 2022-08-07 NOTE — Therapy (Signed)
OUTPATIENT PHYSICAL THERAPY THORACOLUMBAR EVALUATION   Patient Name: Whitney Peters MRN: [id number] DOB:1961-07-10, 61 y.o., female Today's Date: 08/07/2022   PT End of Session - 08/07/22 1159     Visit Number 3    Number of Visits 12    Date for PT Re-Evaluation 08/28/22    Authorization Type FOTO AT LEAST EVERY 5TH VISIT.  PROGRESS NOTE AT 10TH VISIT.  KX MODIFIER AFTER 15 VISITS.    PT Start Time 1030    PT Stop Time 1122    PT Time Calculation (min) 52 min    Activity Tolerance Patient tolerated treatment well    Behavior During Therapy WFL for tasks assessed/performed             Past Medical History:  Diagnosis Date   Allergy    seasonal   Anticoagulated on warfarin    off since 2015   Arthritis    ankle- left    Asthma    no problems in years, rarely uses inhaler   Clotting disorder (HCC)    DVT,PE   DVT (deep venous thrombosis) (HCC)    left leg in 2014;  no DVT now     Family history of breast cancer    GERD (gastroesophageal reflux disease)    Heart murmur    pt denies   Hyperlipidemia    PE (pulmonary embolism) 04/2014   gone    Ruptured or perforated eardrum, bilateral    Uses hearing aid    bilateral   Wears partial dentures    upper dentures and lower partial   Past Surgical History:  Procedure Laterality Date   64 HOUR [place] STUDY N/A 01/02/2018   Procedure: 24 HOUR PH STUDY;  Surgeon: [name], MD;  Location: [facility];  Service: Endoscopy;  Laterality: N/A;   ABDOMINAL HYSTERECTOMY     APPENDECTOMY     COLONOSCOPY     ESOPHAGEAL MANOMETRY N/A 01/02/2018   Procedure: ESOPHAGEAL MANOMETRY (EM);  Surgeon: [name], MD;  [facility]: [facility];  Service: Endoscopy;  Laterality: N/A;   ESOPHAGOGASTRODUODENOSCOPY (EGD) WITH ESOPHAGEAL DILATION     x6   FOOT SURGERY Bilateral    BONE SPURS   KNEE SURGERY Right 2011   arthroscopy ; in [place]  33/8250   OPEN REDUCTION INTERNAL FIXATION  (ORIF) DISTAL RADIAL FRACTURE Right 12/02/2014   Procedure: OPEN REDUCTION INTERNAL FIXATION (ORIF) DISTAL RADIAL FRACTURE;  Surgeon: [name], MD;  Location: [place];  Service: Orthopedics;  Laterality: Right;   UPPER GASTROINTESTINAL ENDOSCOPY     WRIST SURGERY     "broke radius bone and theres plates and screws there now ;  Dr Lorin Mercy did"      REFERRING PROVIDER: Melina Schools MD  REFERRING DIAG: Vertebrogenic low back pain.  Rationale for Evaluation and Treatment Rehabilitation  THERAPY DIAG:  Other low back pain  ONSET DATE: July 2013.  SUBJECTIVE:  SUBJECTIVE STATEMENT: Long drive back and forth to [place].  Pain at a 6 today.  PERTINENT HISTORY:  Asthma, PE (04/2014).  PAIN:  Are you having pain? Yes: NPRS scale: 6/10 Pain location: L-S region. Pain description: As above. Aggravating factors: As above. Relieving factors: As above.  PATIENT GOALS:  Get out of pain.   OBJECTIVE:   TODAY'S TREATMENT                                     EXERCISE LOG  Exercise Repetitions and Resistance Comments  Nustep Lvl 3 x 15 mins            Blank cell = exercise not performed today   Manual Therapy Soft Tissue Mobilization: lumbar spine, STW/M to bil lumbar paraspinals to decrease pain and tone with pt positioned in left side-lying with pillow between her knees  x 8 minutes.  Modalities  Date:  Unattended Estim: Lumbar, IFC 80-150 Hz, 20 mins, Pain and Tone Hot Pack: Lumbar, 20 mins, Pain and Tone  ASSESSMENT:  CLINICAL IMPRESSION: Patient with increased pain today due to a long drive.  She was particularly tender at L5-S1.  She did well with treatment today and felt better following with normal modality respone following removal of modality.   GOALS: Goals reviewed with patient?  Yes  SHORT TERM GOALS: Target date: 08/21/2022  Ind with a HEP. Baseline: Goal status: INITIAL   LONG TERM GOALS: Target date: 09/04/2022  Perform ADL's with pain not > 2-3/10. Baseline:  Goal status: INITIAL  2.  Stand 20 minutes with pain  not > 2/10. Baseline:  Goal status: INITIAL   PLAN: PT FREQUENCY: 3x/week  PT DURATION: 4 weeks  PLANNED INTERVENTIONS: Therapeutic exercises, Therapeutic activity, Patient/Family education, Self Care, Dry Needling, Electrical stimulation, Cryotherapy, Moist heat, Ultrasound, and Manual therapy.  PLAN FOR NEXT SESSION: Combo e'stim/US, STW/M, core exercise progression.   [name], [name], PT 08/07/2022, 12:00 PM

## 2022-08-08 DIAGNOSIS — M5451 Vertebrogenic low back pain: Secondary | ICD-10-CM | POA: Diagnosis not present

## 2022-08-09 ENCOUNTER — Ambulatory Visit: Payer: Federal, State, Local not specified - PPO

## 2022-08-09 DIAGNOSIS — M5459 Other low back pain: Secondary | ICD-10-CM | POA: Diagnosis not present

## 2022-08-09 NOTE — Therapy (Signed)
OUTPATIENT PHYSICAL THERAPY THORACOLUMBAR TREATMENT   Patient Name: Whitney Peters MRN: [id number] DOB:05/05/1961, 61 y.o., female Today's Date: 08/09/2022   PT End of Session - 08/09/22 0903     Visit Number 4    Number of Visits 12    Date for PT Re-Evaluation 08/28/22    Authorization Type FOTO AT LEAST EVERY 5TH VISIT.  PROGRESS NOTE AT 10TH VISIT.  KX MODIFIER AFTER 15 VISITS.    PT Start Time 0900    PT Stop Time 0954    PT Time Calculation (min) 54 min    Activity Tolerance Patient tolerated treatment well    Behavior During Therapy WFL for tasks assessed/performed             Past Medical History:  Diagnosis Date   Allergy    seasonal   Anticoagulated on warfarin    off since 2015   Arthritis    ankle- left    Asthma    no problems in years, rarely uses inhaler   Clotting disorder (HCC)    DVT,PE   DVT (deep venous thrombosis) (HCC)    left leg in 2014;  no DVT now     Family history of breast cancer    GERD (gastroesophageal reflux disease)    Heart murmur    pt denies   Hyperlipidemia    PE (pulmonary embolism) 04/2014   gone    Ruptured or perforated eardrum, bilateral    Uses hearing aid    bilateral   Wears partial dentures    upper dentures and lower partial   Past Surgical History:  Procedure Laterality Date   24 HOUR PH STUDY N/A 01/02/2018   Procedure: 24 HOUR PH STUDY;  Surgeon: Nandigam, Kavitha V, MD;  [facility]: [facility];  Service: Endoscopy;  Laterality: N/A;   ABDOMINAL HYSTERECTOMY     APPENDECTOMY     COLONOSCOPY     ESOPHAGEAL MANOMETRY N/A 01/02/2018   Procedure: ESOPHAGEAL MANOMETRY (EM);  Surgeon: Nandigam, Kavitha V, MD;  [facility]: [facility];  Service: Endoscopy;  Laterality: N/A;   ESOPHAGOGASTRODUODENOSCOPY (EGD) WITH ESOPHAGEAL DILATION     x6   FOOT SURGERY Bilateral    BONE SPURS   KNEE SURGERY Right 2011   arthroscopy ; in [place]    NISSEN FUNDOPLICATION  03/2018   OPEN REDUCTION INTERNAL FIXATION  (ORIF) DISTAL RADIAL FRACTURE Right 12/02/2014   Procedure: OPEN REDUCTION INTERNAL FIXATION (ORIF) DISTAL RADIAL FRACTURE;  Surgeon: Mark C Yates, MD;  Location: [facility];  Service: Orthopedics;  Laterality: Right;   UPPER GASTROINTESTINAL ENDOSCOPY     WRIST SURGERY     "broke radius bone and theres plates and screws there now ;  Dr Yates did"      REFERRING PROVIDER: Dahari Brooks MD  REFERRING DIAG: Vertebrogenic low back pain.  Rationale for Evaluation and Treatment Rehabilitation  THERAPY DIAG:  Other low back pain  ONSET DATE: July 2013.  SUBJECTIVE:                                                                                                                                                                                             SUBJECTIVE STATEMENT: Pt reports having to sleep on air mattress which is causing increased low back soreness.  PERTINENT HISTORY:  Asthma, PE (04/2014).  PAIN:  Are you having pain? Yes: NPRS scale: 7/10 Pain location: L-S region. Pain description: As above. Aggravating factors: As above. Relieving factors: As above.  PATIENT GOALS:  Get out of pain.   OBJECTIVE:   TODAY'S TREATMENT                                     EXERCISE LOG  Exercise Repetitions and Resistance Comments  Nustep Lvl 3 x 15 mins            Blank cell = exercise not performed today   Manual Therapy Soft Tissue Mobilization: lumbar spine, STW/M to bil lumbar paraspinals to decrease pain and tone with pt positioned in left side-lying with pillow between her knees    Modalities  Date:  Unattended Estim: Lumbar, IFC 80-150 Hz, 10 mins, Pain and Tone Hot Pack: Lumbar, 10 mins, Pain and Tone  ASSESSMENT:  CLINICAL IMPRESSION: Pt arrives for today's treatment session reporting 7/10 low back pain.  Pt reports having to sleep on an air mattress due to moving and it has caused increased soreness.  STW/M performed to B paraspinals to decrease pain and tone with pt  positioned in left side-lying.  Normal responses to estim and MH noted upon removal.  Pt reported 3/10 low back pain at completion of today's treatment session.    GOALS: Goals reviewed with patient? Yes  SHORT TERM GOALS: Target date: 08/23/2022  Ind with a HEP. Baseline: Goal status: MET   LONG TERM GOALS: Target date: 09/06/2022  Perform ADL's with pain not > 2-3/10. Baseline:  Goal status: INITIAL  2.  Stand 20 minutes with pain  not > 2/10. Baseline:  Goal status: INITIAL   PLAN: PT FREQUENCY: 3x/week  PT DURATION: 4 weeks  PLANNED INTERVENTIONS: Therapeutic exercises, Therapeutic activity, Patient/Family education, Self Care, Dry Needling, Electrical stimulation, Cryotherapy, Moist heat, Ultrasound, and Manual therapy.  PLAN FOR NEXT SESSION: Combo e'stim/US, STW/M, core exercise progression.   Jennifer A Rogers, PTA 08/09/2022, 9:54 AM  

## 2022-08-11 ENCOUNTER — Ambulatory Visit: Payer: Federal, State, Local not specified - PPO | Admitting: Physical Therapy

## 2022-08-11 DIAGNOSIS — M5459 Other low back pain: Secondary | ICD-10-CM

## 2022-08-11 NOTE — Therapy (Signed)
OUTPATIENT PHYSICAL THERAPY THORACOLUMBAR TREATMENT   Patient Name: Whitney Peters MRN: [id number] DOB:Jun 25, 1961, 61 y.o., female Today's Date: 08/11/2022   PT End of Session - 08/11/22 1006     Visit Number 5    Number of Visits 12    Date for PT Re-Evaluation 08/28/22    Authorization Type FOTO AT LEAST EVERY 5TH VISIT.  PROGRESS NOTE AT 10TH VISIT.  KX MODIFIER AFTER 15 VISITS.    PT Start Time 0945    PT Stop Time 7322    PT Time Calculation (min) 56 min    Activity Tolerance Patient tolerated treatment well    Behavior During Therapy WFL for tasks assessed/performed             Past Medical History:  Diagnosis Date   Allergy    seasonal   Anticoagulated on warfarin    off since 2015   Arthritis    ankle- left    Asthma    no problems in years, rarely uses inhaler   Clotting disorder (HCC)    DVT,PE   DVT (deep venous thrombosis) (HCC)    left leg in 2014;  no DVT now     Family history of breast cancer    GERD (gastroesophageal reflux disease)    Heart murmur    pt denies   Hyperlipidemia    PE (pulmonary embolism) 04/2014   gone    Ruptured or perforated eardrum, bilateral    Uses hearing aid    bilateral   Wears partial dentures    upper dentures and lower partial   Past Surgical History:  Procedure Laterality Date   50 HOUR [place] STUDY N/A 01/02/2018   Procedure: 24 HOUR PH STUDY;  Surgeon: [name], MD;  [facility];  Service: Endoscopy;  Laterality: N/A;   ABDOMINAL HYSTERECTOMY     APPENDECTOMY     COLONOSCOPY     ESOPHAGEAL MANOMETRY N/A 01/02/2018   Procedure: ESOPHAGEAL MANOMETRY (EM);  Surgeon: [name], MD;  [facility];  Service: Endoscopy;  Laterality: N/A;   ESOPHAGOGASTRODUODENOSCOPY (EGD) WITH ESOPHAGEAL DILATION     x6   FOOT SURGERY Bilateral    BONE SPURS   KNEE SURGERY Right 2011   arthroscopy ; in [place]  12/5425   OPEN REDUCTION INTERNAL FIXATION  (ORIF) DISTAL RADIAL FRACTURE Right 12/02/2014   Procedure: OPEN REDUCTION INTERNAL FIXATION (ORIF) DISTAL RADIAL FRACTURE;  Surgeon: [name], MD;  Location: [place];  Service: Orthopedics;  Laterality: Right;   UPPER GASTROINTESTINAL ENDOSCOPY     WRIST SURGERY     "broke radius bone and theres plates and screws there now ;  Dr Lorin Mercy did"      REFERRING PROVIDER: Melina Schools MD  REFERRING DIAG: Vertebrogenic low back pain.  Rationale for Evaluation and Treatment Rehabilitation  THERAPY DIAG:  Other low back pain  ONSET DATE: July 2013.  SUBJECTIVE:  SUBJECTIVE STATEMENT: Pain at a 5/10.  PERTINENT HISTORY:  Asthma, PE (04/2014).  PAIN:  Are you having pain? Yes: NPRS scale: 5/10 Pain location: L-S region. Pain description: As above. Aggravating factors: As above. Relieving factors: As above.  PATIENT GOALS:  Get out of pain.   OBJECTIVE:   TODAY'S TREATMENT                                     EXERCISE LOG  Exercise Repetitions and Resistance Comments  Nustep Lvl 4 x 17 miutes   Back ext 60#  X 3 minutes.   Ab machine 60# X 3 minutes    Blank cell = exercise not performed today   Manual Therapy Soft Tissue Mobilization: lumbar spine, STW/M x 7 minutes to bil lumbar paraspinals to decrease pain and tone with pt positioned in left side-lying with pillow between her knees    Modalities  Date:  Unattended Estim: Lumbar, IFC 80-150 Hz, 20 mins, Pain and Tone Hot Pack: Lumbar, 10 mins, Pain and Tone  ASSESSMENT:  CLINICAL IMPRESSION: Pain lower today.  Added back and ab machine which patient performed with excellent technique an no complaints.  GOALS: Goals reviewed with patient? Yes  SHORT TERM GOALS: Target date: 08/23/2022  Ind with a HEP. Baseline: Goal status:  MET   LONG TERM GOALS: Target date: 09/06/2022  Perform ADL's with pain not > 2-3/10. Baseline:  Goal status: INITIAL  2.  Stand 20 minutes with pain  not > 2/10. Baseline:  Goal status: INITIAL   PLAN: PT FREQUENCY: 3x/week  PT DURATION: 4 weeks  PLANNED INTERVENTIONS: Therapeutic exercises, Therapeutic activity, Patient/Family education, Self Care, Dry Needling, Electrical stimulation, Cryotherapy, Moist heat, Ultrasound, and Manual therapy.  PLAN FOR NEXT SESSION: Combo e'stim/US, STW/M, core exercise progression.   Joei Frangos, [name], PT 08/11/2022, 11:08 AM

## 2022-08-15 ENCOUNTER — Ambulatory Visit: Payer: Federal, State, Local not specified - PPO | Admitting: Physical Therapy

## 2022-08-15 ENCOUNTER — Other Ambulatory Visit: Payer: Self-pay

## 2022-08-15 ENCOUNTER — Emergency Department (HOSPITAL_COMMUNITY)
Admission: EM | Admit: 2022-08-15 | Discharge: 2022-08-15 | Disposition: A | Discharge status: Home / Self Care | Payer: Federal, State, Local not specified - PPO | Attending: Emergency Medicine | Admitting: Emergency Medicine

## 2022-08-15 ENCOUNTER — Emergency Department (HOSPITAL_COMMUNITY): Payer: Federal, State, Local not specified - PPO

## 2022-08-15 ENCOUNTER — Encounter (HOSPITAL_COMMUNITY): Payer: Self-pay

## 2022-08-15 DIAGNOSIS — Z7901 Long term (current) use of anticoagulants: Secondary | ICD-10-CM | POA: Insufficient documentation

## 2022-08-15 DIAGNOSIS — K8020 Calculus of gallbladder without cholecystitis without obstruction: Secondary | ICD-10-CM | POA: Insufficient documentation

## 2022-08-15 DIAGNOSIS — J45909 Unspecified asthma, uncomplicated: Secondary | ICD-10-CM | POA: Insufficient documentation

## 2022-08-15 DIAGNOSIS — N200 Calculus of kidney: Secondary | ICD-10-CM

## 2022-08-15 DIAGNOSIS — R109 Unspecified abdominal pain: Secondary | ICD-10-CM | POA: Diagnosis not present

## 2022-08-15 DIAGNOSIS — K828 Other specified diseases of gallbladder: Secondary | ICD-10-CM | POA: Diagnosis not present

## 2022-08-15 DIAGNOSIS — N132 Hydronephrosis with renal and ureteral calculous obstruction: Secondary | ICD-10-CM | POA: Diagnosis not present

## 2022-08-15 DIAGNOSIS — K5730 Diverticulosis of large intestine without perforation or abscess without bleeding: Secondary | ICD-10-CM | POA: Diagnosis not present

## 2022-08-15 LAB — URINALYSIS, ROUTINE W REFLEX MICROSCOPIC
Bilirubin Urine: NEGATIVE
Glucose, UA: NEGATIVE mg/dL
Ketones, ur: NEGATIVE mg/dL
Leukocytes,Ua: NEGATIVE
Nitrite: NEGATIVE
Protein, ur: NEGATIVE mg/dL
RBC / HPF: >50 RBC/hpf — ABNORMAL HIGH (ref 0–5)
Specific Gravity, Urine: 1.005 (ref 1.005–1.030)
pH: 6 (ref 5.0–8.0)

## 2022-08-15 LAB — COMPREHENSIVE METABOLIC PANEL
ALT: 18 U/L (ref 0–44)
AST: 21 U/L (ref 15–41)
Albumin: 3.8 g/dL (ref 3.5–5.0)
Alkaline Phosphatase: 122 U/L (ref 38–126)
Anion gap: 7 (ref 5–15)
BUN: 9 mg/dL (ref 8–23)
CO2: 23 mmol/L (ref 22–32)
Calcium: 8.6 mg/dL — ABNORMAL LOW (ref 8.9–10.3)
Chloride: 108 mmol/L (ref 98–111)
Creatinine, Ser: 0.75 mg/dL (ref 0.44–1.00)
GFR, Estimated: >60 mL/min (ref >60)
Glucose, Bld: 94 mg/dL (ref 70–99)
Potassium: 3.6 mmol/L (ref 3.5–5.1)
Sodium: 138 mmol/L (ref 135–145)
Total Bilirubin: 1 mg/dL (ref 0.3–1.2)
Total Protein: 7.2 g/dL (ref 6.5–8.1)

## 2022-08-15 LAB — CBC WITH DIFFERENTIAL/PLATELET
Abs Immature Granulocytes: 0.08 10*3/uL — ABNORMAL HIGH (ref 0.00–0.07)
Basophils Absolute: 0 10*3/uL (ref 0.0–0.1)
Basophils Relative: 1 %
Eosinophils Absolute: 0.1 10*3/uL (ref 0.0–0.5)
Eosinophils Relative: 1 %
HCT: 33.9 % — ABNORMAL LOW (ref 36.0–46.0)
Hemoglobin: 11 g/dL — ABNORMAL LOW (ref 12.0–15.0)
Immature Granulocytes: 2 %
Lymphocytes Relative: 16 %
Lymphs Abs: 0.8 10*3/uL (ref 0.7–4.0)
MCH: 29.6 pg (ref 26.0–34.0)
MCHC: 32.4 g/dL (ref 30.0–36.0)
MCV: 91.1 fL (ref 80.0–100.0)
Monocytes Absolute: 0.3 10*3/uL (ref 0.1–1.0)
Monocytes Relative: 7 %
Neutro Abs: 3.5 10*3/uL (ref 1.7–7.7)
Neutrophils Relative %: 73 %
Platelets: 111 10*3/uL — ABNORMAL LOW (ref 150–400)
RBC: 3.72 MIL/uL — ABNORMAL LOW (ref 3.87–5.11)
RDW: 14.8 % (ref 11.5–15.5)
WBC: 4.7 10*3/uL (ref 4.0–10.5)
nRBC: 0 % (ref 0.0–0.2)

## 2022-08-15 MED ORDER — HYDROMORPHONE HCL 1 MG/ML IJ SOLN
1.0000 mg | Freq: Once | INTRAMUSCULAR | Status: AC
  Administered 2022-08-15: 1 mg via INTRAVENOUS
  Filled 2022-08-15: qty 1

## 2022-08-15 MED ORDER — ONDANSETRON HCL 4 MG/2ML IJ SOLN
4.0000 mg | Freq: Once | INTRAMUSCULAR | Status: AC
  Administered 2022-08-15: 4 mg via INTRAVENOUS
  Filled 2022-08-15: qty 2

## 2022-08-15 MED ORDER — TAMSULOSIN HCL 0.4 MG PO CAPS: 0.4000 mg | ORAL_CAPSULE | Freq: Every day | ORAL | 0 refills | Status: AC

## 2022-08-15 MED ORDER — OXYCODONE-ACETAMINOPHEN 5-325 MG PO TABS: 1.0000 | ORAL_TABLET | ORAL | 0 refills | Status: DC | PRN

## 2022-08-15 MED ORDER — ONDANSETRON HCL 4 MG PO TABS: 4.0000 mg | ORAL_TABLET | Freq: Four times a day (QID) | ORAL | 0 refills | Status: AC

## 2022-08-15 MED ORDER — OXYCODONE-ACETAMINOPHEN 5-325 MG PO TABS: 1.0000 | ORAL_TABLET | Freq: Four times a day (QID) | ORAL | 0 refills | Status: DC | PRN

## 2022-08-15 NOTE — ED Provider Notes (Signed)
[facility] EMERGENCY DEPARTMENT Provider Note   CSN: [id number] Arrival date & time: 08/15/22  1610     History  Chief Complaint  Patient presents with   Back Pain    Whitney Peters is a 61 y.o. female.   Back Pain Associated symptoms: abdominal pain   Associated symptoms: no chest pain, no dysuria, no fever, no numbness and no weakness       Whitney Peters is a 61 y.o. 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  Pain has been associated with vomiting.  She describes sharp pain to her flank that radiates to her right lower abdomen.  Pain is also been associated with some dysuria and urinary hesitancy.  No history of prior kidney stones.  She denies any fever or chills, chest pain or upper abdominal pain.  History of prior appendectomy.  Home Medications Prior to Admission medications   Medication Sig Start Date End Date Taking? Authorizing Provider  acyclovir ointment (ZOVIRAX) 5 % Apply topically 3 (three) times daily as needed. 02/16/22   Myna Hidalgo, DO  Cholecalciferol (VITAMIN D3) 125 MCG (5000 UT) CAPS Take by mouth. Patient not taking: Reported on 07/31/2022    [provider]  ELIQUIS 5 MG TABS tablet TAKE ONE TABLET BY MOUTH TWICE DAILY 04/04/22   Doreatha Massed, MD  lidocaine (XYLOCAINE) 5 % ointment Apply 1 application. topically as needed. 02/16/22   Myna Hidalgo, DO      Allergies    Rivaroxaban, Chocolate, Coumadin [warfarin], Dabigatran etexilate mesylate, Lactose intolerance (gi), Meat [alpha-gal], Other, and Pradaxa [dabigatran etexilate mesylate]    Review of Systems   Review of Systems  Constitutional:  Negative for fever.  Respiratory:  Negative for shortness of breath.   Cardiovascular:  Negative for chest pain.  Gastrointestinal:  Positive for abdominal pain,  nausea and vomiting.  Genitourinary:  Positive for difficulty urinating and flank pain. Negative for dysuria and hematuria.  Musculoskeletal:  Positive for back pain.  Skin:  Negative for rash.  Neurological:  Negative for weakness and numbness.    Physical Exam Updated Vital Signs BP (!) 154/78 (BP Location: Right Arm)   Pulse 60   Temp 97.6 F (36.4 C) (Oral)   Resp 13   Ht 5\' 9"  (1.753 m)   Wt 77.1 kg   SpO2 98%   BMI 25.10 kg/m  Physical Exam Vitals and nursing note reviewed.  Constitutional:      General: She is not in acute distress.    Appearance: She is not ill-appearing or toxic-appearing.  Cardiovascular:     Rate and Rhythm: Normal rate and regular rhythm.     Pulses: Normal pulses.  Pulmonary:     Effort: Pulmonary effort is normal.     Breath sounds: Normal breath sounds.  Abdominal:     Palpations: Abdomen is soft.     Tenderness: There is no abdominal tenderness. There is no right CVA tenderness or left CVA tenderness.  Musculoskeletal:     Right lower leg: No edema.     Left lower leg: No edema.  Skin:    General: Skin is warm.     Capillary Refill: Capillary refill takes less than 2 seconds.     Findings: No rash.  Neurological:     General: No focal deficit present.     Mental Status: She is alert.  Sensory: No sensory deficit.     Motor: No weakness.    ED Results / Procedures / Treatments   Labs (all labs ordered are listed, but only abnormal results are displayed) Labs Reviewed  CBC WITH DIFFERENTIAL/PLATELET - Abnormal; Notable for the following components:      Result Value   RBC 3.72 (*)    Hemoglobin 11.0 (*)    HCT 33.9 (*)    Platelets 111 (*)    Abs Immature Granulocytes 0.08 (*)    All other components within normal limits  URINALYSIS, ROUTINE W REFLEX MICROSCOPIC - Abnormal; Notable for the following components:   APPearance HAZY (*)    Hgb urine dipstick LARGE (*)    RBC / HPF >50 (*)    Bacteria, UA RARE (*)    All  other components within normal limits  COMPREHENSIVE METABOLIC PANEL - Abnormal; Notable for the following components:   Calcium 8.6 (*)    All other components within normal limits    EKG None  Radiology CT Renal Stone Study  Result Date: 08/15/2022 CLINICAL DATA:  Right flank pain EXAM: CT ABDOMEN AND PELVIS WITHOUT CONTRAST TECHNIQUE: Multidetector CT imaging of the abdomen and pelvis was performed following the standard protocol without IV contrast. RADIATION DOSE REDUCTION: This exam was performed according to the departmental dose-optimization program which includes automated exposure control, adjustment of the mA and/or kV according to patient size and/or use of iterative reconstruction technique. COMPARISON:  08/06/2018 FINDINGS: Lower chest: There is no focal consolidation in the lower lung fields. Coronary artery calcifications are seen. Hepatobiliary: No focal abnormalities are seen in liver. There is no dilation of bile ducts. Gallbladder is distended. There is wall thickening in gallbladder. There is pericholecystic fluid. There is layering of sludge and tiny gallbladder stones. Pancreas: No focal abnormalities are seen. Spleen: Spleen measures 15 cm in maximum diameter. Adrenals/Urinary Tract: Adrenals are unremarkable. There is mild to moderate right hydronephrosis. Right ureter is dilated. There is 2 mm calculus in the distal right ureter close to the ureterovesical junction. There is no hydronephrosis in the left kidney. There are no renal stones. Urinary bladder is not distended. There is tiny pocket of air in the bladder, possibly due to recent instrumentation. Less likely possibility would be cystitis. Stomach/Bowel: Stomach is not distended. There is possible previous intervention such as fundoplication. Small bowel loops are unremarkable. Appendix is not seen. There is no significant wall thickening in colon. Scattered diverticula are seen in colon without signs of focal acute  diverticulitis. Vascular/Lymphatic: Scattered arterial calcifications are seen. 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 Reproductive: Uterus is not seen. Other: There is no pneumoperitoneum. Small amount of ascites is seen in right lower quadrant. Small umbilical hernia containing fat is seen. Musculoskeletal: Degenerative changes are noted in lumbar spine, more so at L5-S1 level. Degenerative changes are noted in the left both hips. IMPRESSION: There is mild-to-moderate right hydronephrosis. There is 2 mm calculus in the distal right ureter close to the ureterovesical junction causing partial obstruction. There is small pocket of air in the lumen of the urinary bladder, possibly due to recent catheterization. Less likely possibility would be cystitis. There is wall thickening in gallbladder. There is pericholecystic stranding. Layering stones are noted in the gallbladder. Findings suggest possible acute cholecystitis. Gallbladder sonogram as clinically warranted should be considered. Small amount of free fluid is seen in right lower quadrant of abdomen which may be related to acute cholecystitis. Enlarged spleen. There  is mild diffuse stranding in mesenteric fat suggesting acute or chronic mesenteritis. Diverticulosis of colon without signs of focal diverticulitis. Other findings as described in the body of the report. 6 Electronically Signed   By: Ernie Avena M.D.   On: 08/15/2022 17:29    Procedures Procedures    Medications Ordered in ED Medications  ondansetron (ZOFRAN) injection 4 mg (has no administration in time range)  HYDROmorphone (DILAUDID) injection 1 mg (has no administration in time range)    ED Course/ Medical Decision Making/ A&P                           Medical Decision Making Patient here for evaluation of sudden onset right flank pain and urinary hesitancy.  Symptoms began earlier today.  No history of prior kidney stones.  No known  injury  On exam, patient well-appearing nontoxic.  Vital signs reviewed.  No CVA tenderness on my exam.  Abdomen is soft without peritoneal signs.  Differential diagnosis would include but not limited to kidney stone, pyelonephritis, acute diverticulitis, acute appendicitis   Amount and/or Complexity of Data Reviewed Labs: ordered.    Details: Labs interpreted by me, no leukocytosis, chemistries unremarkable.  Transaminases unremarkable.  Urine hazy with hematuria.  No evidence of infection Radiology: ordered.    Details: CT renal stone study shows 2 mm stone in distal right ureter near UVJ with mild to moderate  hydronephrosis CT also showing thickening of the GB, with stranding and multiple stones suggestive of acute cholecystitis.   Discussion of management or test interpretation with external provider(s): Pt here with sudden onset of dysuria and right flank pain.    She does not have any tenderness of RUQ on my exam.  No fever and LFT's, bili unremarkable and she denies having abdominal pain with food intake or pain prior to today.  Clinically I do not feel pt's current sx's are related to acute cholecystitis given lack of associated sx's I also spoke with general surgery, Dr. Henreitta Leber.  Pt appropriate for d/c home and will closely f/u with surgery.  I also given pt strict ER return precautions  Pt also seen by Dr Rhunette Croft and care plan discussed.     Risk Prescription drug management.           Final Clinical Impression(s) / ED Diagnoses Final diagnoses:  Kidney stone on right side  Gall stones    Rx / DC Orders ED Discharge Orders     None         Pauline Aus, PA-C 08/16/22 2248    Derwood Kaplan, MD 08/24/22 1553

## 2022-08-15 NOTE — Discharge Instructions (Signed)
Your right-sided symptoms today or likely related to a right-sided kidney stone.  Kidney stone is small enough that she will likely pass this.  You have been prescribed pain medication and nausea medication to help with symptoms.  The CT today also shows that you have gallstones in your gallbladder.  I recommend that you contact the surgeon listed to arrange a follow-up appointment.  I have also listed a provider to follow-up with regarding the kidney stone.  return to the emergency department for any new or worsening symptoms.

## 2022-08-15 NOTE — ED Notes (Signed)
ED Provider at bedside. 

## 2022-08-15 NOTE — ED Notes (Signed)
Pt alert, oriented, and ambulatory upon DC. This RN gave patient percocet pre pack quantity of 6 prior to DC. Pt is aware that she will not have a script at pharmacy for percocet. She signed paper script that she received pre pack. She agrees to follow up with  urology and general surgery. She is leaves in the care of her husband. Bryson Corona Edd Fabian

## 2022-08-15 NOTE — ED Triage Notes (Signed)
Pt presents to ED with complaints of right sided back pain and difficulty urinating started today

## 2022-08-16 MED FILL — Oxycodone w/ Acetaminophen Tab 5-325 MG: ORAL | Qty: 6 | Status: AC

## 2022-08-18 ENCOUNTER — Encounter: Payer: Self-pay | Admitting: Urology

## 2022-08-18 ENCOUNTER — Emergency Department (HOSPITAL_COMMUNITY)
Admission: EM | Admit: 2022-08-18 | Discharge: 2022-08-18 | Disposition: A | Discharge status: Home / Self Care | Payer: Federal, State, Local not specified - PPO | Attending: Emergency Medicine | Admitting: Emergency Medicine

## 2022-08-18 ENCOUNTER — Encounter (HOSPITAL_COMMUNITY): Payer: Self-pay | Admitting: Emergency Medicine

## 2022-08-18 ENCOUNTER — Ambulatory Visit (INDEPENDENT_AMBULATORY_CARE_PROVIDER_SITE_OTHER): Payer: Federal, State, Local not specified - PPO | Admitting: Urology

## 2022-08-18 VITALS — BP 151/76 | HR 86

## 2022-08-18 DIAGNOSIS — Z7901 Long term (current) use of anticoagulants: Secondary | ICD-10-CM | POA: Insufficient documentation

## 2022-08-18 DIAGNOSIS — N23 Unspecified renal colic: Secondary | ICD-10-CM

## 2022-08-18 DIAGNOSIS — N200 Calculus of kidney: Secondary | ICD-10-CM

## 2022-08-18 DIAGNOSIS — N3000 Acute cystitis without hematuria: Secondary | ICD-10-CM

## 2022-08-18 DIAGNOSIS — H6692 Otitis media, unspecified, left ear: Secondary | ICD-10-CM | POA: Diagnosis not present

## 2022-08-18 DIAGNOSIS — R109 Unspecified abdominal pain: Secondary | ICD-10-CM | POA: Diagnosis not present

## 2022-08-18 LAB — POCT URINALYSIS DIPSTICK
Glucose, UA: NEGATIVE
Leukocytes, UA: NEGATIVE
Nitrite, UA: NEGATIVE
Protein, UA: POSITIVE — AB
Spec Grav, UA: 1.025 (ref 1.010–1.025)
Urobilinogen, UA: 1 E.U./dL
pH, UA: 5.5 (ref 5.0–8.0)

## 2022-08-18 LAB — COMPREHENSIVE METABOLIC PANEL
ALT: 15 U/L (ref 0–44)
AST: 18 U/L (ref 15–41)
Albumin: 3.3 g/dL — ABNORMAL LOW (ref 3.5–5.0)
Alkaline Phosphatase: 106 U/L (ref 38–126)
Anion gap: 6 (ref 5–15)
BUN: 10 mg/dL (ref 8–23)
CO2: 25 mmol/L (ref 22–32)
Calcium: 8.6 mg/dL — ABNORMAL LOW (ref 8.9–10.3)
Chloride: 109 mmol/L (ref 98–111)
Creatinine, Ser: 0.85 mg/dL (ref 0.44–1.00)
GFR, Estimated: >60 mL/min (ref >60)
Glucose, Bld: 102 mg/dL — ABNORMAL HIGH (ref 70–99)
Potassium: 3.7 mmol/L (ref 3.5–5.1)
Sodium: 140 mmol/L (ref 135–145)
Total Bilirubin: 0.7 mg/dL (ref 0.3–1.2)
Total Protein: 6.2 g/dL — ABNORMAL LOW (ref 6.5–8.1)

## 2022-08-18 LAB — CBC WITH DIFFERENTIAL/PLATELET
Abs Immature Granulocytes: 0.01 10*3/uL (ref 0.00–0.07)
Basophils Absolute: 0 10*3/uL (ref 0.0–0.1)
Basophils Relative: 1 %
Eosinophils Absolute: 0.1 10*3/uL (ref 0.0–0.5)
Eosinophils Relative: 3 %
HCT: 30.7 % — ABNORMAL LOW (ref 36.0–46.0)
Hemoglobin: 10.1 g/dL — ABNORMAL LOW (ref 12.0–15.0)
Immature Granulocytes: 0 %
Lymphocytes Relative: 31 %
Lymphs Abs: 1 10*3/uL (ref 0.7–4.0)
MCH: 29.8 pg (ref 26.0–34.0)
MCHC: 32.9 g/dL (ref 30.0–36.0)
MCV: 90.6 fL (ref 80.0–100.0)
Monocytes Absolute: 0.4 10*3/uL (ref 0.1–1.0)
Monocytes Relative: 12 %
Neutro Abs: 1.7 10*3/uL (ref 1.7–7.7)
Neutrophils Relative %: 53 %
Platelets: 96 10*3/uL — ABNORMAL LOW (ref 150–400)
RBC: 3.39 MIL/uL — ABNORMAL LOW (ref 3.87–5.11)
RDW: 14.9 % (ref 11.5–15.5)
WBC: 3.3 10*3/uL — ABNORMAL LOW (ref 4.0–10.5)
nRBC: 0 % (ref 0.0–0.2)

## 2022-08-18 LAB — LIPASE, BLOOD: Lipase: 42 U/L (ref 11–51)

## 2022-08-18 MED ORDER — ONDANSETRON HCL 4 MG/2ML IJ SOLN
4.0000 mg | Freq: Once | INTRAMUSCULAR | Status: AC
  Administered 2022-08-18: 4 mg via INTRAVENOUS
  Filled 2022-08-18: qty 2

## 2022-08-18 MED ORDER — OXYCODONE-ACETAMINOPHEN 5-325 MG PO TABS: 1.0000 | ORAL_TABLET | ORAL | 0 refills | Status: AC | PRN

## 2022-08-18 MED ORDER — SULFAMETHOXAZOLE-TRIMETHOPRIM 800-160 MG PO TABS: 1.0000 | ORAL_TABLET | Freq: Two times a day (BID) | ORAL | 0 refills | Status: AC

## 2022-08-18 MED ORDER — HYDROMORPHONE HCL 2 MG PO TABS: 2.0000 mg | ORAL_TABLET | Freq: Four times a day (QID) | ORAL | 0 refills | Status: AC | PRN

## 2022-08-18 MED ORDER — HYDROMORPHONE HCL 1 MG/ML IJ SOLN
1.0000 mg | Freq: Once | INTRAMUSCULAR | Status: AC
  Administered 2022-08-18: 1 mg via INTRAVENOUS
  Filled 2022-08-18: qty 1

## 2022-08-18 MED ORDER — PROMETHAZINE HCL 12.5 MG PO TABS: 12.5000 mg | ORAL_TABLET | Freq: Four times a day (QID) | ORAL | 0 refills | Status: AC | PRN

## 2022-08-18 NOTE — ED Provider Notes (Signed)
[facility] EMERGENCY DEPARTMENT Provider Note   CSN: [id number] Arrival date & time: 08/18/22  0252     History  Chief Complaint  Patient presents with   Flank Pain         Whitney Peters is a 61 y.o. female.  Patient returns with increased right flank pain.  Patient reports that she was in this ED 2 days ago and was diagnosed with a kidney stone.  She had been doing well using the Percocet prescribed but tonight the pain became unbearable.       Home Medications Prior to Admission medications   Medication Sig Start Date End Date Taking? Authorizing Provider  oxyCODONE-acetaminophen (PERCOCET) 5-325 MG tablet Take 1-2 tablets by mouth every 4 (four) hours as needed. 08/18/22  Yes Londen Lorge, Canary Brim, MD  acyclovir ointment (ZOVIRAX) 5 % Apply topically 3 (three) times daily as needed. 02/16/22   Myna Hidalgo, DO  Cholecalciferol (VITAMIN D3) 125 MCG (5000 UT) CAPS Take by mouth. Patient not taking: Reported on 07/31/2022    [provider]  ELIQUIS 5 MG TABS tablet TAKE ONE TABLET BY MOUTH TWICE DAILY 04/04/22   Doreatha Massed, MD  lidocaine (XYLOCAINE) 5 % ointment Apply 1 application. topically as needed. 02/16/22   Myna Hidalgo, DO  ondansetron (ZOFRAN) 4 MG tablet Take 1 tablet (4 mg total) by mouth every 6 (six) hours. 08/15/22   Triplett, Tammy, PA-C  oxyCODONE-acetaminophen (PERCOCET/ROXICET) 5-325 MG tablet Take 1 tablet by mouth every 4 (four) hours as needed. 08/15/22   Triplett, Tammy, PA-C  tamsulosin (FLOMAX) 0.4 MG CAPS capsule Take 1 capsule (0.4 mg total) by mouth daily. 08/15/22   Triplett, Tammy, PA-C      Allergies    Rivaroxaban, Chocolate, Coumadin [warfarin], Dabigatran etexilate mesylate, Lactose intolerance (gi), Meat [alpha-gal], Other, and Pradaxa [dabigatran etexilate mesylate]    Review of Systems   Review of Systems  Physical Exam Updated Vital Signs BP 111/70   Pulse 70   Temp 98 F (36.7 C) (Oral)   Resp 17   Ht  5\' 9"  (1.753 m)   Wt 77.1 kg   SpO2 92%   BMI 25.10 kg/m  Physical Exam Vitals and nursing note reviewed.  Constitutional:      General: She is not in acute distress.    Appearance: She is well-developed.  HENT:     Head: Normocephalic and atraumatic.     Mouth/Throat:     Mouth: Mucous membranes are moist.  Eyes:     General: Vision grossly intact. Gaze aligned appropriately.     Extraocular Movements: Extraocular movements intact.     Conjunctiva/sclera: Conjunctivae normal.  Cardiovascular:     Rate and Rhythm: Normal rate and regular rhythm.     Pulses: Normal pulses.     Heart sounds: Normal heart sounds, S1 normal and S2 normal. No murmur heard.    No friction rub. No gallop.  Pulmonary:     Effort: Pulmonary effort is normal. No respiratory distress.     Breath sounds: Normal breath sounds.  Abdominal:     General: Bowel sounds are normal.     Palpations: Abdomen is soft.     Tenderness: There is no abdominal tenderness. There is no guarding or rebound.     Hernia: No hernia is present.  Musculoskeletal:        General: No swelling.     Cervical back: Full passive range of motion without pain, normal range of motion and neck supple. No  spinous process tenderness or muscular tenderness. Normal range of motion.     Right lower leg: No edema.     Left lower leg: No edema.  Skin:    General: Skin is warm and dry.     Capillary Refill: Capillary refill takes less than 2 seconds.     Findings: No ecchymosis, erythema, rash or wound.  Neurological:     General: No focal deficit present.     Mental Status: She is alert and oriented to person, place, and time.     GCS: GCS eye subscore is 4. GCS verbal subscore is 5. GCS motor subscore is 6.     Cranial Nerves: Cranial nerves 2-12 are intact.     Sensory: Sensation is intact.     Motor: Motor function is intact.     Coordination: Coordination is intact.  Psychiatric:        Attention and Perception: Attention normal.         Mood and Affect: Mood normal.        Speech: Speech normal.        Behavior: Behavior normal.     ED Results / Procedures / Treatments   Labs (all labs ordered are listed, but only abnormal results are displayed) Labs Reviewed  COMPREHENSIVE METABOLIC PANEL - Abnormal; Notable for the following components:      Result Value   Glucose, Bld 102 (*)    Calcium 8.6 (*)    Total Protein 6.2 (*)    Albumin 3.3 (*)    All other components within normal limits  LIPASE, BLOOD  CBC WITH DIFFERENTIAL/PLATELET  URINALYSIS, ROUTINE W REFLEX MICROSCOPIC    EKG None  Radiology No results found.  Procedures Procedures    Medications Ordered in ED Medications  HYDROmorphone (DILAUDID) injection 1 mg (1 mg Intravenous Given 08/18/22 0336)  ondansetron (ZOFRAN) injection 4 mg (4 mg Intravenous Given 08/18/22 0335)    ED Course/ Medical Decision Making/ A&P                           Medical Decision Making Amount and/or Complexity of Data Reviewed Labs: ordered.  Risk Prescription drug management.   Patient experiencing right flank pain.  Pain is severe, right posterior back and radiates to the right groin area.  This is consistent with known small UVJ stone visualized on CT scan 2 days ago.  Patient requiring increased analgesia here in the department.  She has significantly improved after IV Dilaudid.  Patient's diagnosis was clouded by the fact that she had some findings of possible cholecystitis on her CT scan 2 days ago.  At that time she was not experiencing right upper quadrant pain.  Today she does not have right upper quadrant pain.  There is no right upper quadrant tenderness.  LFTs and lipase remain normal.  I do not see any findings of acute cholecystitis and all of her complaints are explained by the known kidney stone.  She was counseled that she does need to schedule follow-up with general surgery as was recommended at the last visit but most important will be to  follow-up with urology.  Given return precautions.        Final Clinical Impression(s) / ED Diagnoses Final diagnoses:  Ureteral colic    Rx / DC Orders ED Discharge Orders          Ordered    Ambulatory referral to Urology  08/18/22 0447    oxyCODONE-acetaminophen (PERCOCET) 5-325 MG tablet  Every 4 hours PRN        08/18/22 0448              Orpah Greek, MD 08/18/22 0451

## 2022-08-18 NOTE — ED Triage Notes (Signed)
Pt c/o continued right sided flank pain since being seen on 10/10 and dx with kidney stones. Pt states she has been taking the pain medication she was ordered but hasn't had time to follow up with urology as instructed.

## 2022-08-18 NOTE — Progress Notes (Signed)
08/18/2022 12:31 PM   Briley Bumgarner 1961/09/01 [id number]  Referring provider: [facility] [address] [place],  [place] [address]  Right flank pain   HPI: Ms Cahn is a 61yo here for right flank pain and nephrolithiasis. She presented to the ER 10/10 and was diagnosed with a 57mm right UVJ calculus. This is her first stone event. She developed right flank pain 10/9. She has nausea and vomiting. No fevers. She has urinary frequency, urgency. NO dysuria. She is on Eliquis. No fevers.    PMH: Past Medical History:  Diagnosis Date   Allergy    seasonal   Anticoagulated on warfarin    off since 2015   Arthritis    ankle- left    Asthma    no problems in years, rarely uses inhaler   Clotting disorder (HCC)    DVT,PE   DVT (deep venous thrombosis) (HCC)    left leg in 2014;  no DVT now     Family history of breast cancer    GERD (gastroesophageal reflux disease)    Heart murmur    pt denies   Hyperlipidemia    PE (pulmonary embolism) 04/2014   gone    Ruptured or perforated eardrum, bilateral    Uses hearing aid    bilateral   Wears partial dentures    upper dentures and lower partial    Surgical History: Past Surgical History:  Procedure Laterality Date   79 HOUR PH STUDY N/A 01/02/2018   Procedure: 24 HOUR PH STUDY;  Surgeon: Napoleon Form, MD;  Location: [facility];  Service: Endoscopy;  Laterality: N/A;   ABDOMINAL HYSTERECTOMY     APPENDECTOMY     COLONOSCOPY     ESOPHAGEAL MANOMETRY N/A 01/02/2018   Procedure: ESOPHAGEAL MANOMETRY (EM);  Surgeon: Napoleon Form, MD;  Location: [facility];  Service: Endoscopy;  Laterality: N/A;   ESOPHAGOGASTRODUODENOSCOPY (EGD) WITH ESOPHAGEAL DILATION     x6   FOOT SURGERY Bilateral    BONE SPURS   KNEE SURGERY Right 2011   arthroscopy ; in [place]    NISSEN FUNDOPLICATION  03/2018   OPEN REDUCTION INTERNAL FIXATION (ORIF) DISTAL RADIAL FRACTURE Right 12/02/2014    Procedure: OPEN REDUCTION INTERNAL FIXATION (ORIF) DISTAL RADIAL FRACTURE;  Surgeon: Eldred Manges, MD;  Location: [facility];  Service: Orthopedics;  Laterality: Right;   UPPER GASTROINTESTINAL ENDOSCOPY     WRIST SURGERY     "broke radius bone and theres plates and screws there now ;  Dr Ophelia Charter did"     Home Medications:  Allergies as of 08/18/2022       Reactions   Rivaroxaban Nausea Only   Other reaction(s): Nausea   Chocolate Diarrhea   Coumadin [warfarin]    Dabigatran Etexilate Mesylate    Other reaction(s): Nausea   Lactose Intolerance (gi) Diarrhea   Meat [alpha-gal] Nausea Only, Other (See Comments)   And stomach pain   Other    Other reaction(s): Sick   Pradaxa [dabigatran Etexilate Mesylate] Nausea Only        Medication List        Accurate as of August 18, 2022 12:31 PM. If you have any questions, ask your nurse or doctor.          acyclovir ointment 5 % Commonly known as: ZOVIRAX Apply topically 3 (three) times daily as needed.   Eliquis 5 MG Tabs tablet Generic drug: apixaban TAKE ONE TABLET BY MOUTH  TWICE DAILY   lidocaine 5 % ointment Commonly known as: XYLOCAINE Apply 1 application. topically as needed.   ondansetron 4 MG tablet Commonly known as: ZOFRAN Take 1 tablet (4 mg total) by mouth every 6 (six) hours.   oxyCODONE-acetaminophen 5-325 MG tablet Commonly known as: PERCOCET/ROXICET Take 1 tablet by mouth every 4 (four) hours as needed.   oxyCODONE-acetaminophen 5-325 MG tablet Commonly known as: Percocet Take 1-2 tablets by mouth every 4 (four) hours as needed.   tamsulosin 0.4 MG Caps capsule Commonly known as: FLOMAX Take 1 capsule (0.4 mg total) by mouth daily.   Vitamin D3 125 MCG (5000 UT) Caps Take by mouth.        Allergies:  Allergies  Allergen Reactions   Rivaroxaban Nausea Only    Other reaction(s): Nausea   Chocolate Diarrhea   Coumadin [Warfarin]    Dabigatran Etexilate Mesylate     Other reaction(s): Nausea    Lactose Intolerance (Gi) Diarrhea   Meat [Alpha-Gal] Nausea Only and Other (See Comments)    And stomach pain   Other     Other reaction(s): Sick   Pradaxa [Dabigatran Etexilate Mesylate] Nausea Only    Family History: Family History  Problem Relation Age of Onset   Breast cancer Maternal Grandmother 32       deceased 45s   Diabetes Maternal Grandmother    Heart disease Father        PACEMAKER   Cancer Father        penile cancer; currently 4   Heart disease Mother    Dementia Mother        deceased 82   Transient ischemic attack Mother    Kidney failure Brother    Suicidality Brother    Breast cancer Sister 49       currently 6   Lung cancer Sister    Esophageal cancer Maternal Uncle    Colon cancer Neg Hx    Colon polyps Neg Hx    Rectal cancer Neg Hx    Stomach cancer Neg Hx     Social History:  reports that she quit smoking about 30 years ago. Her smoking use included cigarettes. She smoked an average of 20 packs per day. She has never used smokeless tobacco. She reports that she does not drink alcohol and does not use drugs.  ROS: All other review of systems were reviewed and are negative except what is noted above in HPI  Physical Exam: BP (!) 151/76   Pulse 86   Constitutional:  Alert and oriented, No acute distress. HEENT: [place] AT, moist mucus membranes.  Trachea midline, no masses. Cardiovascular: No clubbing, cyanosis, or edema. Respiratory: Normal respiratory effort, no increased work of breathing. GI: Abdomen is soft, nontender, nondistended, no abdominal masses GU: No CVA tenderness.  Lymph: No cervical or inguinal lymphadenopathy. Skin: No rashes, bruises or suspicious lesions. Neurologic: Grossly intact, no focal deficits, moving all 4 extremities. Psychiatric: Normal mood and affect.  Laboratory Data: Lab Results  Component Value Date   WBC 3.3 (L) 08/18/2022   HGB 10.1 (L) 08/18/2022   HCT 30.7 (L) 08/18/2022   MCV 90.6 08/18/2022   PLT  96 (L) 08/18/2022    Lab Results  Component Value Date   CREATININE 0.85 08/18/2022    No results found for: "PSA"  No results found for: "TESTOSTERONE"  No results found for: "HGBA1C"  Urinalysis    Component Value Date/Time   COLORURINE YELLOW 08/15/2022 1822   APPEARANCEUR HAZY (  A) 08/15/2022 1822   LABSPEC 1.005 08/15/2022 1822   PHURINE 6.0 08/15/2022 1822   GLUCOSEU NEGATIVE 08/15/2022 1822   HGBUR LARGE (A) 08/15/2022 1822   BILIRUBINUR NEGATIVE 08/15/2022 1822   BILIRUBINUR negative 12/16/2014 1708   KETONESUR NEGATIVE 08/15/2022 1822   PROTEINUR NEGATIVE 08/15/2022 1822   UROBILINOGEN negative 12/16/2014 1708   NITRITE NEGATIVE 08/15/2022 1822   LEUKOCYTESUR NEGATIVE 08/15/2022 1822    Lab Results  Component Value Date   MUCUS neg 12/16/2014   BACTERIA RARE (A) 08/15/2022    Pertinent Imaging: CT 08/15/2022: Images reviewed and discussed with the patient  No results found for this or any previous visit.  No results found for this or any previous visit.  No results found for this or any previous visit.  No results found for this or any previous visit.  No results found for this or any previous visit.  No valid procedures specified. No results found for this or any previous visit.  Results for orders placed during the hospital encounter of 08/15/22  CT Renal Stone Study  Narrative CLINICAL DATA:  Right flank pain  EXAM: CT ABDOMEN AND PELVIS WITHOUT CONTRAST  TECHNIQUE: Multidetector CT imaging of the abdomen and pelvis was performed following the standard protocol without IV contrast.  RADIATION DOSE REDUCTION: This exam was performed according to the departmental dose-optimization program which includes automated exposure control, adjustment of the mA and/or kV according to patient size and/or use of iterative reconstruction technique.  COMPARISON:  08/06/2018  FINDINGS: Lower chest: There is no focal consolidation in the lower  lung fields. Coronary artery calcifications are seen.  Hepatobiliary: No focal abnormalities are seen in liver. There is no dilation of bile ducts. Gallbladder is distended. There is wall thickening in gallbladder. There is pericholecystic fluid. There is layering of sludge and tiny gallbladder stones.  Pancreas: No focal abnormalities are seen.  Spleen: Spleen measures 15 cm in maximum diameter.  Adrenals/Urinary Tract: Adrenals are unremarkable. There is mild to moderate right hydronephrosis. Right ureter is dilated. There is 2 mm calculus in the distal right ureter close to the ureterovesical junction. There is no hydronephrosis in the left kidney. There are no renal stones. Urinary bladder is not distended. There is tiny pocket of air in the bladder, possibly due to recent instrumentation. Less likely possibility would be cystitis.  Stomach/Bowel: Stomach is not distended. There is possible previous intervention such as fundoplication. Small bowel loops are unremarkable. Appendix is not seen. There is no significant wall thickening in colon. Scattered diverticula are seen in colon without signs of focal acute diverticulitis.  Vascular/Lymphatic: Scattered arterial calcifications are seen. 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  Reproductive: Uterus is not seen.  Other: There is no pneumoperitoneum. Small amount of ascites is seen in right lower quadrant. Small umbilical hernia containing fat is seen.  Musculoskeletal: Degenerative changes are noted in lumbar spine, more so at L5-S1 level. Degenerative changes are noted in the left both hips.  IMPRESSION: There is mild-to-moderate right hydronephrosis. There is 2 mm calculus in the distal right ureter close to the ureterovesical junction causing partial obstruction. There is small pocket of air in the lumen of the urinary bladder, possibly due to recent catheterization. Less  likely possibility would be cystitis.  There is wall thickening in gallbladder. There is pericholecystic stranding. Layering stones are noted in the gallbladder. Findings suggest possible acute cholecystitis. Gallbladder sonogram as clinically warranted should be considered. Small amount of free fluid  is seen in right lower quadrant of abdomen which may be related to acute cholecystitis.  Enlarged spleen. There is mild diffuse stranding in mesenteric fat suggesting acute or chronic mesenteritis. Diverticulosis of colon without signs of focal diverticulitis.  Other findings as described in the body of the report. 6   Electronically Signed By: Elmer Picker M.D. On: 08/15/2022 17:29   Assessment & Plan:    1. Kidney stones -We discussed the management of kidney stones. These options include observation, ureteroscopy, shockwave lithotripsy (ESWL) and percutaneous nephrolithotomy (PCNL). We discussed which options are relevant to the patient's stone(s). We discussed the natural history of kidney stones as well as the complications of untreated stones and the impact on quality of life without treatment as well as with each of the above listed treatments. We also discussed the efficacy of each treatment in its ability to clear the stone burden. With any of these management options I discussed the signs and symptoms of infection and the need for emergent treatment should these be experienced. For each option we discussed the ability of each procedure to clear the patient of their stone burden.   For observation I described the risks which include but are not limited to silent renal damage, life-threatening infection, need for emergent surgery, failure to pass stone and pain.   For ureteroscopy I described the risks which include bleeding, infection, damage to contiguous structures, positioning injury, ureteral stricture, ureteral avulsion, ureteral injury, need for prolonged ureteral  stent, inability to perform ureteroscopy, need for an interval procedure, inability to clear stone burden, stent discomfort/pain, heart attack, stroke, pulmonary embolus and the inherent risks with general anesthesia.   For shockwave lithotripsy I described the risks which include arrhythmia, kidney contusion, kidney hemorrhage, need for transfusion, pain, inability to adequately break up stone, inability to pass stone fragments, Steinstrasse, infection associated with obstructing stones, need for alternate surgical procedure, need for repeat shockwave lithotripsy, MI, CVA, PE and the inherent risks with anesthesia/conscious sedation.   For PCNL I described the risks including positioning injury, pneumothorax, hydrothorax, need for chest tube, inability to clear stone burden, renal laceration, arterial venous fistula or malformation, need for embolization of kidney, loss of kidney or renal function, need for repeat procedure, need for prolonged nephrostomy tube, ureteral avulsion, MI, CVA, PE and the inherent risks of general anesthesia.   - The patient would like to proceed with medical expulsive therapy.  - POCT urinalysis dipstick   No follow-ups on file.  Nicolette Bang, MD  [facility] [place]

## 2022-08-18 NOTE — Patient Instructions (Signed)

## 2022-08-21 ENCOUNTER — Ambulatory Visit (INDEPENDENT_AMBULATORY_CARE_PROVIDER_SITE_OTHER): Payer: Federal, State, Local not specified - PPO | Admitting: Physician Assistant

## 2022-08-21 VITALS — BP 105/70 | HR 83 | Wt 174.0 lb

## 2022-08-21 DIAGNOSIS — N201 Calculus of ureter: Secondary | ICD-10-CM | POA: Diagnosis not present

## 2022-08-21 DIAGNOSIS — N200 Calculus of kidney: Secondary | ICD-10-CM

## 2022-08-21 DIAGNOSIS — N132 Hydronephrosis with renal and ureteral calculous obstruction: Secondary | ICD-10-CM

## 2022-08-21 DIAGNOSIS — N1330 Unspecified hydronephrosis: Secondary | ICD-10-CM | POA: Diagnosis not present

## 2022-08-21 LAB — MICROSCOPIC EXAMINATION: RBC, Urine: NONE SEEN /hpf (ref 0–2)

## 2022-08-21 LAB — URINALYSIS, ROUTINE W REFLEX MICROSCOPIC
Bilirubin, UA: NEGATIVE
Glucose, UA: NEGATIVE
Ketones, UA: NEGATIVE
Leukocytes,UA: NEGATIVE
Nitrite, UA: NEGATIVE
Protein,UA: NEGATIVE
Specific Gravity, UA: 1.01 (ref 1.005–1.030)
Urobilinogen, Ur: 1 mg/dL (ref 0.2–1.0)
pH, UA: 7 (ref 5.0–7.5)

## 2022-08-21 NOTE — Progress Notes (Signed)
Assessment: 1. Kidney stones - Urinalysis, Routine w reflex microscopic - Ultrasound renal complete; Future  2. Hydronephrosis with urinary obstruction due to ureteral calculus    Plan: Stone prevention and diet again discussed. Pt will continue Flomax until current Rx complete. If she develops pain again, will need KUB before OV. Otherwise, will see her in 6 weeks after renal US to ensure hydro has resolved.    Chief Complaint: No chief complaint on file.   HPI: Whitney Peters is a 61 y.o. female who presents for continued evaluation of R sided UVJ stone. She has not seen it pass, but pain and nausea have resolved. She did not strain her urine. No LUTs. No gross hematuria.   UA=clear  08/18/22 Whitney Peters is a 61yo here for right flank pain and nephrolithiasis. She presented to the ER 10/10 and was diagnosed with a 66mm right UVJ calculus. This is her first stone event. She developed right flank pain 10/9. She has nausea and vomiting. No fevers. She has urinary frequency, urgency. NO dysuria. She is on Eliquis. No fevers.   Portions of the above documentation were copied from a prior visit for review purposes only.  Allergies: Allergies  Allergen Reactions   Rivaroxaban Nausea Only    Other reaction(s): Nausea   Chocolate Diarrhea   Coumadin [Warfarin]    Dabigatran Etexilate Mesylate     Other reaction(s): Nausea   Lactose Intolerance (Gi) Diarrhea   Meat [Alpha-Gal] Nausea Only and Other (See Comments)    And stomach pain   Other     Other reaction(s): Sick   Pradaxa [Dabigatran Etexilate Mesylate] Nausea Only    PMH: Past Medical History:  Diagnosis Date   Allergy    seasonal   Anticoagulated on warfarin    off since 2015   Arthritis    ankle- left    Asthma    no problems in years, rarely uses inhaler   Clotting disorder (HCC)    DVT,PE   DVT (deep venous thrombosis) (HCC)    left leg in 2014;  no DVT now     Family history of breast cancer     GERD (gastroesophageal reflux disease)    Heart murmur    pt denies   Hyperlipidemia    PE (pulmonary embolism) 04/2014   gone    Ruptured or perforated eardrum, bilateral    Uses hearing aid    bilateral   Wears partial dentures    upper dentures and lower partial    PSH: Past Surgical History:  Procedure Laterality Date   5 HOUR [place] STUDY N/A 01/02/2018   Procedure: 24 HOUR PH STUDY;  Surgeon: Whitney Pole, MD;  [facility]: [facility];  Service: Endoscopy;  Laterality: N/A;   ABDOMINAL HYSTERECTOMY     APPENDECTOMY     COLONOSCOPY     ESOPHAGEAL MANOMETRY N/A 01/02/2018   Procedure: ESOPHAGEAL MANOMETRY (EM);  Surgeon: Whitney Pole, MD;  Location: [facility];  Service: Endoscopy;  Laterality: N/A;   ESOPHAGOGASTRODUODENOSCOPY (EGD) WITH ESOPHAGEAL DILATION     x6   FOOT SURGERY Bilateral    BONE SPURS   KNEE SURGERY Right 2011   arthroscopy ; in [place]  [id number]   OPEN REDUCTION INTERNAL FIXATION (ORIF) DISTAL RADIAL FRACTURE Right 12/02/2014   Procedure: OPEN REDUCTION INTERNAL FIXATION (ORIF) DISTAL RADIAL FRACTURE;  Surgeon: Whitney Killings, MD;  Location: [place];  Service: Orthopedics;  Laterality: Right;   UPPER GASTROINTESTINAL  ENDOSCOPY     WRIST SURGERY     "broke radius bone and theres plates and screws there now ;  Whitney Peters did"     SH: Social History   Tobacco Use   Smoking status: Former    Packs/day: 20.00    Types: Cigarettes    Quit date: 11/07/1991    Years since quitting: 30.8   Smokeless tobacco: Never  Vaping Use   Vaping Use: Never used  Substance Use Topics   Alcohol use: No    Alcohol/week: 0.0 standard drinks of alcohol   Drug use: No    ROS: All other review of systems were reviewed and are negative except what is noted above in HPI  PE: BP 105/70   Pulse 83   Wt 174 lb (78.9 kg)   BMI 25.70 kg/m  GENERAL APPEARANCE:  Well appearing, well developed, well nourished, NAD HEENT:  Atraumatic,  normocephalic NECK:  Supple. Trachea midline ABDOMEN:  Soft, non-tender, no masses EXTREMITIES:  Moves all extremities well, without clubbing, cyanosis, or edema NEUROLOGIC:  Alert and oriented x 3, normal gait, CN II-XII grossly intact MENTAL STATUS:  appropriate BACK:  Non-tender to palpation, No CVAT SKIN:  Warm, dry, and intact   Results: Laboratory Data: Lab Results  Component Value Date   WBC 3.3 (L) 08/18/2022   HGB 10.1 (L) 08/18/2022   HCT 30.7 (L) 08/18/2022   MCV 90.6 08/18/2022   PLT 96 (L) 08/18/2022    Lab Results  Component Value Date   CREATININE 0.85 08/18/2022    No results found for: "PSA"  No results found for: "TESTOSTERONE"  No results found for: "HGBA1C"  Urinalysis    Component Value Date/Time   COLORURINE YELLOW 08/15/2022 1822   APPEARANCEUR HAZY (A) 08/15/2022 1822   LABSPEC 1.005 08/15/2022 1822   PHURINE 6.0 08/15/2022 1822   GLUCOSEU NEGATIVE 08/15/2022 1822   HGBUR LARGE (A) 08/15/2022 1822   BILIRUBINUR 1+ 08/18/2022 1242   KETONESUR NEGATIVE 08/15/2022 1822   PROTEINUR Positive (A) 08/18/2022 1242   PROTEINUR NEGATIVE 08/15/2022 1822   UROBILINOGEN 1.0 08/18/2022 1242   NITRITE neg 08/18/2022 1242   NITRITE NEGATIVE 08/15/2022 1822   LEUKOCYTESUR Negative 08/18/2022 1242   LEUKOCYTESUR NEGATIVE 08/15/2022 1822    Lab Results  Component Value Date   MUCUS neg 12/16/2014   BACTERIA RARE (A) 08/15/2022    Pertinent Imaging: No results found for this or any previous visit.  No results found for this or any previous visit.  No results found for this or any previous visit.  No results found for this or any previous visit.  No results found for this or any previous visit.  No valid procedures specified. No results found for this or any previous visit.  Results for orders placed during the hospital encounter of 08/15/22  CT Renal Stone Study  Narrative CLINICAL DATA:  Right flank pain  EXAM: CT ABDOMEN AND PELVIS  WITHOUT CONTRAST  TECHNIQUE: Multidetector CT imaging of the abdomen and pelvis was performed following the standard protocol without IV contrast.  RADIATION DOSE REDUCTION: This exam was performed according to the departmental dose-optimization program which includes automated exposure control, adjustment of the mA and/or kV according to patient size and/or use of iterative reconstruction technique.  COMPARISON:  08/06/2018  FINDINGS: Lower chest: There is no focal consolidation in the lower lung fields. Coronary artery calcifications are seen.  Hepatobiliary: No focal abnormalities are seen in liver. There is no dilation of bile ducts.  Gallbladder is distended. There is wall thickening in gallbladder. There is pericholecystic fluid. There is layering of sludge and tiny gallbladder stones.  Pancreas: No focal abnormalities are seen.  Spleen: Spleen measures 15 cm in maximum diameter.  Adrenals/Urinary Tract: Adrenals are unremarkable. There is mild to moderate right hydronephrosis. Right ureter is dilated. There is 2 mm calculus in the distal right ureter close to the ureterovesical junction. There is no hydronephrosis in the left kidney. There are no renal stones. Urinary bladder is not distended. There is tiny pocket of air in the bladder, possibly due to recent instrumentation. Less likely possibility would be cystitis.  Stomach/Bowel: Stomach is not distended. There is possible previous intervention such as fundoplication. Small bowel loops are unremarkable. Appendix is not seen. There is no significant wall thickening in colon. Scattered diverticula are seen in colon without signs of focal acute diverticulitis.  Vascular/Lymphatic: Scattered arterial calcifications are seen. 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  Reproductive: Uterus is not seen.  Other: There is no pneumoperitoneum. Small amount of ascites is seen in  right lower quadrant. Small umbilical hernia containing fat is seen.  Musculoskeletal: Degenerative changes are noted in lumbar spine, more so at L5-S1 level. Degenerative changes are noted in the left both hips.  IMPRESSION: There is mild-to-moderate right hydronephrosis. There is 2 mm calculus in the distal right ureter close to the ureterovesical junction causing partial obstruction. There is small pocket of air in the lumen of the urinary bladder, possibly due to recent catheterization. Less likely possibility would be cystitis.  There is wall thickening in gallbladder. There is pericholecystic stranding. Layering stones are noted in the gallbladder. Findings suggest possible acute cholecystitis. Gallbladder sonogram as clinically warranted should be considered. Small amount of free fluid is seen in right lower quadrant of abdomen which may be related to acute cholecystitis.  Enlarged spleen. There is mild diffuse stranding in mesenteric fat suggesting acute or chronic mesenteritis. Diverticulosis of colon without signs of focal diverticulitis.  Other findings as described in the body of the report. 6   Electronically Signed By: Elmer Picker M.D. On: 08/15/2022 17:29  No results found for this or any previous visit (from the past 24 hour(s)).

## 2022-08-21 NOTE — Patient Instructions (Signed)
Continue Tamsulosin 

## 2022-08-22 LAB — URINE CULTURE

## 2022-08-25 ENCOUNTER — Ambulatory Visit (HOSPITAL_COMMUNITY)
Admission: RE | Admit: 2022-08-25 | Discharge: 2022-08-25 | Disposition: A | Discharge status: Home / Self Care | Payer: Federal, State, Local not specified - PPO | Source: Ambulatory Visit | Attending: Physician Assistant | Admitting: Physician Assistant

## 2022-08-25 DIAGNOSIS — N200 Calculus of kidney: Secondary | ICD-10-CM | POA: Insufficient documentation

## 2022-08-25 DIAGNOSIS — N1330 Unspecified hydronephrosis: Secondary | ICD-10-CM | POA: Diagnosis not present

## 2022-08-28 ENCOUNTER — Encounter: Payer: Self-pay | Admitting: Urology

## 2022-08-29 ENCOUNTER — Other Ambulatory Visit: Payer: Self-pay | Admitting: Hematology

## 2022-08-29 DIAGNOSIS — I81 Portal vein thrombosis: Secondary | ICD-10-CM

## 2022-09-01 ENCOUNTER — Other Ambulatory Visit: Payer: Self-pay | Admitting: Physician Assistant

## 2022-09-01 DIAGNOSIS — N132 Hydronephrosis with renal and ureteral calculous obstruction: Secondary | ICD-10-CM

## 2022-09-01 DIAGNOSIS — N200 Calculus of kidney: Secondary | ICD-10-CM

## 2022-09-14 ENCOUNTER — Ambulatory Visit: Payer: Federal, State, Local not specified - PPO | Admitting: Physician Assistant

## 2022-10-04 ENCOUNTER — Ambulatory Visit: Payer: Federal, State, Local not specified - PPO | Admitting: Physician Assistant

## 2022-11-27 ENCOUNTER — Telehealth: Payer: Self-pay | Admitting: *Deleted

## 2022-11-27 ENCOUNTER — Encounter: Payer: Self-pay | Admitting: *Deleted

## 2022-11-27 NOTE — Patient Outreach (Signed)
  Care Coordination   Initial Visit Note   11/27/2022 Name: Whitney Peters MRN: [id number] DOB: Dec 30, 1960  Whitney Peters is a 62 y.o. year old female who sees [place] [place] for primary care. I spoke with  Caro Hight by phone today.  What matters to the patients health and wellness today?  No needs    Goals Addressed             This Visit's Progress    COMPLETED: care coordination activity       Care Coordination Interventions: Reviewed medications with patient and discussed adherence with all medications provided by new providers. Assessed social determinant of health barriers Educated on care management services with no needs presented. Reported has [place] of [place] with new providers at this time.         SDOH assessments and interventions completed:  Yes  SDOH Interventions Today    Flowsheet Row Most Recent Value  SDOH Interventions   Food Insecurity Interventions Intervention Not Indicated  Housing Interventions Intervention Not Indicated  Transportation Interventions Intervention Not Indicated  Utilities Interventions Intervention Not Indicated        Care Coordination Interventions:  Yes, provided   Follow up plan: No further intervention required.   Encounter Outcome:  Pt. Visit Completed   Raina Mina, RN Care Management Coordinator [place] Office [phone]

## 2022-11-27 NOTE — Patient Instructions (Signed)
Visit Information  Thank you for taking time to visit with me today. Please don't hesitate to contact me if I can be of assistance to you.   Following are the goals we discussed today:   Goals Addressed             This Visit's Progress    COMPLETED: care coordination activity       Care Coordination Interventions: Reviewed medications with patient and discussed adherence with all medications provided by new providers. Assessed social determinant of health barriers Educated on care management services with no needs presented. Reported [place] new providers at this time.        Please call the care guide team at [phone] if you need to cancel or reschedule your appointment.   If you are experiencing a Mental Health or [place] or need someone to talk to, please call the Suicide and Crisis Lifeline: [phone]  Patient verbalizes understanding of instructions and care plan provided today and agrees to view in [place]. Active MyChart status and patient understanding of how to access instructions and care plan via MyChart confirmed with patient.     No further follow up required: No needs  Raina Mina, RN Care Management Coordinator [place] Office [phone]

## 2023-02-15 ENCOUNTER — Other Ambulatory Visit: Payer: Federal, State, Local not specified - PPO

## 2023-02-22 ENCOUNTER — Ambulatory Visit: Payer: Federal, State, Local not specified - PPO | Admitting: Physician Assistant
